# Patient Record
Sex: Female | Born: 1980 | Race: White | Hispanic: No | Marital: Married | Smoking: Never smoker
Health system: Southern US, Community
[De-identification: ages and names within clinical notes are randomized; demographics above are authoritative.]

## PROBLEM LIST (undated history)

## (undated) ENCOUNTER — Inpatient Hospital Stay: Payer: Self-pay

## (undated) DIAGNOSIS — Z8489 Family history of other specified conditions: Secondary | ICD-10-CM

## (undated) DIAGNOSIS — T8859XA Other complications of anesthesia, initial encounter: Secondary | ICD-10-CM

## (undated) DIAGNOSIS — J45909 Unspecified asthma, uncomplicated: Secondary | ICD-10-CM

## (undated) DIAGNOSIS — L723 Sebaceous cyst: Secondary | ICD-10-CM

## (undated) DIAGNOSIS — Z832 Family history of diseases of the blood and blood-forming organs and certain disorders involving the immune mechanism: Secondary | ICD-10-CM

## (undated) DIAGNOSIS — T4145XA Adverse effect of unspecified anesthetic, initial encounter: Secondary | ICD-10-CM

## (undated) DIAGNOSIS — G4730 Sleep apnea, unspecified: Secondary | ICD-10-CM

## (undated) DIAGNOSIS — D649 Anemia, unspecified: Secondary | ICD-10-CM

## (undated) DIAGNOSIS — E282 Polycystic ovarian syndrome: Secondary | ICD-10-CM

## (undated) DIAGNOSIS — E039 Hypothyroidism, unspecified: Secondary | ICD-10-CM

## (undated) DIAGNOSIS — R011 Cardiac murmur, unspecified: Secondary | ICD-10-CM

## (undated) DIAGNOSIS — K219 Gastro-esophageal reflux disease without esophagitis: Secondary | ICD-10-CM

## (undated) HISTORY — DX: Family history of diseases of the blood and blood-forming organs and certain disorders involving the immune mechanism: Z83.2

## (undated) HISTORY — DX: Sebaceous cyst: L72.3

## (undated) HISTORY — PX: WISDOM TOOTH EXTRACTION: SHX21

## (undated) HISTORY — DX: Morbid (severe) obesity due to excess calories: E66.01

## (undated) HISTORY — DX: Cardiac murmur, unspecified: R01.1

---

## 2008-12-25 HISTORY — PX: CYST EXCISION: SHX5701

## 2009-05-19 ENCOUNTER — Ambulatory Visit: Payer: Self-pay | Admitting: General Surgery

## 2009-05-27 ENCOUNTER — Ambulatory Visit: Payer: Self-pay | Admitting: General Surgery

## 2013-11-17 LAB — HM PAP SMEAR: HM Pap smear: NEGATIVE

## 2014-05-02 ENCOUNTER — Observation Stay: Payer: Self-pay | Admitting: Obstetrics and Gynecology

## 2014-05-04 ENCOUNTER — Encounter: Payer: Self-pay | Admitting: Obstetrics & Gynecology

## 2014-05-04 DIAGNOSIS — Z832 Family history of diseases of the blood and blood-forming organs and certain disorders involving the immune mechanism: Secondary | ICD-10-CM

## 2014-05-04 HISTORY — DX: Family history of diseases of the blood and blood-forming organs and certain disorders involving the immune mechanism: Z83.2

## 2014-06-26 ENCOUNTER — Observation Stay: Payer: Self-pay | Admitting: Emergency Medicine

## 2014-07-17 ENCOUNTER — Inpatient Hospital Stay: Payer: Self-pay

## 2014-07-17 LAB — PIH PROFILE
Anion Gap: 9 (ref 7–16)
BUN: 9 mg/dL (ref 7–18)
CALCIUM: 8.2 mg/dL — AB (ref 8.5–10.1)
CHLORIDE: 108 mmol/L — AB (ref 98–107)
CREATININE: 0.6 mg/dL (ref 0.60–1.30)
Co2: 22 mmol/L (ref 21–32)
EGFR (African American): >60
EGFR (Non-African Amer.): >60
GLUCOSE: 78 mg/dL (ref 65–99)
HCT: 32.7 % — ABNORMAL LOW (ref 35.0–47.0)
HGB: 10.8 g/dL — ABNORMAL LOW (ref 12.0–16.0)
MCH: 30.6 pg (ref 26.0–34.0)
MCHC: 33.1 g/dL (ref 32.0–36.0)
MCV: 93 fL (ref 80–100)
OSMOLALITY: 275 (ref 275–301)
Platelet: 225 10*3/uL (ref 150–440)
Potassium: 4 mmol/L (ref 3.5–5.1)
RBC: 3.53 10*6/uL — ABNORMAL LOW (ref 3.80–5.20)
RDW: 13.5 % (ref 11.5–14.5)
SGOT(AST): 15 U/L (ref 15–37)
SODIUM: 139 mmol/L (ref 136–145)
URIC ACID: 3.9 mg/dL (ref 2.6–6.0)
WBC: 12.6 10*3/uL — ABNORMAL HIGH (ref 3.6–11.0)

## 2014-07-17 LAB — PROTEIN / CREATININE RATIO, URINE
CREATININE, URINE: 129 mg/dL — AB (ref 30.0–125.0)
PROTEIN, RANDOM URINE: 42 mg/dL — AB (ref 0–12)
Protein/Creat. Ratio: 326 mg/gCREAT — ABNORMAL HIGH (ref 0–200)

## 2014-07-19 LAB — HEMATOCRIT: HCT: 30.2 % — AB (ref 35.0–47.0)

## 2015-04-06 ENCOUNTER — Encounter: Payer: Self-pay | Admitting: *Deleted

## 2015-05-04 NOTE — H&P (Signed)
L&D Evaluation:  History:  HPI [age] year old G1 at 2442w1d by D=7wk US derived EDC of 07/17/2014 presenting with postcoital spotting at 16:00 today, no further bleeding since.  Patient is Rh positive, posterior placenta, no pain with bleeding, positive fetal movement.  Denies contraction but some occasional cramping in the past few weeks.  No LOF.   Presents with vaginal bleeding   Patient's Medical History History of hypothyroidism   Patient's Surgical History cyst removal from head, wisdom tooth extraction   Medications Pre Natal Vitamins   Allergies PCN, Sulfa, levaquin   Social History none   Family History Non-Contributory   ROS:  ROS All systems were reviewed.  HEENT, CNS, GI, GU, Respiratory, CV, Renal and Musculoskeletal systems were found to be normal.   Exam:  Vital Signs stable   Urine Protein not completed   General no apparent distress   Mental Status clear   Chest no increased work of breathing   Abdomen gravid, non-tender   Estimated Fetal Weight Average for gestational age   Edema no edema   Pelvic no external lesions, cervix closed and thick, no blood on examining glove   Mebranes Intact   FHT 130, moderate, +accels, no decels   Ucx absent   Impression:  Impression 34 year old G1 at 6742w1d with ostcoital spotting   Plan:  Plan EFM/NST   Comments - reassuring fetal monitoring/reactive NST by <32 week criteria/category I tracing - no fundal tenderness - Rh positive rhogam not indicated - Follow up in place 05/04/14   Follow Up Appointment already scheduled   Electronic Signatures: Lorrene ReidStaebler, Lace Chenevert M (MD)  (Signed 09-May-15 19:17)  Authored: L&D Evaluation   Last Updated: 09-May-15 19:17 by Lorrene ReidStaebler, Adriaan Maltese M (MD)

## 2015-05-04 NOTE — H&P (Signed)
L&D Evaluation:  History:  HPI 34 year old G1 at 4357w0d by D=7wk US derived EDC of 07/17/2014 presenting with pwith contractions.  No LOF, no VB, positive fetal movement.   Presents with contractions   Patient's Medical History History of hypothyroidism   Patient's Surgical History cyst removal from head, wisdom tooth extraction   Medications Pre Natal Vitamins   Allergies PCN, Sulfa, levaquin   Social History none   Family History Non-Contributory   ROS:  ROS All systems were reviewed.  HEENT, CNS, GI, GU, Respiratory, CV, Renal and Musculoskeletal systems were found to be normal.   Exam:  Vital Signs stable   Urine Protein not completed   General no apparent distress   Mental Status clear   Chest no increased work of breathing   Abdomen gravid, non-tender   Estimated Fetal Weight Average for gestational age   Edema no edema   Pelvic no external lesions, 1/90/-3   Mebranes Intact   Ucx irregular, 5-437min   Impression:  Impression 34 year old G1 at 5357w0d with braxton hick contractions   Plan:  Plan EFM/NST   Comments - reassuring fetal monitoring/reactive NST  - No cervical change over prolonged rule out - follow up in place next week   Follow Up Appointment already scheduled   Electronic Signatures: Lorrene ReidStaebler, Chanika Byland M (MD)  (Signed 03-Jul-15 14:25)  Authored: L&D Evaluation   Last Updated: 03-Jul-15 14:25 by Lorrene ReidStaebler, Verenice Westrich M (MD)

## 2015-05-04 NOTE — H&P (Signed)
L&D Evaluation:  History Expanded:  HPI 34 yo G1 at 57w0dgestational age.  Her preggnancy has been complicated by obesity with BMI >40, she has a family history of hemophilia in her maternal grandfather and her sister's two sons have Christmas disease and hemophilia A.  She presents after being seen in the office today with elevated blood pressures in the mild range.  She has a questionable history of chronic hypertension (she had a BP of 142/80 at 15 weeks).  She has a mild headache, denies visual disturbances, and RUQ pain.  She notes positive fetal movement, no leakage of fluid, mild occasional contractions, and vaginal spotting after having her membranes swept in the office today.  [facility]   Blood Type (Maternal) A positive   Maternal HIV Negative   Maternal Syphilis Ab Nonreactive   Maternal Varicella Non-Immune   Rubella Results ([facility]aternal) immune   [facility]   Patient's Medical History Asthma   Patient's Surgical History Multiple cysts removed from head, wisdom teeth extraction    Medications Pre Natal Vitamins    Allergies PCN, Sulfa, levofloxacin   Social History none    Family History See HPI    ROS:  ROS All systems were reviewed.  HEENT, CNS, GI, GU, Respiratory, CV, Renal and Musculoskeletal systems were found to be normal., unless noted in HPI   Exam:  Vital Signs T98.4, BP 128-168 (single value >160)/ 78-97, P80, RR20    General no apparent distress   Mental Status clear    Chest clear    Heart normal sinus rhythm   Abdomen gravid, non-tender   Estimated Fetal Weight 7.5 pounds   Fetal Position cephalic   Edema no edema    Reflexes 2+    Pelvic 3/80/-2   Mebranes Ruptured   Description green/meconium   FHT normal rate with no decels   FHT Description 125/mod var/+accels/no decels   Ucx irregular   Impression:  Impression 1) Intrauterine pregnancy at 89w0destational age, 2)  preeclampsia without severe features   Plan:  Comments 1) Labor: active management with Induction of labor.  Patient had SROM with moderate meconium.  Will augment her labor with pitocin.   2) Fetus - category I tracing  3) PNL A negative / ABSC negative / RI / VZNI - vaccinate postpartum / HIV neg / RPR NR / HBsAg neg /declined genetic testing / early 1-hr OGTT 162 - 3 hour all values wnl, 3h gtt at 28 wks with all 3 values normal / GBS negative on 06/22/14.  She does have one nephew and one neice through her sister who have hemophilia B (factor IX deficiency, aka Christmas disease).  Patient seen by [facility] fetus has 1 in 8 chance of hemophilia B.   4) TDAP given 05/04/14  5) Disposition - home postpartum   Labs:  Lab Results:  Hepatic:  24-Jul-15 18:12   SGOT (AST) 15 (Result(s) reported on 17 Jul 2014 at [facility]  Routine Chem:  24-Jul-15 18:12   Uric Acid, Serum 3.9 (Result(s) reported on 17 Jul 2014 at [facility]  Glucose, Serum 78  BUN 9  Creatinine (comp) 0.60  Sodium, Serum 139  Potassium, Serum 4.0  Chloride, Serum  108  CO2, Serum 22  Osmolality (calc) 275  Anion Gap 9  Calcium (Total), Serum  8.2  eGFR (Non-African American) >60 (eGFR values <6051min/1.73 m2 may be an indication of chronic kidney disease (CKD). Calculated eGFR is useful in  patients with stable renal function. The eGFR calculation will not be reliable in acutely ill patients when serum creatinine is changing rapidly. It is not useful in  patients on dialysis. The eGFR calculation may not be applicable to patients at the low and high extremes of body sizes, pregnant women, and vegetarians.)  eGFR (African American) >60  Misc Urine Chem:  24-Jul-15 17:38   Creatinine, Urine  129.0  Protein, Random Urine  42  Protein/Creat Ratio (comp)  326 (Result(s) reported on 17 Jul 2014 at 07:36PM.)  Routine Hem:  24-Jul-15 18:12   WBC (CBC)  12.6  RBC (CBC)  3.53  Hemoglobin (CBC)  10.8  Hematocrit  (CBC)  32.7  Platelet Count (CBC) 225 (Result(s) reported on 17 Jul 2014 at [facility].)  MCV 93  MCH 30.6  MCHC 33.1  RDW 13.5   Electronic Signatures: Will Bonnet (MD)  (Signed 25-Jul-15 00:00)  Authored: L&D Evaluation, Labs   Last Updated: 25-Jul-15 00:00 by Will Bonnet (MD)

## 2015-12-20 ENCOUNTER — Emergency Department
Admission: EM | Admit: 2015-12-20 | Discharge: 2015-12-20 | Disposition: A | Discharge status: Home / Self Care | Payer: 59 | Attending: Emergency Medicine | Admitting: Emergency Medicine

## 2015-12-20 ENCOUNTER — Encounter: Payer: Self-pay | Admitting: Emergency Medicine

## 2015-12-20 DIAGNOSIS — L723 Sebaceous cyst: Secondary | ICD-10-CM | POA: Insufficient documentation

## 2015-12-20 DIAGNOSIS — Z79899 Other long term (current) drug therapy: Secondary | ICD-10-CM | POA: Diagnosis not present

## 2015-12-20 DIAGNOSIS — Z880 Allergy status to penicillin: Secondary | ICD-10-CM | POA: Insufficient documentation

## 2015-12-20 DIAGNOSIS — L089 Local infection of the skin and subcutaneous tissue, unspecified: Secondary | ICD-10-CM

## 2015-12-20 DIAGNOSIS — E039 Hypothyroidism, unspecified: Secondary | ICD-10-CM | POA: Diagnosis not present

## 2015-12-20 DIAGNOSIS — E282 Polycystic ovarian syndrome: Secondary | ICD-10-CM | POA: Insufficient documentation

## 2015-12-20 HISTORY — DX: Hypothyroidism, unspecified: E03.9

## 2015-12-20 HISTORY — DX: Unspecified asthma, uncomplicated: J45.909

## 2015-12-20 HISTORY — DX: Gastro-esophageal reflux disease without esophagitis: K21.9

## 2015-12-20 HISTORY — DX: Polycystic ovarian syndrome: E28.2

## 2015-12-20 MED ORDER — CLINDAMYCIN HCL 150 MG PO CAPS: 150.0000 mg | ORAL_CAPSULE | Freq: Four times a day (QID) | ORAL | Status: DC

## 2015-12-20 MED ORDER — OXYCODONE-ACETAMINOPHEN 5-325 MG PO TABS
2.0000 | ORAL_TABLET | Freq: Once | ORAL | Status: AC
  Administered 2015-12-20: 2 via ORAL
  Filled 2015-12-20: qty 2

## 2015-12-20 MED ORDER — OXYCODONE-ACETAMINOPHEN 7.5-325 MG PO TABS: 1.0000 | ORAL_TABLET | ORAL | Status: DC | PRN

## 2015-12-20 NOTE — Discharge Instructions (Signed)
Epidermal Cyst  An epidermal cyst is usually a small, painless lump under the skin. Cysts often occur on the face, neck, stomach, chest, or genitals. The cyst may be filled with a bad smelling paste. Do not pop your cyst. Popping the cyst can cause pain and puffiness (swelling).  HOME CARE   · Only take medicines as told by your doctor.  · Take your medicine (antibiotics) as told. Finish it even if you start to feel better.  GET HELP RIGHT AWAY IF:  · Your cyst is tender, red, or puffy.  · You are not getting better, or you are getting worse.  · You have any questions or concerns.  MAKE SURE YOU:  · Understand these instructions.  · Will watch your condition.  · Will get help right away if you are not doing well or get worse.     This information is not intended to replace advice given to you by your health care provider. Make sure you discuss any questions you have with your health care provider.     Document Released: 01/18/2005 Document Revised: 06/11/2012 Document Reviewed: 06/19/2011  Elsevier Interactive Patient Education ©2016 Elsevier Inc.

## 2015-12-20 NOTE — ED Notes (Signed)
Pt presents this am with 2 large cysts on the back of her head; the smaller one on top has been there for 6 years and "got infected a few months ago"; the larger one on the bottom has been there for 17 years and has been infected for a year; denies fever; pt says here this am due to pain

## 2015-12-20 NOTE — ED Provider Notes (Signed)
[facility] Emergency Department Provider Note  ____________________________________________  Time seen: Approximately 7:40 AM  I have reviewed the triage vital signs and the nursing notes.   HISTORY  Chief Complaint Cyst    HPI Kimberly Mcfarland is a 34 y.o. female patient with 2 large sebaceous cyst cervical lobe of his scalp. Patient state one lesion has been there for 6 years and the other lesion has been there for 17 years. Patient states she's been placed on antibiotics never to coming infected. Patient has a history of numerous sebaceous cyst removals. Patient has not contacted the treating surgeon for this complaint. Patient denies any discharge from these lesions. Patient rating her pain discomfort as a 8/10. Patient described the pain as pressure and sharp. No palliative measures taken for this complaint.  Past Medical History  Diagnosis Date  . Asthma   . PCOS (polycystic ovarian syndrome)   . Hypothyroid   . GERD (gastroesophageal reflux disease)     There are no active problems to display for this patient.   Past Surgical History  Procedure Laterality Date  . Cyst excision      Current Outpatient Rx  Name  Route  Sig  Dispense  Refill  . albuterol (PROVENTIL HFA;VENTOLIN HFA) 108 (90 BASE) MCG/ACT inhaler   Inhalation   Inhale 2 puffs into the lungs every 4 (four) hours as needed for wheezing or shortness of breath.         . ranitidine (ZANTAC) 150 MG tablet   Oral   Take 150 mg by mouth 2 (two) times daily.           Allergies Sulfa antibiotics; Levaquin; and Penicillins  History reviewed. No pertinent family history.  Social History Social History  Substance Use Topics  . Smoking status: Never Smoker   . Smokeless tobacco: None  . Alcohol Use: Yes    Review of Systems Constitutional: No fever/chills Eyes: No visual changes. ENT: No sore throat. Cardiovascular: Denies chest pain. Respiratory: Denies shortness of  breath. Gastrointestinal: No abdominal pain.  No nausea, no vomiting.  No diarrhea.  No constipation. Genitourinary: Negative for dysuria. Musculoskeletal: Negative for back pain. Skin: Negative for rash. Neurological: Negative for headaches, focal weakness or numbness. Endocrine: Hypothyroidism and polycystic ovarian syndrome. Allergic/Immunilogical: See medication list  10-point ROS otherwise negative.  ____________________________________________   PHYSICAL EXAM:  VITAL SIGNS: ED Triage Vitals  Enc Vitals Group     BP 12/20/15 0639 134/78 mmHg     Pulse Rate 12/20/15 0639 86     Resp 12/20/15 0639 18     Temp 12/20/15 0639 97.8 F (36.6 C)     Temp Source 12/20/15 0639 Oral     SpO2 12/20/15 0639 100 %     Weight 12/20/15 0639 246 lb (111.585 kg)     Height 12/20/15 0639 5\' 3"  (1.6 m)     Head Cir --      Peak Flow --      Pain Score 12/20/15 0641 8     Pain Loc --      Pain Edu? --      Excl. in GC? --     Constitutional: Alert and oriented. Well appearing and in no acute distress. Eyes: Conjunctivae are normal. PERRL. EOMI. Head: Atraumatic. Nose: No congestion/rhinnorhea. Mouth/Throat: Mucous membranes are moist.  Oropharynx non-erythematous. Neck: No stridor.  No cervical spine tenderness to palpation. Hematological/Lymphatic/Immunilogical: No cervical lymphadenopathy. Cardiovascular: Normal rate, regular rhythm. Grossly normal heart sounds.  Good peripheral circulation. Respiratory: Normal respiratory effort.  No retractions. Lungs CTAB. Gastrointestinal: Soft and nontender. No distention. No abdominal bruits. No CVA tenderness. Musculoskeletal: No lower extremity tenderness nor edema.  No joint effusions. Neurologic:  Normal speech and language. No gross focal neurologic deficits are appreciated. No gait instability. Skin:  Skin is warm, dry and intact. No rash noted. 2 nodular lesion occipital area of scalp Psychiatric: Mood and affect are normal. Speech and  behavior are normal.  ____________________________________________   LABS (all labs ordered are listed, but only abnormal results are displayed)  Labs Reviewed - No data to display ____________________________________________  EKG   ____________________________________________  RADIOLOGY   ____________________________________________   PROCEDURES  Procedure(s) performed: None  Critical Care performed: No  ____________________________________________   INITIAL IMPRESSION / ASSESSMENT AND PLAN / ED COURSE  Pertinent labs & imaging results that were available during my care of the patient were reviewed by me and considered in my medical decision making (see chart for details).  Sebaceous cyst of scalp.f the scalp. Patient given a prescription for clindamycin and Percocets. Patient advised to call surgical clinic tomorrow morning for appointment. ____________________________________________   FINAL CLINICAL IMPRESSION(S) / ED DIAGNOSES  Final diagnoses:  Infected sebaceous cyst of skin      Joni Reining, PA-C 12/20/15 0745  Jene Every, MD 12/20/15 [phone]

## 2015-12-23 ENCOUNTER — Other Ambulatory Visit: Payer: Self-pay | Admitting: Obstetrics and Gynecology

## 2015-12-23 DIAGNOSIS — N63 Unspecified lump in unspecified breast: Secondary | ICD-10-CM

## 2016-01-05 ENCOUNTER — Ambulatory Visit
Admission: RE | Admit: 2016-01-05 | Discharge: 2016-01-05 | Disposition: A | Discharge status: Home / Self Care | Payer: 59 | Source: Ambulatory Visit | Attending: Obstetrics and Gynecology | Admitting: Obstetrics and Gynecology

## 2016-01-05 DIAGNOSIS — N63 Unspecified lump in unspecified breast: Secondary | ICD-10-CM

## 2016-01-06 ENCOUNTER — Ambulatory Visit (INDEPENDENT_AMBULATORY_CARE_PROVIDER_SITE_OTHER): Payer: 59 | Admitting: General Surgery

## 2016-01-06 ENCOUNTER — Encounter: Payer: Self-pay | Admitting: General Surgery

## 2016-01-06 VITALS — BP 132/82 | HR 80 | Resp 14 | Ht 63.0 in | Wt 247.0 lb

## 2016-01-06 DIAGNOSIS — L729 Follicular cyst of the skin and subcutaneous tissue, unspecified: Secondary | ICD-10-CM | POA: Diagnosis not present

## 2016-01-06 NOTE — Patient Instructions (Addendum)
The patient is aware to call back for any questions or concerns.  Patient[facility].

## 2016-01-06 NOTE — Progress Notes (Signed)
Patient ID: Kimberly Mcfarland, female   DOB: 20-May-1981, 35 y.o.   MRN: [id number]  Chief Complaint  Patient presents with  . Other    cyst    HPI Kimberly Mcfarland is a 35 y.o. female.  Here today for evaluation of large cyst on her scalp. She noticed this about 17 years ago. She has had cyst removed about 10 years ago. She states it has gotten larger over the past year. She states there is 2 in her scalp area. No drainage. She states the ED gave her some antibiotics 12-20-15 but she did not take them. I have reviewed the history of present illness with the patient. HPI  Past Medical History  Diagnosis Date  . Asthma   . PCOS (polycystic ovarian syndrome)   . Hypothyroid   . GERD (gastroesophageal reflux disease)   . Breast mass x 1  month    about a 1 cm mass Left at 2 o'clock per pt    Past Surgical History  Procedure Laterality Date  . Cyst excision  2010    History reviewed. No pertinent family history.  Social History Social History  Substance Use Topics  . Smoking status: Never Smoker   . Smokeless tobacco: Never Used  . Alcohol Use: 0.0 oz/week    0 Standard drinks or equivalent per week     Comment: wine occasionally    Allergies  Allergen Reactions  . Sulfa Antibiotics Other (See Comments)    seizures  . Levaquin [Levofloxacin In D5w] Hives  . Penicillins Hives    Current Outpatient Prescriptions  Medication Sig Dispense Refill  . albuterol (PROVENTIL HFA;VENTOLIN HFA) 108 (90 BASE) MCG/ACT inhaler Inhale 2 puffs into the lungs every 4 (four) hours as needed for wheezing or shortness of breath.    . ranitidine (ZANTAC) 150 MG tablet Take 150 mg by mouth 2 (two) times daily.     No current facility-administered medications for this visit.    Review of Systems Review of Systems  Constitutional: Negative.   Respiratory: Negative.   Cardiovascular: Negative.     Blood pressure 132/82, pulse 80, resp. rate 14, height 5\' 3"  (1.6 m), weight 247 lb (112.038  kg), last menstrual period 12/12/2015.  Physical Exam Physical Exam  Constitutional: She is oriented to person, place, and time. She appears well-developed and well-nourished.  HENT:  Mouth/Throat: Oropharynx is clear and moist.  Under occipital area 6-7 cm cyst and also over the right posterior parietal area a 5 cm cyst  Eyes: Conjunctivae are normal. No scleral icterus.  Neck: Neck supple.  Cardiovascular: Normal rate, regular rhythm and normal heart sounds.   Pulmonary/Chest: Effort normal and breath sounds normal.  Lymphadenopathy:    She has no cervical adenopathy.  Neurological: She is alert and oriented to person, place, and time.  Skin: Skin is warm and dry.  Psychiatric: Her behavior is normal.    Data Reviewed Progress notes.  Assessment    Scalp cyst-multiple. 2 very large cysts. Best dealt with by excision. This is also best done under anesthesia     Plan    Discussed risk and benefits of excision of the scalp cyst in detail, patient agrees.  Patient's [facility]16[facility].     PCP:  No Pcp  This information has been scribed by Dorathy DaftMarsha Hatch RNBC.   Keneisha Heckart G 01/06/2016, 11:37 AM

## 2016-01-11 ENCOUNTER — Other Ambulatory Visit: Payer: Self-pay | Admitting: Obstetrics and Gynecology

## 2016-01-11 DIAGNOSIS — N63 Unspecified lump in unspecified breast: Secondary | ICD-10-CM

## 2016-01-29 ENCOUNTER — Emergency Department
Admission: EM | Admit: 2016-01-29 | Discharge: 2016-01-29 | Disposition: A | Discharge status: Home / Self Care | Payer: 59 | Attending: Emergency Medicine | Admitting: Emergency Medicine

## 2016-01-29 ENCOUNTER — Encounter: Payer: Self-pay | Admitting: Emergency Medicine

## 2016-01-29 ENCOUNTER — Emergency Department: Payer: 59

## 2016-01-29 DIAGNOSIS — Z79899 Other long term (current) drug therapy: Secondary | ICD-10-CM | POA: Diagnosis not present

## 2016-01-29 DIAGNOSIS — R079 Chest pain, unspecified: Secondary | ICD-10-CM | POA: Diagnosis not present

## 2016-01-29 DIAGNOSIS — Z880 Allergy status to penicillin: Secondary | ICD-10-CM | POA: Insufficient documentation

## 2016-01-29 DIAGNOSIS — R002 Palpitations: Secondary | ICD-10-CM | POA: Insufficient documentation

## 2016-01-29 DIAGNOSIS — R0789 Other chest pain: Secondary | ICD-10-CM | POA: Diagnosis not present

## 2016-01-29 LAB — COMPREHENSIVE METABOLIC PANEL
ALT: 17 U/L (ref 14–54)
AST: 29 U/L (ref 15–41)
Albumin: 3.8 g/dL (ref 3.5–5.0)
Alkaline Phosphatase: 72 U/L (ref 38–126)
Anion gap: 6 (ref 5–15)
BUN: 13 mg/dL (ref 6–20)
CHLORIDE: 105 mmol/L (ref 101–111)
CO2: 29 mmol/L (ref 22–32)
CREATININE: 0.77 mg/dL (ref 0.44–1.00)
Calcium: 8.7 mg/dL — ABNORMAL LOW (ref 8.9–10.3)
GFR calc Af Amer: >60 mL/min (ref >60)
GLUCOSE: 141 mg/dL — AB (ref 65–99)
Potassium: 4 mmol/L (ref 3.5–5.1)
SODIUM: 140 mmol/L (ref 135–145)
Total Bilirubin: 0.6 mg/dL (ref 0.3–1.2)
Total Protein: 7.4 g/dL (ref 6.5–8.1)

## 2016-01-29 LAB — TROPONIN I: Troponin I: <0.03 ng/mL (ref <0.031)

## 2016-01-29 LAB — LIPASE, BLOOD: Lipase: 18 U/L (ref 11–51)

## 2016-01-29 LAB — CBC WITH DIFFERENTIAL/PLATELET
Basophils Absolute: 0.1 10*3/uL (ref 0–0.1)
Basophils Relative: 1 %
EOS PCT: 1 %
Eosinophils Absolute: 0.2 10*3/uL (ref 0–0.7)
HCT: 38.4 % (ref 35.0–47.0)
Hemoglobin: 12.8 g/dL (ref 12.0–16.0)
LYMPHS ABS: 2.3 10*3/uL (ref 1.0–3.6)
LYMPHS PCT: 15 %
MCH: 27.7 pg (ref 26.0–34.0)
MCHC: 33.3 g/dL (ref 32.0–36.0)
MCV: 83.1 fL (ref 80.0–100.0)
MONO ABS: 0.7 10*3/uL (ref 0.2–0.9)
Monocytes Relative: 5 %
Neutro Abs: 12.2 10*3/uL — ABNORMAL HIGH (ref 1.4–6.5)
Neutrophils Relative %: 78 %
PLATELETS: 335 10*3/uL (ref 150–440)
RBC: 4.62 MIL/uL (ref 3.80–5.20)
RDW: 14 % (ref 11.5–14.5)
WBC: 15.5 10*3/uL — ABNORMAL HIGH (ref 3.6–11.0)

## 2016-01-29 NOTE — ED Notes (Signed)
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    Pt appears anxious but cooperative.  Denies SOB, N/V, HA, diaphoresis.

## 2016-01-29 NOTE — Discharge Instructions (Signed)
Return to the ER for recurrent or worsening symptoms, persistent vomiting, difficulty breathing or other concerns.  Nonspecific Chest Pain  Chest pain can be caused by many different conditions. There is always a chance that your pain could be related to something serious, such as a heart attack or a blood clot in your lungs. Chest pain can also be caused by conditions that are not life-threatening. If you have chest pain, it is very important to follow up with your health care provider. CAUSES  Chest pain can be caused by:  Heartburn.  Pneumonia or bronchitis.  Anxiety or stress.  Inflammation around your heart (pericarditis) or lung (pleuritis or pleurisy).  A blood clot in your lung.  A collapsed lung (pneumothorax). It can develop suddenly on its own (spontaneous pneumothorax) or from trauma to the chest.  Shingles infection (varicella-zoster virus).  Heart attack.  Damage to the bones, muscles, and cartilage that make up your chest wall. This can include:  Bruised bones due to injury.  Strained muscles or cartilage due to frequent or repeated coughing or overwork.  Fracture to one or more ribs.  Sore cartilage due to inflammation (costochondritis). RISK FACTORS  Risk factors for chest pain may include:  Activities that increase your risk for trauma or injury to your chest.  Respiratory infections or conditions that cause frequent coughing.  Medical conditions or overeating that can cause heartburn.  Heart disease or family history of heart disease.  Conditions or health behaviors that increase your risk of developing a blood clot.  Having had chicken pox (varicella zoster). SIGNS AND SYMPTOMS Chest pain can feel like:  Burning or tingling on the surface of your chest or deep in your chest.  Crushing, pressure, aching, or squeezing pain.  Dull or sharp pain that is worse when you move, cough, or take a deep breath.  Pain that is also felt in your back, neck,  shoulder, or arm, or pain that spreads to any of these areas. Your chest pain may come and go, or it may stay constant. DIAGNOSIS Lab tests or other studies may be needed to find the cause of your pain. Your health care provider may have you take a test called an ambulatory ECG (electrocardiogram). An ECG records your heartbeat patterns at the time the test is performed. You may also have other tests, such as:  Transthoracic echocardiogram (TTE). During echocardiography, sound waves are used to create a picture of all of the heart structures and to look at how blood flows through your heart.  Transesophageal echocardiogram (TEE).This is a more advanced imaging test that obtains images from inside your body. It allows your health care provider to see your heart in finer detail.  Cardiac monitoring. This allows your health care provider to monitor your heart rate and rhythm in real time.  Holter monitor. This is a portable device that records your heartbeat and can help to diagnose abnormal heartbeats. It allows your health care provider to track your heart activity for several days, if needed.  Stress tests. These can be done through exercise or by taking medicine that makes your heart beat more quickly.  Blood tests.  Imaging tests. TREATMENT  Your treatment depends on what is causing your chest pain. Treatment may include:  Medicines. These may include:  Acid blockers for heartburn.  Anti-inflammatory medicine.  Pain medicine for inflammatory conditions.  Antibiotic medicine, if an infection is present.  Medicines to dissolve blood clots.  Medicines to treat coronary artery disease.  Supportive care for conditions that do not require medicines. This may include:  Resting.  Applying heat or cold packs to injured areas.  Limiting activities until pain decreases. HOME CARE INSTRUCTIONS  If you were prescribed an antibiotic medicine, finish it all even if you start to feel  better.  Avoid any activities that bring on chest pain.  Do not use any tobacco products, including cigarettes, chewing tobacco, or electronic cigarettes. If you need help quitting, ask your health care provider.  Do not drink alcohol.  Take medicines only as directed by your health care provider.  Keep all follow-up visits as directed by your health care provider. This is important. This includes any further testing if your chest pain does not go away.  If heartburn is the cause for your chest pain, you may be told to keep your head raised (elevated) while sleeping. This reduces the chance that acid will go from your stomach into your esophagus.  Make lifestyle changes as directed by your health care provider. These may include:  Getting regular exercise. Ask your health care provider to suggest some activities that are safe for you.  Eating a heart-healthy diet. A registered dietitian can help you to learn healthy eating options.  Maintaining a healthy weight.  Managing diabetes, if necessary.  Reducing stress. SEEK MEDICAL CARE IF:  Your chest pain does not go away after treatment.  You have a rash with blisters on your chest.  You have a fever. SEEK IMMEDIATE MEDICAL CARE IF:   Your chest pain is worse.  You have an increasing cough, or you cough up blood.  You have severe abdominal pain.  You have severe weakness.  You faint.  You have chills.  You have sudden, unexplained chest discomfort.  You have sudden, unexplained discomfort in your arms, back, neck, or jaw.  You have shortness of breath at any time.  You suddenly start to sweat, or your skin gets clammy.  You feel nauseous or you vomit.  You suddenly feel light-headed or dizzy.  Your heart begins to beat quickly, or it feels like it is skipping beats. These symptoms may represent a serious problem that is an emergency. Do not wait to see if the symptoms will go away. Get medical help right away.  Call your local emergency services (911 in the [place] Do not drive yourself to the hospital.   This information is not intended to replace advice given to you by your health care provider. Make sure you discuss any questions you have with your health care provider.   Document Released: 09/20/2005 Document Revised: 01/01/2015 Document Reviewed: 07/17/2014 Elsevier Interactive Patient Education [employer].

## 2016-01-29 NOTE — ED Provider Notes (Signed)
[facility] Emergency Department Provider Note  ____________________________________________  Time seen: Approximately 4:15 AM  I have reviewed the triage vital signs and the nursing notes.   HISTORY  Chief Complaint Chest Pain    HPI Kimberly Mcfarland is a 35 y.o. 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 Patient reports chest pressure onset after eating a cheeseburger; lasting off/on x one hour prior to arrival. Symptomsnot associated with diaphoresis, shortness of breath, nausea or vomiting. Patient did feel some palpitations with onset of chest discomfort. Denies recent fever, chills, abdominal pain, dysuria, diarrhea. Denies recent travel or trauma. Denies use of hormones, including OCPs. Nothing made her pain worse. Burping and ginger ale resolved her pain.   Past Medical History  Diagnosis Date  . Asthma   . PCOS (polycystic ovarian syndrome)   . Hypothyroid   . GERD (gastroesophageal reflux disease)   . Breast mass x 1  month    about a 1 cm mass Left at 2 o'clock per pt    There are no active problems to display for this patient.   Past Surgical History  Procedure Laterality Date  . Cyst excision  2010  . Wisdom tooth extraction      Current Outpatient Rx  Name  Route  Sig  Dispense  Refill  . albuterol (PROVENTIL HFA;VENTOLIN HFA) 108 (90 BASE) MCG/ACT inhaler   Inhalation   Inhale 2 puffs into the lungs every 4 (four) hours as needed for wheezing or shortness of breath.         . ranitidine (ZANTAC) 150 MG tablet   Oral   Take 150 mg by mouth 2 (two) times daily.           Allergies Sulfa antibiotics; Levaquin; and Penicillins  History reviewed. No pertinent family history.  Social History Social History  Substance Use Topics  . Smoking status: Never Smoker   . Smokeless tobacco: Never Used  . Alcohol Use: 0.0 oz/week    0 Standard  drinks or equivalent per week     Comment: wine occasionally    Review of Systems Constitutional: No fever/chills Eyes: No visual changes. ENT: No sore throat. Cardiovascular: Positive for chest pain. Respiratory: Denies shortness of breath. Gastrointestinal: No abdominal pain.  No nausea, no vomiting.  No diarrhea.  No constipation. Genitourinary: Negative for dysuria. Musculoskeletal: Negative for back pain. Skin: Negative for rash. Neurological: Negative for headaches, focal weakness or numbness.  10-point ROS otherwise negative.  ____________________________________________   PHYSICAL EXAM:  VITAL SIGNS: ED Triage Vitals  Enc Vitals Group     BP 01/29/16 0324 130/86 mmHg     Pulse Rate 01/29/16 0324 108     Resp 01/29/16 0324 19     Temp 01/29/16 0324 98.2 F (36.8 C)     Temp Source 01/29/16 0324 Oral     SpO2 01/29/16 0324 100 %     Weight 01/29/16 0324 247 lb (112.038 kg)     Height 01/29/16 0324  (1.6 m)     Head Cir --      Peak Flow --      Pain Score --      Pain Loc --      Pain Edu? --      Excl. in GC? --     Constitutional: Alert and oriented. Well appearing and in no acute distress. Eyes: Conjunctivae are normal.  PERRL. EOMI. Head: Atraumatic. Nose: No congestion/rhinnorhea. Mouth/Throat: Mucous membranes are moist.  Oropharynx non-erythematous. Neck: No stridor.   Cardiovascular: Normal rate, regular rhythm. Grossly normal heart sounds.  Good peripheral circulation. Respiratory: Normal respiratory effort.  No retractions. Lungs CTAB. Gastrointestinal: Soft and nontender to light or deep palpation. No distention. No abdominal bruits. No CVA tenderness. Musculoskeletal: No lower extremity tenderness nor edema.  No joint effusions. Neurologic:  Normal speech and language. No gross focal neurologic deficits are appreciated. No gait instability. Skin:  Skin is warm, dry and intact. No rash noted. Psychiatric: Mood and affect are normal. Speech  and behavior are normal.  ____________________________________________   LABS (all labs ordered are listed, but only abnormal results are displayed)  Labs Reviewed  CBC WITH DIFFERENTIAL/PLATELET  COMPREHENSIVE METABOLIC PANEL  LIPASE, BLOOD  TROPONIN I   ____________________________________________  EKG  ED ECG REPORT I, SUNG,JADE J, the attending physician, personally viewed and interpreted this ECG.   Date: 01/29/2016  EKG Time: 0323  Rate: 106  Rhythm: sinus tachycardia  Axis: Normal  Intervals:none  ST&T Change: Nonspecific  ____________________________________________  RADIOLOGY  Chest 2 view (viewed by me, interpreted per Dr. Grace Isaac): Normal chest. ____________________________________________   PROCEDURES  Procedure(s) performed: None  Critical Care performed: No  ____________________________________________   INITIAL IMPRESSION / ASSESSMENT AND PLAN / ED COURSE  Pertinent labs & imaging results that were available during my care of the patient were reviewed by me and considered in my medical decision making (see chart for details).  35 year old female who presents for chest pain after eating cheeseburger. Pain completely resolved after burping and drinking ginger ale. Patient is currently resting comfortably without pain. Will obtain screening lab work including LFTs, lipase and troponin. Will reassess.  ----------------------------------------- 5:11 AM on 01/29/2016 -----------------------------------------  Updated patient and spouse of laboratory and imaging results. She is low risk for ACS as well as for PE. Advised patient to continue Zantac as needed for GERD and to follow-up with her PCP early next week. Strict return precautions given. Both verbalize understanding and agree with plan of care. ____________________________________________   FINAL CLINICAL IMPRESSION(S) / ED DIAGNOSES  Final diagnoses:  Chest pain, unspecified chest pain  type      Irean Hong, MD 01/29/16 [phone]

## 2016-01-31 ENCOUNTER — Other Ambulatory Visit: Payer: Self-pay | Admitting: General Surgery

## 2016-01-31 DIAGNOSIS — L728 Other follicular cysts of the skin and subcutaneous tissue: Secondary | ICD-10-CM

## 2016-02-03 DIAGNOSIS — Z01810 Encounter for preprocedural cardiovascular examination: Secondary | ICD-10-CM | POA: Diagnosis not present

## 2016-02-03 DIAGNOSIS — R079 Chest pain, unspecified: Secondary | ICD-10-CM | POA: Diagnosis not present

## 2016-02-08 ENCOUNTER — Encounter
Admission: RE | Admit: 2016-02-08 | Discharge: 2016-02-08 | Disposition: A | Discharge status: Home / Self Care | Payer: 59 | Source: Ambulatory Visit | Attending: General Surgery | Admitting: General Surgery

## 2016-02-08 DIAGNOSIS — Z01818 Encounter for other preprocedural examination: Secondary | ICD-10-CM | POA: Insufficient documentation

## 2016-02-08 HISTORY — DX: Other complications of anesthesia, initial encounter: T88.59XA

## 2016-02-08 HISTORY — DX: Adverse effect of unspecified anesthetic, initial encounter: T41.45XA

## 2016-02-08 NOTE — Patient Instructions (Signed)
  Your procedure is scheduled on: 02/16/16 Report to Day Surgery. [facility]LO[facility] To find out your arrival time please call [phone] between 1PM - 3PM on        02/15/16  Remember: Instructions that are not followed completely may result in serious medical risk, up to and including death, or upon the discretion of your surgeon and anesthesiologist your surgery may need to be rescheduled.    __X__ 1. Do not eat food or drink liquids after midnight. No gum chewing or hard candies.     __X__ 2. No Alcohol for 24 hours before or after surgery.   ____ 3. Bring all medications with you on the day of surgery if instructed.    _X___ 4. Notify your doctor if there is any change in your medical condition     (cold, fever, infections).     Do not wear jewelry, make-up, hairpins, clips or nail polish.  Do not wear lotions, powders, or perfumes. You may wear deodorant.  Do not shave 48 hours prior to surgery. Men may shave face and neck.  Do not bring valuables to the hospital.    [facility] is not responsible for any belongings or valuables.               Contacts, dentures or bridgework may not be worn into surgery.  Leave your suitcase in the car. After surgery it may be brought to your room.  For patients admitted to the hospital, discharge time is determined by your                treatment team.   Patients discharged the day of surgery will not be allowed to drive home.   Please read over the following fact sheets that you were given:   Surgical Site Infection Prevention   ____ Take these medicines the morning of surgery with A SIP OF WATER:    1. ZANTAC  2.   3.   4.  5.  6.  ____ Fleet Enema (as directed)   ____ Use CHG Soap as directed  _X___ Use inhalers on the day of surgery  ____ Stop metformin 2 days prior to surgery    ____ Take 1/2 of usual insulin dose the night before surgery and none on the morning of surgery.   ____ Stop Coumadin/Plavix/aspirin on   __X_ Stop Anti-inflammatories on       NOW UNTIL AFTER SURGERY   ____ Stop supplements until after surgery.    ____ Bring C-Pap to the hospital.

## 2016-02-16 ENCOUNTER — Ambulatory Visit: Payer: 59 | Admitting: *Deleted

## 2016-02-16 ENCOUNTER — Encounter
Admission: RE | Disposition: A | Discharge status: Home / Self Care | Payer: Self-pay | Source: Ambulatory Visit | Attending: General Surgery

## 2016-02-16 ENCOUNTER — Encounter: Payer: Self-pay | Admitting: *Deleted

## 2016-02-16 ENCOUNTER — Ambulatory Visit
Admission: RE | Admit: 2016-02-16 | Discharge: 2016-02-16 | Disposition: A | Discharge status: Home / Self Care | Payer: 59 | Source: Ambulatory Visit | Attending: General Surgery | Admitting: General Surgery

## 2016-02-16 DIAGNOSIS — J45909 Unspecified asthma, uncomplicated: Secondary | ICD-10-CM | POA: Diagnosis not present

## 2016-02-16 DIAGNOSIS — L7211 Pilar cyst: Secondary | ICD-10-CM | POA: Insufficient documentation

## 2016-02-16 DIAGNOSIS — Z880 Allergy status to penicillin: Secondary | ICD-10-CM | POA: Diagnosis not present

## 2016-02-16 DIAGNOSIS — L728 Other follicular cysts of the skin and subcutaneous tissue: Secondary | ICD-10-CM

## 2016-02-16 DIAGNOSIS — Z882 Allergy status to sulfonamides status: Secondary | ICD-10-CM | POA: Insufficient documentation

## 2016-02-16 DIAGNOSIS — K219 Gastro-esophageal reflux disease without esophagitis: Secondary | ICD-10-CM | POA: Diagnosis not present

## 2016-02-16 DIAGNOSIS — Z7951 Long term (current) use of inhaled steroids: Secondary | ICD-10-CM | POA: Insufficient documentation

## 2016-02-16 DIAGNOSIS — Z79899 Other long term (current) drug therapy: Secondary | ICD-10-CM | POA: Diagnosis not present

## 2016-02-16 DIAGNOSIS — E039 Hypothyroidism, unspecified: Secondary | ICD-10-CM | POA: Diagnosis not present

## 2016-02-16 DIAGNOSIS — L729 Follicular cyst of the skin and subcutaneous tissue, unspecified: Secondary | ICD-10-CM | POA: Diagnosis not present

## 2016-02-16 DIAGNOSIS — L7212 Trichodermal cyst: Secondary | ICD-10-CM | POA: Diagnosis not present

## 2016-02-16 DIAGNOSIS — L723 Sebaceous cyst: Secondary | ICD-10-CM | POA: Diagnosis not present

## 2016-02-16 DIAGNOSIS — Z888 Allergy status to other drugs, medicaments and biological substances status: Secondary | ICD-10-CM | POA: Insufficient documentation

## 2016-02-16 HISTORY — PX: LESION EXCISION: SHX5167

## 2016-02-16 LAB — POCT PREGNANCY, URINE: Preg Test, Ur: NEGATIVE

## 2016-02-16 SURGERY — EXCISION, LESION, SCALP
Anesthesia: General | Site: Head | Wound class: Clean Contaminated

## 2016-02-16 MED ORDER — FENTANYL CITRATE (PF) 100 MCG/2ML IJ SOLN
INTRAMUSCULAR | Status: DC | PRN
  Administered 2016-02-16 (×2): 25 ug via INTRAVENOUS
  Administered 2016-02-16: 100 ug via INTRAVENOUS
  Administered 2016-02-16: 25 ug via INTRAVENOUS

## 2016-02-16 MED ORDER — SUCCINYLCHOLINE CHLORIDE 20 MG/ML IJ SOLN
INTRAMUSCULAR | Status: DC | PRN
  Administered 2016-02-16: 120 mg via INTRAVENOUS

## 2016-02-16 MED ORDER — BUPIVACAINE-EPINEPHRINE (PF) 0.5% -1:200000 IJ SOLN
INTRAMUSCULAR | Status: AC
  Filled 2016-02-16: qty 30

## 2016-02-16 MED ORDER — MIDAZOLAM HCL 2 MG/2ML IJ SOLN
INTRAMUSCULAR | Status: DC | PRN
Start: 2016-02-16 — End: 2016-02-16
  Administered 2016-02-16: 2 mg via INTRAVENOUS

## 2016-02-16 MED ORDER — NEOSTIGMINE METHYLSULFATE 10 MG/10ML IV SOLN
INTRAVENOUS | Status: DC | PRN
  Administered 2016-02-16: 3 mg via INTRAVENOUS

## 2016-02-16 MED ORDER — FAMOTIDINE 20 MG PO TABS
ORAL_TABLET | ORAL | Status: AC
  Administered 2016-02-16: 20 mg
  Filled 2016-02-16: qty 1

## 2016-02-16 MED ORDER — PROPOFOL 10 MG/ML IV BOLUS
INTRAVENOUS | Status: DC | PRN
  Administered 2016-02-16: 200 mg via INTRAVENOUS

## 2016-02-16 MED ORDER — FENTANYL CITRATE (PF) 100 MCG/2ML IJ SOLN
INTRAMUSCULAR | Status: AC
  Administered 2016-02-16: 25 ug via INTRAVENOUS
  Filled 2016-02-16: qty 2

## 2016-02-16 MED ORDER — BACITRACIN ZINC 500 UNIT/GM EX OINT
TOPICAL_OINTMENT | CUTANEOUS | Status: DC | PRN
  Administered 2016-02-16: 1 via TOPICAL

## 2016-02-16 MED ORDER — CHLORHEXIDINE GLUCONATE 4 % EX LIQD: 1.0000 "application " | Freq: Once | CUTANEOUS | Status: DC

## 2016-02-16 MED ORDER — HYDROCODONE-ACETAMINOPHEN 5-325 MG PO TABS
1.0000 | ORAL_TABLET | Freq: Four times a day (QID) | ORAL | Status: DC | PRN
  Administered 2016-02-16: 1 via ORAL

## 2016-02-16 MED ORDER — LACTATED RINGERS IV SOLN
INTRAVENOUS | Status: DC
  Administered 2016-02-16: 75 mL/h via INTRAVENOUS

## 2016-02-16 MED ORDER — PROMETHAZINE HCL 25 MG/ML IJ SOLN: 6.2500 mg | INTRAMUSCULAR | Status: DC | PRN

## 2016-02-16 MED ORDER — GLYCOPYRROLATE 0.2 MG/ML IJ SOLN
INTRAMUSCULAR | Status: DC | PRN
  Administered 2016-02-16: 0.6 mg via INTRAVENOUS

## 2016-02-16 MED ORDER — ONDANSETRON HCL 4 MG/2ML IJ SOLN
INTRAMUSCULAR | Status: DC | PRN
  Administered 2016-02-16: 4 mg via INTRAVENOUS

## 2016-02-16 MED ORDER — BACITRACIN ZINC 500 UNIT/GM EX OINT
TOPICAL_OINTMENT | CUTANEOUS | Status: AC
  Filled 2016-02-16: qty 28.35

## 2016-02-16 MED ORDER — FENTANYL CITRATE (PF) 100 MCG/2ML IJ SOLN
25.0000 ug | INTRAMUSCULAR | Status: AC | PRN
  Administered 2016-02-16 (×6): 25 ug via INTRAVENOUS

## 2016-02-16 MED ORDER — LACTATED RINGERS IV SOLN
INTRAVENOUS | Status: DC | PRN
  Administered 2016-02-16: 10:00:00 via INTRAVENOUS

## 2016-02-16 MED ORDER — HYDROCODONE-ACETAMINOPHEN 5-325 MG PO TABS: 1.0000 | ORAL_TABLET | Freq: Four times a day (QID) | ORAL | Status: DC | PRN

## 2016-02-16 MED ORDER — HYDROCODONE-ACETAMINOPHEN 5-325 MG PO TABS
ORAL_TABLET | ORAL | Status: AC
  Filled 2016-02-16: qty 1

## 2016-02-16 MED ORDER — LIDOCAINE HCL (CARDIAC) 20 MG/ML IV SOLN
INTRAVENOUS | Status: DC | PRN
  Administered 2016-02-16: 20 mg via INTRAVENOUS

## 2016-02-16 SURGICAL SUPPLY — 25 items
BLADE SURG 15 STRL SS SAFETY (BLADE) ×4 IMPLANT
CANISTER SUCT 1200ML W/VALVE (MISCELLANEOUS) ×2 IMPLANT
CHLORAPREP W/TINT 26ML (MISCELLANEOUS) ×2 IMPLANT
CNTNR SPEC 2.5X3XGRAD LEK (MISCELLANEOUS) ×1
CONT SPEC 4OZ STER OR WHT (MISCELLANEOUS) ×1
CONTAINER SPEC 2.5X3XGRAD LEK (MISCELLANEOUS) ×1 IMPLANT
DRAPE LAPAROTOMY 77X122 PED (DRAPES) ×2 IMPLANT
ELECT REM PT RETURN 9FT ADLT (ELECTROSURGICAL) ×2
ELECTRODE REM PT RTRN 9FT ADLT (ELECTROSURGICAL) ×1 IMPLANT
GLOVE BIO SURGEON STRL SZ7 (GLOVE) ×2 IMPLANT
GLOVE BIOGEL M 7.0 STRL (GLOVE) ×4 IMPLANT
GOWN STRL REUS W/ TWL LRG LVL3 (GOWN DISPOSABLE) ×2 IMPLANT
GOWN STRL REUS W/TWL LRG LVL3 (GOWN DISPOSABLE) ×2
KIT RM TURNOVER STRD PROC AR (KITS) ×2 IMPLANT
LABEL OR SOLS (LABEL) ×2 IMPLANT
NEEDLE HYPO 25X1 1.5 SAFETY (NEEDLE) ×2 IMPLANT
NS IRRIG 500ML POUR BTL (IV SOLUTION) ×2 IMPLANT
PACK BASIN MINOR ARMC (MISCELLANEOUS) ×2 IMPLANT
SPONGE XRAY 4X4 16PLY STRL (MISCELLANEOUS) ×2 IMPLANT
SUT PROLENE 3 0 FS 2 (SUTURE) ×12 IMPLANT
SUT VIC AB 3-0 SH 27 (SUTURE) ×1
SUT VIC AB 3-0 SH 27X BRD (SUTURE) ×1 IMPLANT
SUT VIC AB 4-0 FS2 27 (SUTURE) ×2 IMPLANT
SYR BULB IRRIG 60ML STRL (SYRINGE) ×2 IMPLANT
SYRINGE 10CC LL (SYRINGE) ×2 IMPLANT

## 2016-02-16 NOTE — Anesthesia Preprocedure Evaluation (Signed)
Anesthesia Evaluation  Patient identified by MRN, date of birth, ID band Patient awake    Reviewed: Allergy & Precautions, H&P , NPO status , Patient's Chart, lab work & pertinent test results, reviewed documented beta blocker date and time   History of Anesthesia Complications (+) history of anesthetic complications (Difficulty voiding)  Airway Mallampati: III  TM Distance: >3 FB Neck ROM: full    Dental no notable dental hx. (+) Teeth Intact   Pulmonary neg shortness of breath, asthma , neg sleep apnea, neg COPD, neg recent URI,    Pulmonary exam normal breath sounds clear to auscultation       Cardiovascular Exercise Tolerance: Good negative cardio ROS Normal cardiovascular exam Rhythm:regular Rate:Normal     Neuro/Psych Seizures - (as a child),  negative psych ROS   GI/Hepatic Neg liver ROS, GERD  ,  Endo/Other  neg diabetesHypothyroidism (no longer has this, but h/o) Morbid obesity  Renal/GU negative Renal ROS  negative genitourinary   Musculoskeletal   Abdominal   Peds  Hematology negative hematology ROS (+)   Anesthesia Other Findings Past Medical History:   PCOS (polycystic ovarian syndrome)                           GERD (gastroesophageal reflux disease)                       Breast mass                                     x 1  month     Comment:about a 1 cm mass Left at 2 o'clock per pt   Hypothyroid                                                    Comment:h/o outgrew   Seizures (HCC)                                                 Comment:as a child due to sulfa   Complication of anesthesia                                     Comment:difficulty voiding postop   Asthma                                                         Comment:has not used inhaler in years   Reproductive/Obstetrics negative OB ROS                             Anesthesia Physical Anesthesia  Plan  ASA: III  Anesthesia Plan: General   Post-op Pain Management:    Induction:   Airway Management Planned:   Additional Equipment:   Intra-op Plan:   Post-operative Plan:  Informed Consent: I have reviewed the patients History and Physical, chart, labs and discussed the procedure including the risks, benefits and alternatives for the proposed anesthesia with the patient or authorized representative who has indicated his/her understanding and acceptance.   Dental Advisory Given  Plan Discussed with: Anesthesiologist, CRNA and Surgeon  Anesthesia Plan Comments:         Anesthesia Quick Evaluation

## 2016-02-16 NOTE — Anesthesia Procedure Notes (Signed)
Procedure Name: Intubation Performed by: Edyth Gunnels Pre-anesthesia Checklist: Patient identified, Patient being monitored, Timeout performed, Emergency Drugs available and Suction available Patient Re-evaluated:Patient Re-evaluated prior to inductionOxygen Delivery Method: Circle system utilized Preoxygenation: Pre-oxygenation with 100% oxygen Intubation Type: IV induction Ventilation: Mask ventilation without difficulty Laryngoscope Size: Mac and 3 Grade View: Grade III Tube type: Oral Tube size: 7.0 mm Number of attempts: 1 Airway Equipment and Method: Stylet Placement Confirmation: ETT inserted through vocal cords under direct vision,  positive ETCO2 and breath sounds checked- equal and bilateral Secured at: 21 cm Tube secured with: Tape Dental Injury: Teeth and Oropharynx as per pre-operative assessment

## 2016-02-16 NOTE — Transfer of Care (Signed)
Immediate Anesthesia Transfer of Care Note  Patient: Kimberly Mcfarland  Procedure(s) Performed: Procedure(s): EXCISION SCALP LESION,excision 2 large scalp cysrts with intermediate closure (N/A)  Patient Location: PACU  Anesthesia Type:General  Level of Consciousness: awake, alert  and oriented  Airway & Oxygen Therapy: Patient Spontanous Breathing  Post-op Assessment: Report given to RN and Post -op Vital signs reviewed and stable  Post vital signs: Reviewed and stable  Last Vitals:  Filed Vitals:   02/16/16 1254 02/16/16 1305  BP: 112/72 108/75  Pulse: 59 56  Temp: 36.7 C   Resp: 20 15    Complications: No apparent anesthesia complications

## 2016-02-16 NOTE — Discharge Instructions (Signed)

## 2016-02-16 NOTE — Op Note (Signed)
Preop diagnosis: Large scalp cysts  Post op diagnosis: Same  Operation: Excisiontwo large scalp cysts in the occipital region with the intermediate closure  Surgeon: S.G.Yunique Dearcos  Assistant:     Anesthesia: Gen.  Complications: None  EBL: 50-75  Drains: None  Description: This patient had 2 very large's epidermal cysts of the scalp located on the in the occipital region one more centrally which was the larger and one a little bit above and to the right side. Accordingly the patient was put to sleep with the endotracheal tube and then placed in the left lateral position held in place with a beanbag. Timeout was performed. The 2 large cystic area was then adequately exposed and some of the hair was trimmed overlying the cyst to allow for excision of some of the overlying skin. Area was prepped with Betadine and draped out and timeout performed. The larger cystic lesion mass which was about 8 cm in length was excised first. An elliptical skin incision was made and with the use of blunt and sharp dissection a large epidermal bilobed cyst was excised out completely. Leaving a fairly sizable defect at this site. Bleeding was controlled with cautery and after ensuring hemostasis somewhat the redundant skin was excised out. The deeper tissue were then approximated with interrupted 3-0 Vicryl. And the skin closed with the interrupted stitches of 3-0 Prolene. The slightly smaller cyst located little bit more superior end of the right was then excised out in a similar fashion. This cyst measured approximately 5-6 cm and the was circumferentially excised out along with some of the overlying skin. The bleeding was controlled with cautery. Deeper tissue closed with 3-0 Vicryl and the skin approximated with interrupted stitches of 3-0 Prolene. Both areas were covered with bacitracin ointment after cleansing this region. Patient subsequently was extubated and returned recovery room stable condition

## 2016-02-16 NOTE — H&P (Signed)
   Expand All Collapse All   Patient ID: Kimberly Mcfarland, female DOB: 03-02-1981, 35 y.o. MRN: [id number]  Chief Complaint  Patient presents with  . Other    cyst    HPI Kimberly Mcfarland is a 35 y.o. female. Here today for evaluation of large cyst on her scalp. She noticed this about 17 years ago. She has had cyst removed about 10 years ago. She states it has gotten larger over the past year. She states there is 2 in her scalp area. No drainage. She states the ED gave her some antibiotics 12-20-15 but she did not take them. Since last seen on 01/06/16, no interval changes in her history. I have reviewed the history of present illness with the patient. HPI  Past Medical History  Diagnosis Date  . Asthma   . PCOS (polycystic ovarian syndrome)   . Hypothyroid   . GERD (gastroesophageal reflux disease)   . Breast mass x 1 month    about a 1 cm mass Left at 2 o'clock per pt    Past Surgical History  Procedure Laterality Date  . Cyst excision  2010    History reviewed. No pertinent family history.  Social History Social History  Substance Use Topics  . Smoking status: Never Smoker   . Smokeless tobacco: Never Used  . Alcohol Use: 0.0 oz/week    0 Standard drinks or equivalent per week     Comment: wine occasionally    Allergies  Allergen Reactions  . Sulfa Antibiotics Other (See Comments)    seizures  . Levaquin [Levofloxacin In D5w] Hives  . Penicillins Hives    Current Outpatient Prescriptions  Medication Sig Dispense Refill  . albuterol (PROVENTIL HFA;VENTOLIN HFA) 108 (90 BASE) MCG/ACT inhaler Inhale 2 puffs into the lungs every 4 (four) hours as needed for wheezing or shortness of breath.    . ranitidine (ZANTAC) 150 MG tablet Take 150 mg by mouth 2 (two) times daily.     No current facility-administered medications for this visit.    Review of Systems Review of  Systems  Constitutional: Negative.  Respiratory: Negative.  Cardiovascular: Negative.    Blood pressure 132/82, pulse 80, resp. rate 14, height  (1.6 m), weight 247 lb (112.038 kg), last menstrual period 12/12/2015.  Physical Exam Physical Exam  Constitutional: She is oriented to person, place, and time. She appears well-developed and well-nourished.  HENT:  Mouth/Throat: Oropharynx is clear and moist.  Under occipital area 6-7 cm cyst and also over the right posterior parietal area a 5 cm cyst  Eyes: Conjunctivae are normal. No scleral icterus.  Neck: Neck supple.  Cardiovascular: Normal rate, regular rhythm and normal heart sounds.  Pulmonary/Chest: Effort normal and breath sounds normal.  Lymphadenopathy:   She has no cervical adenopathy.  Neurological: She is alert and oriented to person, place, and time.  Skin: Skin is warm and dry.  Psychiatric: Her behavior is normal.    Data Reviewed Progress notes.  Assessment    Scalp cyst-multiple. 2 very large cysts. Best dealt with by excision. This is also best done under anesthesia     Plan    Discussed risk and benefits of excision of the scalp cyst in detail, patient agrees.  Patient's surgery has been scheduled for 02-16-16 at [facility]     PCP: No Pcp  This information has been scribed by Dorathy Daft RNBC.   SANKAR,SEEPLAPUTHUR G 02/16/16,9.42AM

## 2016-02-16 NOTE — Interval H&P Note (Signed)
History and Physical Interval Note:  02/16/2016 9:43 AM  Kimberly Mcfarland  has presented today for surgery, with the diagnosis of SCALP CYST  The various methods of treatment have been discussed with the patient and family. After consideration of risks, benefits and other options for treatment, the patient has consented to  Procedure(s): EXCISION SCALP LESION (N/A) as a surgical intervention .  The patient's history has been reviewed, patient examined, no change in status, stable for surgery.  I have reviewed the patient's chart and labs.  Questions were answered to the patient's satisfaction.     SANKAR,SEEPLAPUTHUR G

## 2016-02-16 NOTE — Anesthesia Postprocedure Evaluation (Signed)
Anesthesia Post Note  Patient: Kimberly Mcfarland  Procedure(s) Performed: Procedure(s) (LRB): EXCISION SCALP LESION,excision 2 large scalp cysrts with intermediate closure (N/A)  Patient location during evaluation: PACU Anesthesia Type: General Level of consciousness: awake and alert Pain management: pain level controlled Vital Signs Assessment: post-procedure vital signs reviewed and stable Respiratory status: spontaneous breathing, nonlabored ventilation, respiratory function stable and patient connected to nasal cannula oxygen Cardiovascular status: blood pressure returned to baseline and stable Postop Assessment: no signs of nausea or vomiting Anesthetic complications: no    Last Vitals:  Filed Vitals:   02/16/16 1320 02/16/16 1325  BP: 106/68 104/67  Pulse:    Temp:    Resp: 16 16    Last Pain:  Filed Vitals:   02/16/16 1338  PainSc: 6                  Lenard Simmer

## 2016-02-17 LAB — SURGICAL PATHOLOGY

## 2016-02-23 ENCOUNTER — Encounter: Payer: Self-pay | Admitting: General Surgery

## 2016-02-23 ENCOUNTER — Ambulatory Visit (INDEPENDENT_AMBULATORY_CARE_PROVIDER_SITE_OTHER): Payer: 59 | Admitting: General Surgery

## 2016-02-23 VITALS — BP 132/86 | HR 70 | Resp 12 | Ht 63.0 in | Wt 243.0 lb

## 2016-02-23 DIAGNOSIS — L729 Follicular cyst of the skin and subcutaneous tissue, unspecified: Secondary | ICD-10-CM

## 2016-02-23 NOTE — Patient Instructions (Addendum)
The patient is aware to call back for any questions or concer

## 2016-02-23 NOTE — Progress Notes (Signed)
This is a 35 year old female here today for her post op cyst removal of the scalp. Patient states she is doing well.  I have reviewed the history of present illness with the patient.  Both incisions in occipital area are healing well. No signs of infection The sutures were removed. Pathology revealed pilar cysts. Patient to return as needed.  PCP:  No Pcp Per Patient This information has been scribed by Ples Specter CMA.

## 2016-03-06 IMAGING — US US BREAST LTD UNI LEFT INC AXILLA
1 series · 6 of 6 positions shown · non-contrast
Comparison: None

ACR Breast Density Category a: The breast tissue is almost entirely
fatty.

CLINICAL DATA: The patient notes possible palpable soft nodule
within the upper-outer quadrant of the left breast. There is no
family history of breast cancer.

EXAM:
DIGITAL DIAGNOSTIC BILATERAL MAMMOGRAM WITH 3D TOMOSYNTHESIS WITH
CAD
ULTRASOUND BILATERAL BREAST

[Series 1: us breast ltd uni left inc axilla · 0.06mm/px · 6 of 6 slices shown]
[im 1/6]
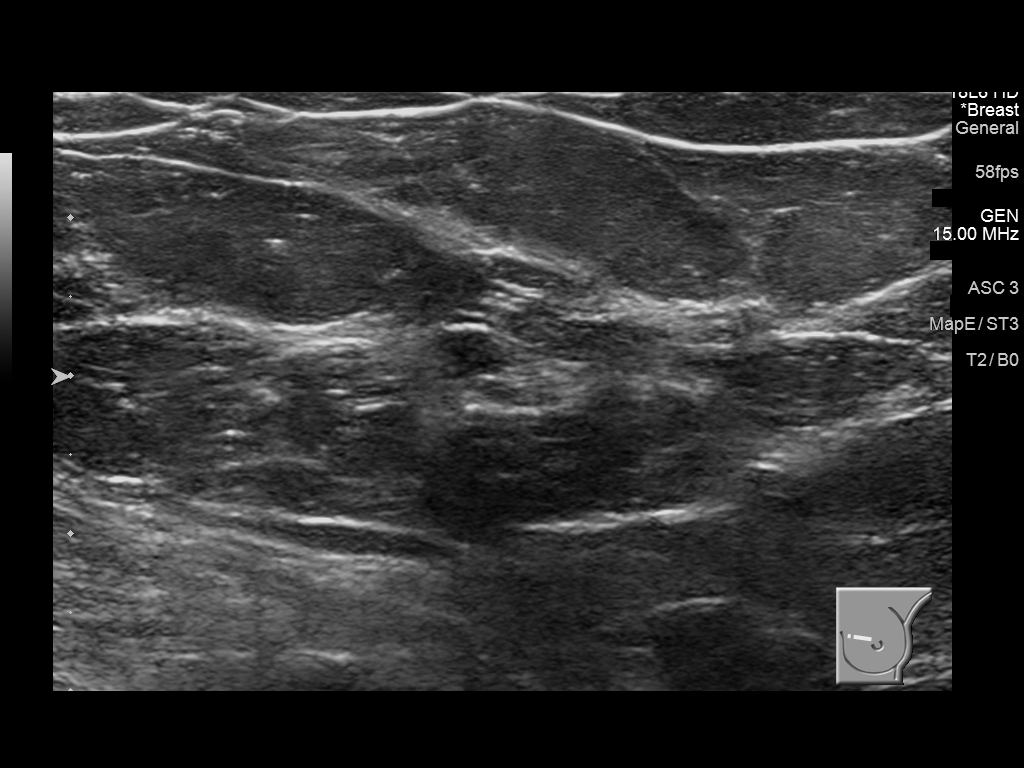
[im 2/6]
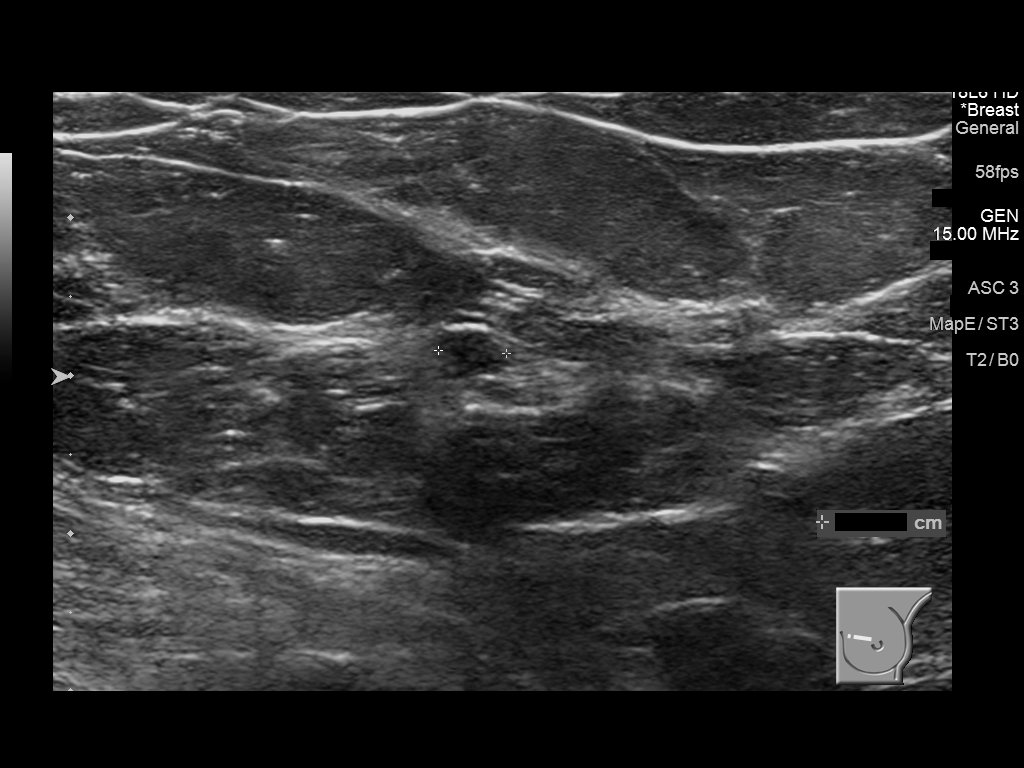
[im 3/6]
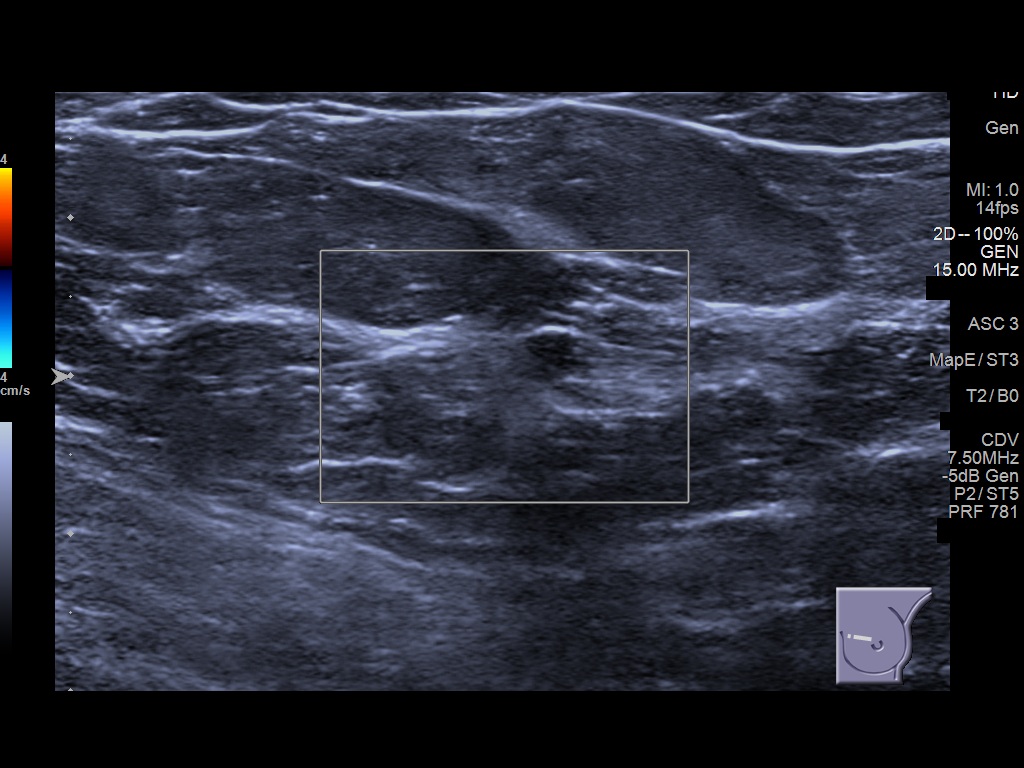
[im 4/6]
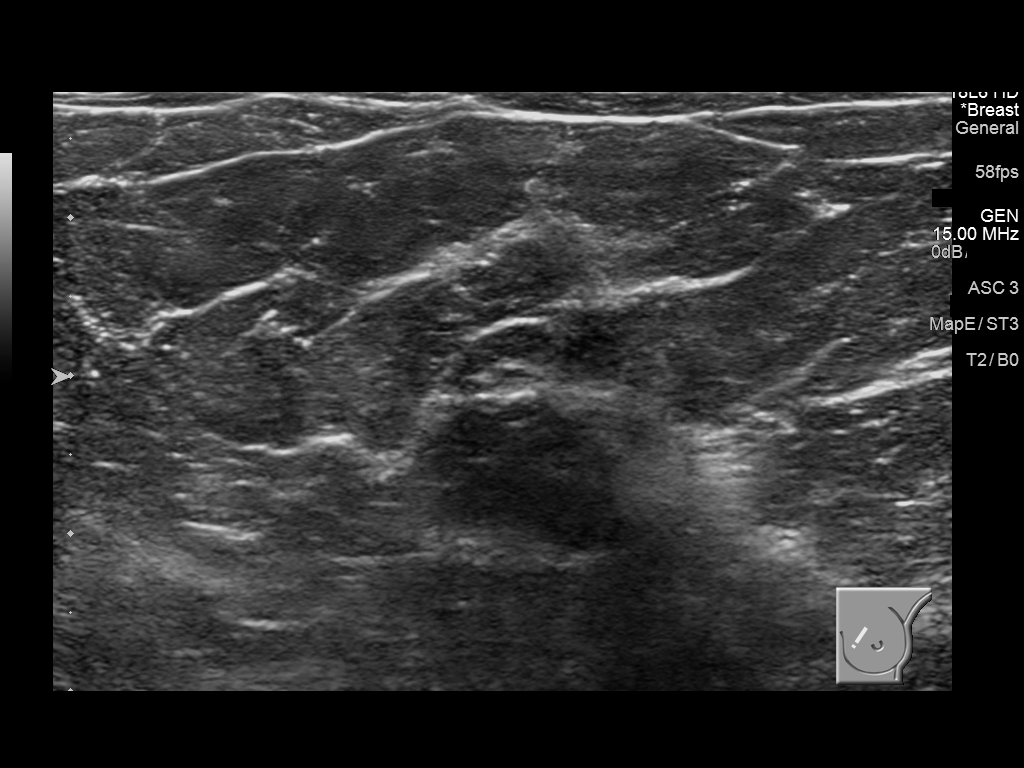
[im 5/6]
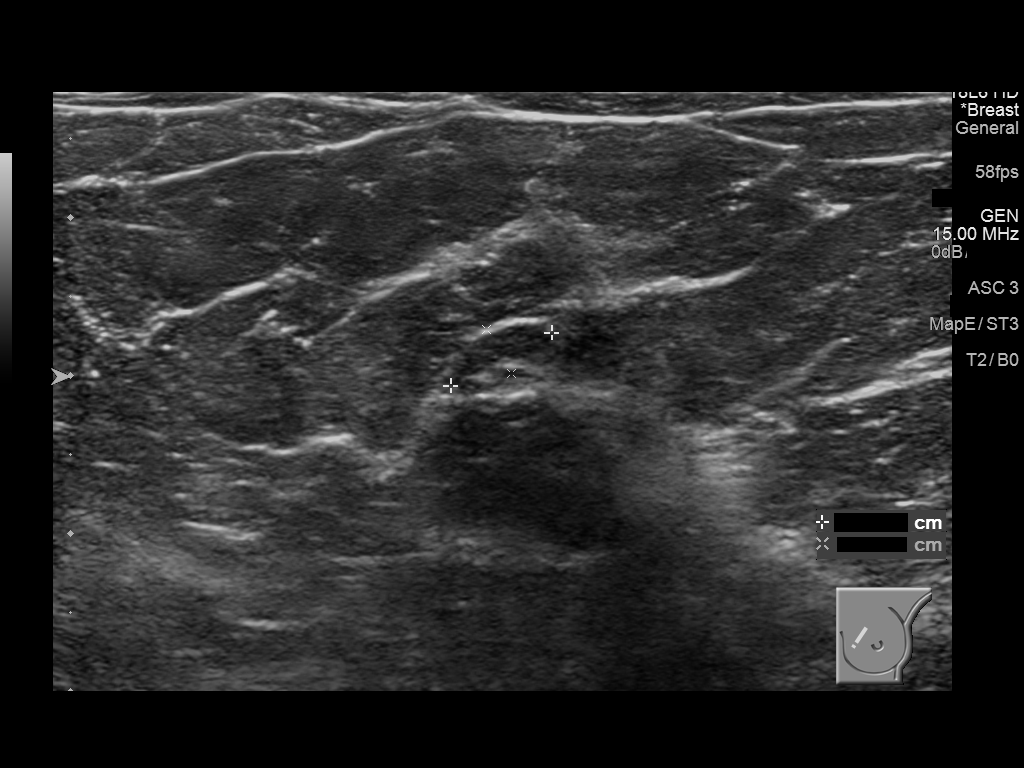
[im 6/6]
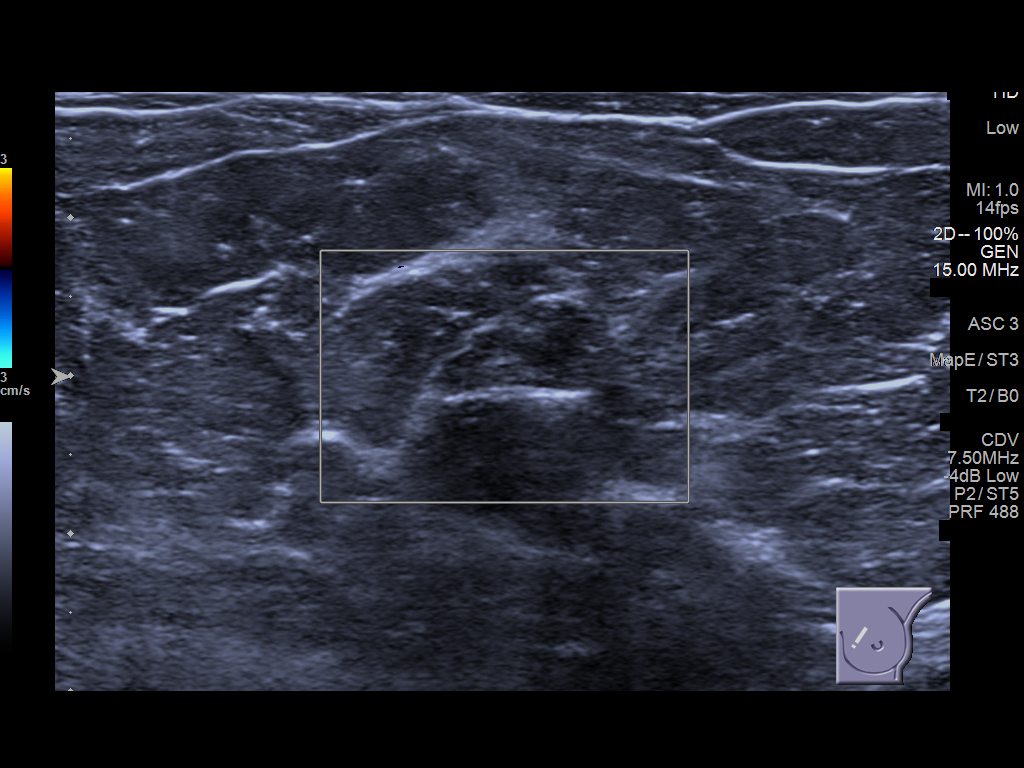

[6 of 6 positions shown; findings below may reference images not displayed]

FINDINGS: In the area of questionable palpable nodularity located within the
upper outer quadrant of the left breast there is predominately fatty
tissue. There is no mass, distortion, or worrisome shadowing.
Located within the upper inner quadrant of the left breast
approximately 12 cm from the nipple is an oval, circumscribed nodule
with a central cleft. This is most consistent with an intramammary
lymph node.

Also, within the central right breast slightly medially located and
within the posterior [DATE] of the breast is a tiny, oval,
circumscribed nodule. There are no additional findings.

Mammographic images were processed with CAD.

On physical exam, in the area of questioned palpable nodularity
noted by the patient within the upper-outer quadrant of the left
breast there is soft probable fatty lobule present. There are no
additional palpable findings within either breast.

Targeted ultrasound is performed, showing fibro fatty tissue within
the upper-outer quadrant of the left breast in the area of
questionable palpable nodularity.

Within the medial left breast at 10 o'clock position 12 cm from the
nipple is an oval, circumscribed hypoechoic nodule with central
hilar fat measuring 7 x 3 x 4 mm in size most consistent with a
small benign intramammary lymph node. There is no ultrasound
correlate for the small, oval circumscribed nodule located within
the right breast.
IMPRESSION: 1. Palpable area of soft nodularity noted by the patient corresponds
to a fatty lobule.
2. 7 mm probably benign intramammary lymph node located within the
left breast at 10 o'clock position 12 cm from nipple.
3. No ultrasound correlate for the small, oval circumscribed
probably benign nodule located centrally within the right breast.
Recommend bilateral diagnostic mammography and possibly ultrasound
as needed in 6 months.

RECOMMENDATION:
Bilateral diagnostic mammography and ultrasound at that time if felt
needed in 6 months.

I have discussed the findings and recommendations with the patient.
Results were also provided in writing at the conclusion of the
visit. If applicable, a reminder letter will be sent to the patient
regarding the next appointment.

BI-RADS CATEGORY  3: Probably benign.

## 2016-07-11 ENCOUNTER — Other Ambulatory Visit: Payer: Self-pay | Admitting: Obstetrics and Gynecology

## 2016-07-11 ENCOUNTER — Ambulatory Visit
Admission: RE | Admit: 2016-07-11 | Discharge: 2016-07-11 | Disposition: A | Discharge status: Home / Self Care | Payer: 59 | Source: Ambulatory Visit | Attending: Obstetrics and Gynecology | Admitting: Obstetrics and Gynecology

## 2016-07-11 DIAGNOSIS — N63 Unspecified lump in unspecified breast: Secondary | ICD-10-CM

## 2016-07-18 ENCOUNTER — Other Ambulatory Visit: Payer: Self-pay | Admitting: Obstetrics and Gynecology

## 2016-07-18 DIAGNOSIS — N63 Unspecified lump in unspecified breast: Secondary | ICD-10-CM

## 2016-10-25 DIAGNOSIS — R300 Dysuria: Secondary | ICD-10-CM | POA: Diagnosis not present

## 2017-01-08 DIAGNOSIS — J069 Acute upper respiratory infection, unspecified: Secondary | ICD-10-CM | POA: Diagnosis not present

## 2017-01-08 DIAGNOSIS — R197 Diarrhea, unspecified: Secondary | ICD-10-CM | POA: Diagnosis not present

## 2017-01-08 DIAGNOSIS — J4520 Mild intermittent asthma, uncomplicated: Secondary | ICD-10-CM | POA: Diagnosis not present

## 2017-01-12 ENCOUNTER — Ambulatory Visit: Payer: 59

## 2017-01-12 ENCOUNTER — Other Ambulatory Visit: Payer: 59

## 2017-01-26 ENCOUNTER — Ambulatory Visit
Admission: RE | Admit: 2017-01-26 | Discharge: 2017-01-26 | Disposition: A | Discharge status: Home / Self Care | Payer: 59 | Source: Ambulatory Visit | Attending: Obstetrics and Gynecology | Admitting: Obstetrics and Gynecology

## 2017-01-26 DIAGNOSIS — N63 Unspecified lump in unspecified breast: Secondary | ICD-10-CM

## 2017-01-26 DIAGNOSIS — N6322 Unspecified lump in the left breast, upper inner quadrant: Secondary | ICD-10-CM | POA: Diagnosis not present

## 2017-01-26 DIAGNOSIS — N6320 Unspecified lump in the left breast, unspecified quadrant: Secondary | ICD-10-CM | POA: Diagnosis present

## 2017-01-26 DIAGNOSIS — R928 Other abnormal and inconclusive findings on diagnostic imaging of breast: Secondary | ICD-10-CM | POA: Diagnosis not present

## 2017-02-02 ENCOUNTER — Other Ambulatory Visit: Payer: Self-pay | Admitting: Obstetrics and Gynecology

## 2017-02-07 ENCOUNTER — Other Ambulatory Visit: Payer: Self-pay | Admitting: Obstetrics and Gynecology

## 2017-02-07 DIAGNOSIS — N6320 Unspecified lump in the left breast, unspecified quadrant: Principal | ICD-10-CM

## 2017-02-07 DIAGNOSIS — N6310 Unspecified lump in the right breast, unspecified quadrant: Secondary | ICD-10-CM

## 2017-02-07 DIAGNOSIS — N6324 Unspecified lump in the left breast, lower inner quadrant: Secondary | ICD-10-CM

## 2017-05-14 DIAGNOSIS — J019 Acute sinusitis, unspecified: Secondary | ICD-10-CM | POA: Diagnosis not present

## 2017-05-14 DIAGNOSIS — B9689 Other specified bacterial agents as the cause of diseases classified elsewhere: Secondary | ICD-10-CM | POA: Diagnosis not present

## 2017-08-09 ENCOUNTER — Other Ambulatory Visit: Payer: 59

## 2017-08-10 ENCOUNTER — Encounter: Payer: 59 | Admitting: Obstetrics and Gynecology

## 2017-08-14 ENCOUNTER — Encounter: Payer: Self-pay | Admitting: Advanced Practice Midwife

## 2017-08-15 ENCOUNTER — Other Ambulatory Visit (INDEPENDENT_AMBULATORY_CARE_PROVIDER_SITE_OTHER): Payer: 59

## 2017-08-15 ENCOUNTER — Ambulatory Visit (INDEPENDENT_AMBULATORY_CARE_PROVIDER_SITE_OTHER): Payer: 59 | Admitting: Advanced Practice Midwife

## 2017-08-15 ENCOUNTER — Encounter: Payer: Self-pay | Admitting: Advanced Practice Midwife

## 2017-08-15 VITALS — BP 114/70 | Wt 245.0 lb

## 2017-08-15 DIAGNOSIS — Z131 Encounter for screening for diabetes mellitus: Secondary | ICD-10-CM

## 2017-08-15 DIAGNOSIS — O99210 Obesity complicating pregnancy, unspecified trimester: Secondary | ICD-10-CM

## 2017-08-15 DIAGNOSIS — Z3490 Encounter for supervision of normal pregnancy, unspecified, unspecified trimester: Secondary | ICD-10-CM

## 2017-08-15 DIAGNOSIS — Z113 Encounter for screening for infections with a predominantly sexual mode of transmission: Secondary | ICD-10-CM | POA: Diagnosis not present

## 2017-08-15 DIAGNOSIS — Z362 Encounter for other antenatal screening follow-up: Secondary | ICD-10-CM | POA: Diagnosis not present

## 2017-08-15 DIAGNOSIS — R87610 Atypical squamous cells of undetermined significance on cytologic smear of cervix (ASC-US): Secondary | ICD-10-CM | POA: Diagnosis not present

## 2017-08-15 DIAGNOSIS — Z8759 Personal history of other complications of pregnancy, childbirth and the puerperium: Secondary | ICD-10-CM

## 2017-08-15 DIAGNOSIS — Z124 Encounter for screening for malignant neoplasm of cervix: Secondary | ICD-10-CM | POA: Diagnosis not present

## 2017-08-15 DIAGNOSIS — O0990 Supervision of high risk pregnancy, unspecified, unspecified trimester: Secondary | ICD-10-CM

## 2017-08-15 NOTE — Progress Notes (Signed)
NOB/Hx of pre-eclampsia

## 2017-08-15 NOTE — Patient Instructions (Signed)

## 2017-08-15 NOTE — Progress Notes (Signed)
New Obstetric Patient H&P    Chief Complaint: "Desires prenatal care"   History of Present Illness: Patient is a 36 y.o. G2P1000 Not Hispanic or Latino female, LMP 05/09/2017 presents with amenorrhea and positive home pregnancy test. Based on u/s today her EDD is Estimated Date of Delivery: 03/09/18 and her EGA is [redacted]w[redacted]d. Cycles are 5. days, irregular, and occur approximately every : 1-2 months. Her last pap smear was 3 years ago and was no abnormalities.    She had a urine pregnancy test which was positive 2 week(s)  ago. Her last menstrual period was normal and lasted for  5 day(s). Since her LMP she claims she has experienced breast tenderness, fatigue, nausea. She denies vaginal bleeding. Her past medical history is contibutory. She is currently having every 6 month mammograms for enlarged lymph nodes in both breasts. Her last 6 month visit is due next month. We discussed the possibility of waiting until following the pregnancy for next mammogram. We will discuss with [facility] for POC. Her prior pregnancies are notable for pre-eclampsia. She rarely uses her inhaler for asthma.  Since her LMP, she admits to the use of tobacco products  no She claims she has gained   2 pounds since the start of her pregnancy.  There are cats in the home in the home  yes If yes Indoor She admits close contact with children on a regular basis  yes  She has had chicken pox in the past: She has received vaccine and non-immune follow up She has had Tuberculosis exposures, symptoms, or previously tested positive for TB   no Current or past history of domestic violence. no  Genetic Screening/Teratology Counseling: (Includes patient, baby's father, or anyone in either family with:)   1. Patient's age >/= 43 at [facility]  yes 2. Thalassemia ([place], [place], Mediterranean, or Asian background): MCV<80  no 3. Neural tube defect (meningomyelocele, spina bifida, anencephaly)  no 4. Congenital heart defect  no  5. Down  syndrome  no 6. Tay-Sachs (Jewish, [place]  no 7. Canavan's Disease  no 8. Sickle cell disease or trait (African)  no  9. Hemophilia or other blood disorders: grandfather has hemophilia, sister is a carrier 23. Muscular dystrophy  no  11. Cystic fibrosis  no  12. Huntington's Chorea  no  13. Mental retardation/autism  no 14. Other inherited genetic or chromosomal disorder  no 15. Maternal metabolic disorder (DM, PKU, etc)  no 16. Patient or FOB with a child with a birth defect not listed above no  16a. Patient or FOB with a birth defect themselves no 17. Recurrent pregnancy loss, or stillbirth  no  18. Any medications since LMP other than prenatal vitamins (include vitamins, supplements, OTC meds, drugs, alcohol)  Zantac 19. Any other genetic/environmental exposure to discuss  no  Infection History:   1. Lives with someone with TB or TB exposed  no  2. Patient or partner has history of genital herpes  no 3. Rash or viral illness since LMP  no 4. History of STI (GC, CT, HPV, syphilis, HIV)  no 5. History of recent travel :  no  Other pertinent information:  no     Review of Systems:10 point review of systems negative unless otherwise noted in HPI  Past Medical History:  Past Medical History:  Diagnosis Date  . Asthma    has not used inhaler in years  . Breast mass x 1  month   about a 1 cm mass Left  at 2 o'clock per pt  . Complication of anesthesia    difficulty voiding postop  . GERD (gastroesophageal reflux disease)   . 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 Hypothyroid    h/o outgrew  . PCOS (polycystic ovarian syndrome)   . Seizures (HCC)    as a child due to sulfa    Past Surgical History:  Past Surgical History:  Procedure Laterality Date  . CYST EXCISION  2010   head cysts x 14.  high levels of keratin  . LESION EXCISION N/A 02/16/2016   Procedure: EXCISION SCALP LESION,excision 2 large scalp cysrts with intermediate closure;  Surgeon: Kieth Brightly, MD;  Location: [facility] ORS;  Service: General;  Laterality: N/A;  . WISDOM TOOTH EXTRACTION      Gynecologic History: Patient's last menstrual period was 05/09/2017.  Obstetric History: G2P1000  Family History:  Family History  Problem Relation Age of Onset  . Breast cancer Other   . Diabetes Maternal Grandmother        type 2  . Factor VIII deficiency Other        Also has favtor 9 deficiency  . Factor VIII deficiency Other        also has factor 9 deficiency    Social History:  Social History   Social History  . Marital status: Married    Spouse name: N/A  . Number of children: N/A  . Years of education: N/A   Occupational History  . Not on file.   Social History Main Topics  . Smoking status: Never Smoker  . Smokeless tobacco: Never Used  . Alcohol use 0.0 oz/week     Comment: wine occasionally  . Drug use: No  . Sexual activity: Yes    Birth control/ protection: Condom   Other Topics Concern  . Not on file   Social History Narrative  . No narrative on file    Allergies:  Allergies  Allergen Reactions  . Sulfa Antibiotics Other (See Comments)    seizures  . Levaquin [Levofloxacin In D5w] Hives  . Penicillins Hives    Medications: Prior to Admission medications   Medication Sig Start Date End Date Taking? Authorizing Provider  albuterol (PROVENTIL HFA;VENTOLIN HFA) 108 (90 BASE) MCG/ACT inhaler Inhale 2 puffs into the lungs every 4 (four) hours as needed for wheezing or shortness of breath.   Yes [provider]  ranitidine (ZANTAC) 150 MG tablet Take 150 mg by mouth 2 (two) times daily.   Yes [provider]    Physical Exam Vitals: Blood pressure 114/70, weight 245 lb (111.1 kg), last menstrual period 05/09/2017.  General: NAD HEENT: normocephalic, anicteric Thyroid: no enlargement, no palpable nodules Pulmonary: No increased work of breathing, CTAB Cardiovascular: RRR, distal pulses 2+ Abdomen: NABS, soft, non-tender,  non-distended.  Umbilicus without lesions.  No hepatomegaly, splenomegaly or masses palpable. No evidence of hernia  Genitourinary:  External: Normal external female genitalia.  Normal urethral meatus, normal  Bartholin's and Skene's glands.    Vagina: Normal vaginal mucosa, no evidence of prolapse.    Cervix: Grossly normal in appearance, no bleeding, no CMT  Uterus: Enlarged, mobile, normal contour.    Adnexa: ovaries non-enlarged, no adnexal masses  Rectal: deferred Extremities: no edema, erythema, or tenderness Neurologic: Grossly intact Psychiatric: mood appropriate, affect full   Assessment: 36 y.o. G2P1000 at 109w4d presenting to initiate prenatal care  Plan: 1) Avoid alcoholic beverages. 2) Patient encouraged not to smoke.  3) Discontinue the use of all non-medicinal  drugs and chemicals.  4) Take prenatal vitamins daily.  5) Nutrition, food safety (fish, cheese advisories, and high nitrite foods) and exercise discussed. 6) Hospital and practice style discussed with cross coverage system.  7) Genetic Screening, such as with 1st Trimester Screening, cell free fetal DNA, AFP testing, and Ultrasound, as well as with amniocentesis and CVS as appropriate, is discussed with patient. At the conclusion of today's visit patient declined genetic testing 8) Patient is asked about travel to areas at risk for the [place] virus, and counseled to avoid travel and exposure to mosquitoes or sexual partners who may have themselves been exposed to the virus. Testing is discussed, and will be ordered as appropriate.   Tresea Mall, CNM

## 2017-08-17 LAB — URINE CULTURE

## 2017-08-17 NOTE — Progress Notes (Signed)
Follow up note regarding mammogram:  I spoke with Melissa at [facility] today and she reviewed treatment plan with the U/S tech.  The plan will be for the patient to continue with her scheduled appointment but to only have bilateral u/s- no mammogram.   The patient is aware of the plan for ultrasound only on 9/27  Tresea Mall, CNM

## 2017-08-18 LAB — IGP,CTNGTV,APT HPV,RFX16/18,45
Chlamydia, Nuc. Acid Amp: NEGATIVE
GONOCOCCUS, NUC. ACID AMP: NEGATIVE
HPV APTIMA: NEGATIVE
PAP SMEAR COMMENT: 0
Trich vag by NAA: NEGATIVE

## 2017-09-12 ENCOUNTER — Other Ambulatory Visit: Payer: 59

## 2017-09-12 ENCOUNTER — Ambulatory Visit (INDEPENDENT_AMBULATORY_CARE_PROVIDER_SITE_OTHER): Payer: 59 | Admitting: Obstetrics and Gynecology

## 2017-09-12 VITALS — BP 128/82 | Wt 248.0 lb

## 2017-09-12 DIAGNOSIS — Z131 Encounter for screening for diabetes mellitus: Secondary | ICD-10-CM | POA: Diagnosis not present

## 2017-09-12 DIAGNOSIS — Z113 Encounter for screening for infections with a predominantly sexual mode of transmission: Secondary | ICD-10-CM

## 2017-09-12 DIAGNOSIS — O0990 Supervision of high risk pregnancy, unspecified, unspecified trimester: Secondary | ICD-10-CM | POA: Diagnosis not present

## 2017-09-12 DIAGNOSIS — O09299 Supervision of pregnancy with other poor reproductive or obstetric history, unspecified trimester: Secondary | ICD-10-CM | POA: Insufficient documentation

## 2017-09-12 DIAGNOSIS — O99210 Obesity complicating pregnancy, unspecified trimester: Secondary | ICD-10-CM | POA: Diagnosis not present

## 2017-09-12 DIAGNOSIS — Z8759 Personal history of other complications of pregnancy, childbirth and the puerperium: Secondary | ICD-10-CM | POA: Diagnosis not present

## 2017-09-12 DIAGNOSIS — Z1379 Encounter for other screening for genetic and chromosomal anomalies: Secondary | ICD-10-CM

## 2017-09-12 DIAGNOSIS — Z3A14 14 weeks gestation of pregnancy: Secondary | ICD-10-CM

## 2017-09-12 DIAGNOSIS — O09529 Supervision of elderly multigravida, unspecified trimester: Secondary | ICD-10-CM | POA: Insufficient documentation

## 2017-09-12 LAB — OB RESULTS CONSOLE VARICELLA ZOSTER ANTIBODY, IGG: VARICELLA IGG: IMMUNE

## 2017-09-12 NOTE — Progress Notes (Signed)
ROB 3 hour GTT 

## 2017-09-12 NOTE — Progress Notes (Signed)
Informaseq, PNL and 3-hr today

## 2017-09-13 LAB — COMPREHENSIVE METABOLIC PANEL
ALBUMIN: 3.7 g/dL (ref 3.5–5.5)
ALK PHOS: 56 IU/L (ref 39–117)
ALT: 8 IU/L (ref 0–32)
AST: 11 IU/L (ref 0–40)
Albumin/Globulin Ratio: 1.5 (ref 1.2–2.2)
BUN/Creatinine Ratio: 14 (ref 9–23)
BUN: 7 mg/dL (ref 6–20)
CHLORIDE: 99 mmol/L (ref 96–106)
CO2: 23 mmol/L (ref 20–29)
CREATININE: 0.5 mg/dL — AB (ref 0.57–1.00)
Calcium: 8.6 mg/dL — ABNORMAL LOW (ref 8.7–10.2)
GFR calc non Af Amer: 126 mL/min/{1.73_m2} (ref >59)
GFR, EST AFRICAN AMERICAN: 145 mL/min/{1.73_m2} (ref >59)
GLOBULIN, TOTAL: 2.5 g/dL (ref 1.5–4.5)
Glucose: 176 mg/dL — ABNORMAL HIGH (ref 65–99)
Potassium: 3.6 mmol/L (ref 3.5–5.2)
SODIUM: 135 mmol/L (ref 134–144)
TOTAL PROTEIN: 6.2 g/dL (ref 6.0–8.5)

## 2017-09-13 LAB — RPR+RH+ABO+RUB AB+AB SCR+CB...
Antibody Screen: NEGATIVE
HEMOGLOBIN: 12.4 g/dL (ref 11.1–15.9)
HIV Screen 4th Generation wRfx: NONREACTIVE
Hematocrit: 37.1 % (ref 34.0–46.6)
Hepatitis B Surface Ag: NEGATIVE
MCH: 29 pg (ref 26.6–33.0)
MCHC: 33.4 g/dL (ref 31.5–35.7)
MCV: 87 fL (ref 79–97)
PLATELETS: 269 10*3/uL (ref 150–379)
RBC: 4.27 x10E6/uL (ref 3.77–5.28)
RDW: 14.7 % (ref 12.3–15.4)
RPR Ser Ql: NONREACTIVE
RUBELLA: 3.85 {index} (ref >0.99)
Rh Factor: POSITIVE
WBC: 10.9 10*3/uL — AB (ref 3.4–10.8)

## 2017-09-13 LAB — GESTATIONAL GLUCOSE TOLERANCE
GLUCOSE 2 HOUR GTT: 153 mg/dL (ref 65–154)
GLUCOSE 3 HOUR GTT: 118 mg/dL (ref 65–139)
GLUCOSE FASTING: 90 mg/dL (ref 65–94)
Glucose, GTT - 1 Hour: 161 mg/dL (ref 65–179)

## 2017-09-19 LAB — INFORMASEQ(SM) WITH XY ANALYSIS
FETAL FRACTION (%): 4.8
Fetal Number: 1
GESTATIONAL AGE AT COLLECTION: 14.6 wk
Weight: 248 [lb_av]

## 2017-09-20 ENCOUNTER — Ambulatory Visit
Admission: RE | Admit: 2017-09-20 | Discharge: 2017-09-20 | Disposition: A | Discharge status: Home / Self Care | Payer: 59 | Source: Ambulatory Visit | Attending: Obstetrics and Gynecology | Admitting: Obstetrics and Gynecology

## 2017-09-20 DIAGNOSIS — N6310 Unspecified lump in the right breast, unspecified quadrant: Secondary | ICD-10-CM

## 2017-09-20 DIAGNOSIS — N6320 Unspecified lump in the left breast, unspecified quadrant: Secondary | ICD-10-CM | POA: Insufficient documentation

## 2017-09-20 DIAGNOSIS — N6324 Unspecified lump in the left breast, lower inner quadrant: Secondary | ICD-10-CM

## 2017-09-20 DIAGNOSIS — N6489 Other specified disorders of breast: Secondary | ICD-10-CM | POA: Diagnosis not present

## 2017-09-25 ENCOUNTER — Telehealth: Payer: Self-pay | Admitting: Obstetrics and Gynecology

## 2017-09-25 NOTE — Telephone Encounter (Signed)
LM with results. Due for bilat dx mammo and u/s 6 months after delivery. Will put in orders after delivery. F/u sooner prn.

## 2017-10-10 ENCOUNTER — Encounter: Payer: Self-pay | Admitting: Obstetrics and Gynecology

## 2017-10-10 ENCOUNTER — Ambulatory Visit (INDEPENDENT_AMBULATORY_CARE_PROVIDER_SITE_OTHER): Payer: 59 | Admitting: Obstetrics and Gynecology

## 2017-10-10 VITALS — BP 126/84 | Wt 248.0 lb

## 2017-10-10 DIAGNOSIS — Z3A18 18 weeks gestation of pregnancy: Secondary | ICD-10-CM

## 2017-10-10 DIAGNOSIS — O09299 Supervision of pregnancy with other poor reproductive or obstetric history, unspecified trimester: Secondary | ICD-10-CM

## 2017-10-10 DIAGNOSIS — O09529 Supervision of elderly multigravida, unspecified trimester: Secondary | ICD-10-CM

## 2017-10-10 DIAGNOSIS — O09522 Supervision of elderly multigravida, second trimester: Secondary | ICD-10-CM

## 2017-10-10 NOTE — Patient Instructions (Signed)

## 2017-10-10 NOTE — Progress Notes (Signed)
Routine Prenatal Care Visit  Subjective  Kimberly Mcfarland is a 36 y.o. G2P1000 at 4380w4d being seen today for ongoing prenatal care.  She is currently monitored for the following issues for this high-risk pregnancy and has Advanced maternal age in multigravida; Supervision of high-risk pregnancy of elderly multigravida (>= 669 years old at time of delivery); and Hx of preeclampsia, prior pregnancy, currently pregnant on her problem list.  ----------------------------------------------------------------------------------- Patient reports no complaints.    . Vag. Bleeding: None.  Movement: Present. Denies leaking of fluid.  ----------------------------------------------------------------------------------- The following portions of the patient's history were reviewed and updated as appropriate: allergies, current medications, past family history, past medical history, past social history, past surgical history and problem list. Problem list updated.   Objective  Blood pressure 126/84, weight 248 lb (112.5 kg), last menstrual period 05/09/2017. Pregravid weight 245 lb (111.1 kg) Total Weight Gain 3 lb (1.361 kg) Urinalysis: Urine Protein: Negative Urine Glucose: Negative  Fetal Status: Fetal Heart Rate (bpm): 140   Movement: Present     General:  Alert, oriented and cooperative. Patient is in no acute distress.  Skin: Skin is warm and dry. No rash noted.   Cardiovascular: Normal heart rate noted  Respiratory: Normal respiratory effort, no problems with respiration noted  Abdomen: Soft, gravid, appropriate for gestational age. Pain/Pressure: Absent     Pelvic:  Cervical exam deferred        Extremities: Normal range of motion.  Edema: None  Mental Status: Normal mood and affect. Normal behavior. Normal judgment and thought content.   Assessment   36 y.o. G2P1000 at 1580w4d by  03/09/2018, by Ultrasound presenting for routine prenatal visit  Plan   Pregnancy #2 Problems (from 05/09/17 to  present)    Problem Noted Resolved   Advanced maternal age in multigravida 09/12/2017 by Vena AustriaStaebler, Andreas, MD No   Overview Addendum 09/25/2017  1:04 PM by Vena AustriaStaebler, Andreas, MD    Accepts InformaSeq drawn 09/12/17 normal XY      Supervision of high-risk pregnancy of elderly multigravida (>= 819 years old at time of delivery) 09/12/2017 by Vena AustriaStaebler, Andreas, MD No   Overview Addendum 09/25/2017  1:06 PM by Vena AustriaStaebler, Andreas, MD    [facility]inic [facility] Prenatal Labs  Dating 10 week US Blood type: A pos  Genetic Screen Informaseq normal XY Antibody:negative  Anatomic US  Rubella: Immune   Varicella: Immune  GTT Early:  Opted for 3-hr with 4/4 values normal             Third trimester:  RPR: NR  Rhogam NA HBsAg: negative  TDaP vaccine                       Flu Shot: HIV: negative  Baby Food                                GBS:  Contraception  Pap:  CBB     CS/VBAC    Support Person             Hx of preeclampsia, prior pregnancy, currently pregnant 09/12/2017 by Vena AustriaStaebler, Andreas, MD No   Overview Signed 09/12/2017  8:36 AM by Vena AustriaStaebler, Andreas, MD    Start low dose ASA at >[redacted] weeks gestation as per USPTF recommendation "Low-Dose Aspirin Use for the Prevention of Morbidity and Mortality From Preeclampsia: Preventive Medicine"  furthermore endorsed by ACOG, WHO, and NIH based on evidence level  B for the prevention of preeclampsia  In women deemed high risk  (diabetes, renal disease, chronic hypertension, history of preeclampsia in prior gestation, autoimmune diseases, or multifetal gestations).  ACOG Committee Opinion 743 "Low-Dose Asprin Use During Pregnancy" June 25th 2018  High Risk (Start if 1 or more present) History of preeclampsia Multifetal Gestation Chronic HTN Type I or II DM Renal Disease Autoimmune Disease (SLE, Antiphospholipid antibody syndrome)  Moderate Risk (consider starting if more than one present) Nulliparity Obesity (BMI >30) Family history of Preeclampsia (Mother or  sister) Socioeconomic characteristics (African American, low socieeconomic status) Age 78 years or older Personal history factors (low birthweight of SGA, previous adverse pregnancy outcome, more than 10 year pregnancy interval)        Please refer to After Visit Summary for other counseling recommendations.   Return in about 2 weeks (around 10/24/2017) for schedule anatomy u/s and routine prenatal.  Thomasene Mohair, MD  10/10/2017 8:36 AM

## 2017-10-24 ENCOUNTER — Ambulatory Visit (INDEPENDENT_AMBULATORY_CARE_PROVIDER_SITE_OTHER): Payer: 59

## 2017-10-24 ENCOUNTER — Ambulatory Visit (INDEPENDENT_AMBULATORY_CARE_PROVIDER_SITE_OTHER): Payer: 59 | Admitting: Advanced Practice Midwife

## 2017-10-24 VITALS — BP 128/84 | Wt 252.0 lb

## 2017-10-24 DIAGNOSIS — O09529 Supervision of elderly multigravida, unspecified trimester: Secondary | ICD-10-CM | POA: Diagnosis not present

## 2017-10-24 DIAGNOSIS — Z3A20 20 weeks gestation of pregnancy: Secondary | ICD-10-CM

## 2017-10-24 DIAGNOSIS — Z0489 Encounter for examination and observation for other specified reasons: Secondary | ICD-10-CM

## 2017-10-24 DIAGNOSIS — IMO0002 Reserved for concepts with insufficient information to code with codable children: Secondary | ICD-10-CM

## 2017-10-24 NOTE — Progress Notes (Signed)
Routine Prenatal Care Visit  Subjective  Kimberly MazeMary C Mcfarland is a 36 y.o. G2P1000 at 6465w4d being seen today for ongoing prenatal care.  She is currently monitored for the following issues for this high-risk pregnancy and has Advanced maternal age in multigravida; Supervision of high-risk pregnancy of elderly multigravida (>= 36 years old at time of delivery); and Hx of preeclampsia, prior pregnancy, currently pregnant on her problem list.  ----------------------------------------------------------------------------------- Patient reports no complaints.    . Vag. Bleeding: None.  Movement: Present. Denies leaking of fluid.  ----------------------------------------------------------------------------------- The following portions of the patient's history were reviewed and updated as appropriate: allergies, current medications, past family history, past medical history, past social history, past surgical history and problem list. Problem list updated.   Objective  Blood pressure 128/84, weight 252 lb (114.3 kg), last menstrual period 05/09/2017. Pregravid weight 245 lb (111.1 kg) Total Weight Gain 7 lb (3.175 kg) Urinalysis: Urine Protein: Negative Urine Glucose: Negative  Fetal Status:     Movement: Present     Anatomy scan today incomplete for cardiac and face views. AC measures at 88%. f/u scan nv  General:  Alert, oriented and cooperative. Patient is in no acute distress.  Skin: Skin is warm and dry. No rash noted.   Cardiovascular: Normal heart rate noted  Respiratory: Normal respiratory effort, no problems with respiration noted  Abdomen: Soft, gravid, appropriate for gestational age. Pain/Pressure: Absent     Pelvic:  Cervical exam deferred        Extremities: Normal range of motion.     Mental Status: Normal mood and affect. Normal behavior. Normal judgment and thought content.   Assessment   36 y.o. G2P1000 at 6865w4d by  03/09/2018, by Ultrasound presenting for routine prenatal  visit  Plan   Pregnancy #2 Problems (from 05/09/17 to present)    Problem Noted Resolved   Advanced maternal age in multigravida 09/12/2017 by Vena AustriaStaebler, Andreas, MD No   Overview Addendum 09/25/2017  1:04 PM by Vena AustriaStaebler, Andreas, MD    Accepts InformaSeq drawn 09/12/17 normal XY      Supervision of high-risk pregnancy of elderly multigravida (>= 36 years old at time of delivery) 09/12/2017 by Vena AustriaStaebler, Andreas, MD No   Overview Addendum 09/25/2017  1:06 PM by Vena AustriaStaebler, Andreas, MD    Clinic [facility] Prenatal Labs  Dating 10 week US Blood type: A pos  Genetic Screen Informaseq normal XY Antibody:negative  Anatomic US  Rubella: Immune   Varicella: Immune  GTT Early:  Opted for 3-hr with 4/4 values normal             Third trimester:  RPR: NR  Rhogam NA HBsAg: negative  TDaP vaccine                       Flu Shot: HIV: negative  Baby Food                                GBS:  Contraception  Pap:  CBB     CS/VBAC    Support Person               Hx of preeclampsia, prior pregnancy, currently pregnant 09/12/2017 by Vena AustriaStaebler, Andreas, MD No   Overview Signed 09/12/2017  8:36 AM by Vena AustriaStaebler, Andreas, MD    Start low dose ASA at >[redacted] weeks gestation as per USPTF recommendation "Low-Dose Aspirin Use for the Prevention of Morbidity and Mortality  From Preeclampsia: Preventive Medicine"  furthermore endorsed by ACOG, WHO, and NIH based on evidence level B for the prevention of preeclampsia  In women deemed high risk  (diabetes, renal disease, chronic hypertension, history of preeclampsia in prior gestation, autoimmune diseases, or multifetal gestations).  ACOG Committee Opinion 743 "Low-Dose Asprin Use During Pregnancy" June 25th 2018  High Risk (Start if 1 or more present) History of preeclampsia Multifetal Gestation Chronic HTN Type I or II DM Renal Disease Autoimmune Disease (SLE, Antiphospholipid antibody syndrome)  Moderate Risk (consider starting if more than one  present) Nulliparity Obesity (BMI >30) Family history of Preeclampsia (Mother or sister) Socioeconomic characteristics (African American, low socieeconomic status) Age 74 years or older Personal history factors (low birthweight of SGA, previous adverse pregnancy outcome, more than 10 year pregnancy interval)           Preterm labor symptoms and general obstetric precautions including but not limited to vaginal bleeding, contractions, leaking of fluid and fetal movement were reviewed in detail with the patient. Please refer to After Visit Summary for other counseling recommendations.  Reminded patient to eat healthy foods and decrease sugar/simple carbohydrates  Return in about 4 weeks (around 11/21/2017) for f/u anatomy and rob.  Tresea Mall, CNM  10/24/2017 9:27 AM

## 2017-10-24 NOTE — Patient Instructions (Signed)

## 2017-10-24 NOTE — Progress Notes (Signed)
Anatomy scan today

## 2017-10-31 ENCOUNTER — Encounter: Payer: Self-pay | Admitting: General Surgery

## 2017-10-31 ENCOUNTER — Ambulatory Visit (INDEPENDENT_AMBULATORY_CARE_PROVIDER_SITE_OTHER): Payer: 59 | Admitting: General Surgery

## 2017-10-31 VITALS — BP 132/78 | HR 80 | Resp 12 | Ht 66.0 in | Wt 247.0 lb

## 2017-10-31 DIAGNOSIS — L729 Follicular cyst of the skin and subcutaneous tissue, unspecified: Secondary | ICD-10-CM | POA: Diagnosis not present

## 2017-10-31 NOTE — Progress Notes (Signed)
Patient ID: Kimberly Mcfarland, female   DOB: March 09, 1981, 36 y.o.   MRN: [id number]  Chief Complaint  Patient presents with  . Other    HPI Kimberly Mcfarland is a 36 y.o. female here today for a evaluation of a cyst on her head. Patient states she noticed this area about 6 years ago and it has been gradually increasing in size. On Sunday the patient noticed mild drainage and became tender. She thinks it is infected. She has had multiple cysts, and infected cysts before. She is [redacted] weeks pregnant.                                                                Marland Kitchen.HPI  Past Medical History:  Diagnosis Date  . Asthma    has not used inhaler in years  . Breast mass x 1  month   about a 1 cm mass Left at 2 o'clock per pt  . Complication of anesthesia    difficulty voiding postop  . GERD (gastroesophageal reflux disease)   . 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 Hypothyroid    h/o outgrew  . PCOS (polycystic ovarian syndrome)   . Seizures (HCC)    as a child due to sulfa    Past Surgical History:  Procedure Laterality Date  . CYST EXCISION  2010   head cysts x 14.  high levels of keratin  . WISDOM TOOTH EXTRACTION      Family History  Problem Relation Age of Onset  . Breast cancer Other   . Diabetes Maternal Grandmother        type 2  . Factor VIII deficiency Other        Also has favtor 9 deficiency  . Factor VIII deficiency Other        also has factor 9 deficiency    Social History Social History   Tobacco Use  . Smoking status: Never Smoker  . Smokeless tobacco: Never Used  Substance Use Topics  . Alcohol use: Yes    Alcohol/week: 0.0 oz    Comment: wine occasionally  . Drug use: No    Allergies  Allergen Reactions  . Sulfa Antibiotics Other (See Comments)    seizures  . Levaquin [Levofloxacin In D5w] Hives  . Penicillins Hives    Current Outpatient Medications  Medication Sig Dispense Refill  . albuterol (PROVENTIL HFA;VENTOLIN HFA) 108 (90 BASE) MCG/ACT inhaler  Inhale 2 puffs into the lungs every 4 (four) hours as needed for wheezing or shortness of breath.    . ranitidine (ZANTAC) 150 MG tablet Take 150 mg by mouth 2 (two) times daily.     No current facility-administered medications for this visit.     Review of Systems Review of Systems  Constitutional: Negative.   Respiratory: Negative.   Cardiovascular: Negative.     Blood pressure 132/78, pulse 80, resp. rate 12, height 5\' 6"  (1.676 m), weight 247 lb (112 kg), last menstrual period 05/09/2017.  Physical Exam Physical Exam  Constitutional: She is oriented to person, place, and time. She appears well-developed and well-nourished.  HENT:  Head:    Multiple epidermal cysts noted on patients scalp.   Neurological: She is alert and oriented to person, place, and time.  Skin: Skin is warm and  dry.  Psychiatric: She has a normal mood and affect. Her behavior is normal.    Data Reviewed Prior notes reviewed   Assessment    Infected 3cm epidermal cyst on right anterior scalp. Pt has history of scalp cysts with infection. Currently pregnant so in office I&D was recommended. Pt consented to in office I&D today.   Procedure: I&D infected epidermal cyst of scalp The area was cleaned and prepped in sterile fashion. 1cc of 1% xylocaine was injected into the epidermal cyst. A small incision was made in the center of the cyst and purulent material was removed. The area was dressed with 4x4 gauze. Pt was given instructions on wound care. Pt tolerated procedure well.     Plan     Patient to return as needed. The patient is aware to call back for any questions or concerns. Can call to schedule excision with Dr. Lemar LivingsByrnett after delivery of her baby.    HPI, Physical Exam, Assessment and Plan have been scribed under the direction and in the presence of Kathreen CosierS. G. Sankar, MD  Ples SpecterJessica Qualls, CMA   I have completed the exam and reviewed the above documentation for accuracy and completeness.  I agree  with the above.  [employer] has been used and any errors in dictation or transcription are unintentional.  Seeplaputhur G. Evette CristalSankar, M.D., F.A.C.S.  Gerlene BurdockSANKAR,SEEPLAPUTHUR G 10/31/2017, 1:18 PM

## 2017-10-31 NOTE — Patient Instructions (Signed)
Return as needed

## 2017-11-19 ENCOUNTER — Emergency Department
Admission: EM | Admit: 2017-11-19 | Discharge: 2017-11-20 | Disposition: A | Discharge status: Home / Self Care | Payer: 59 | Attending: Emergency Medicine | Admitting: Emergency Medicine

## 2017-11-19 ENCOUNTER — Other Ambulatory Visit: Payer: Self-pay

## 2017-11-19 DIAGNOSIS — R002 Palpitations: Secondary | ICD-10-CM | POA: Diagnosis not present

## 2017-11-19 DIAGNOSIS — E039 Hypothyroidism, unspecified: Secondary | ICD-10-CM | POA: Insufficient documentation

## 2017-11-19 DIAGNOSIS — J45909 Unspecified asthma, uncomplicated: Secondary | ICD-10-CM | POA: Diagnosis not present

## 2017-11-19 DIAGNOSIS — Z79899 Other long term (current) drug therapy: Secondary | ICD-10-CM | POA: Insufficient documentation

## 2017-11-19 LAB — COMPREHENSIVE METABOLIC PANEL
ALBUMIN: 2.9 g/dL — AB (ref 3.5–5.0)
ALT: 16 U/L (ref 14–54)
AST: 18 U/L (ref 15–41)
Alkaline Phosphatase: 76 U/L (ref 38–126)
Anion gap: 7 (ref 5–15)
BUN: 6 mg/dL (ref 6–20)
CO2: 25 mmol/L (ref 22–32)
CREATININE: 0.44 mg/dL (ref 0.44–1.00)
Calcium: 8.9 mg/dL (ref 8.9–10.3)
Chloride: 105 mmol/L (ref 101–111)
GFR calc Af Amer: >60 mL/min (ref >60)
GFR calc non Af Amer: >60 mL/min (ref >60)
GLUCOSE: 125 mg/dL — AB (ref 65–99)
Potassium: 3.9 mmol/L (ref 3.5–5.1)
SODIUM: 137 mmol/L (ref 135–145)
Total Bilirubin: 0.5 mg/dL (ref 0.3–1.2)
Total Protein: 6.8 g/dL (ref 6.5–8.1)

## 2017-11-19 LAB — CBC
HEMATOCRIT: 34.5 % — AB (ref 35.0–47.0)
Hemoglobin: 11.5 g/dL — ABNORMAL LOW (ref 12.0–16.0)
MCH: 29.4 pg (ref 26.0–34.0)
MCHC: 33.3 g/dL (ref 32.0–36.0)
MCV: 88.3 fL (ref 80.0–100.0)
Platelets: 274 10*3/uL (ref 150–440)
RBC: 3.91 MIL/uL (ref 3.80–5.20)
RDW: 13.8 % (ref 11.5–14.5)
WBC: 13.7 10*3/uL — ABNORMAL HIGH (ref 3.6–11.0)

## 2017-11-19 LAB — TROPONIN I: Troponin I: <0.03 ng/mL (ref <0.03)

## 2017-11-19 NOTE — ED Triage Notes (Signed)
Pt in with co "irregular heartbeat" since last night with some light headedness. Pt does not have hx or the same, pt is [redacted] weeks pregnant. Denies any pregnancy complaints at this time.

## 2017-11-19 NOTE — ED Notes (Signed)
Fetal heart tones 148bpm heard 1" below umbilicus.

## 2017-11-20 DIAGNOSIS — Z79899 Other long term (current) drug therapy: Secondary | ICD-10-CM | POA: Diagnosis not present

## 2017-11-20 DIAGNOSIS — E039 Hypothyroidism, unspecified: Secondary | ICD-10-CM | POA: Diagnosis not present

## 2017-11-20 DIAGNOSIS — J45909 Unspecified asthma, uncomplicated: Secondary | ICD-10-CM | POA: Diagnosis not present

## 2017-11-20 DIAGNOSIS — R002 Palpitations: Secondary | ICD-10-CM | POA: Diagnosis not present

## 2017-11-20 LAB — TSH: TSH: 2.196 u[IU]/mL (ref 0.350–4.500)

## 2017-11-20 LAB — TROPONIN I

## 2017-11-20 LAB — MAGNESIUM: Magnesium: 1.8 mg/dL (ref 1.7–2.4)

## 2017-11-20 NOTE — Discharge Instructions (Signed)
Please follow up with your OB/GYN for further evaluation of your symptoms.

## 2017-11-20 NOTE — ED Provider Notes (Signed)
[facility] Emergency Department Provider Note   ____________________________________________   First MD Initiated Contact with Patient 11/19/17 2355     (approximate)  I have reviewed the triage vital signs and the nursing notes.   HISTORY  Chief Complaint Palpitations    HPI Kimberly Mcfarland is a 36 y.o. female who comes into the hospital today with some palpitations.  The patient states that she felt like her heart was beating irregularly and skipping beats.  She notes that at work last night and then noticed it again tonight.  She states that it lasted about 20 minutes.  During that episode she got faint.  She thought maybe her blood sugar was low so she ate a peanut butter and jelly sandwich.  The patient reports that she was concerned so she decided to come in to get checked out.  She has never had this happen before.  She denies any chest pain, nausea, vomiting, abdominal pain.  The patient had a little bit of abdominal tightening and she is [redacted] weeks pregnant.  She said it felt like a Braxton Hicks contraction.  The patient is a G2 P1.  She has had no other episodes since this occurred around 8 PM.  The patient states that she had some diet Pepsi earlier today.  She also reports that she has been working and thinks she may be dehydrated.  She is here today for evaluation.  Past Medical History:  Diagnosis Date  . Asthma    has not used inhaler in years  . Breast mass x 1  month   about a 1 cm mass Left at 2 o'clock per pt  . Complication of anesthesia    difficulty voiding postop  . GERD (gastroesophageal reflux disease)   . 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 Hypothyroid    h/o outgrew  . PCOS (polycystic ovarian syndrome)   . 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 Advanced maternal age in multigravida 09/12/2017  . Supervision of high-risk pregnancy of elderly multigravida (>= 36 years  old at time of delivery) 09/12/2017  . Hx of preeclampsia, prior pregnancy, currently pregnant 09/12/2017    Past Surgical History:  Procedure Laterality Date  . CYST EXCISION  2010   head cysts x 14.  high levels of keratin  . LESION EXCISION N/A 02/16/2016   Procedure: EXCISION SCALP LESION,excision 2 large scalp cysrts with intermediate closure;  Surgeon: Kieth BrightlySeeplaputhur G Sankar, MD;  Location: [facility] ORS;  Service: General;  Laterality: N/A;  . WISDOM TOOTH EXTRACTION      Prior to Admission medications   Medication Sig Start Date End Date Taking? Authorizing Provider  albuterol (PROVENTIL HFA;VENTOLIN HFA) 108 (90 BASE) MCG/ACT inhaler Inhale 2 puffs into the lungs every 4 (four) hours as needed for wheezing or shortness of breath.    [provider]  ranitidine (ZANTAC) 150 MG tablet Take 150 mg by mouth 2 (two) times daily.    [provider]    Allergies Sulfa antibiotics; Levaquin [levofloxacin in d5w]; and Penicillins  Family History  Problem Relation Age of Onset  . Breast cancer Other   . Diabetes Maternal Grandmother        type 2  . Factor VIII deficiency Other        Also has favtor 9 deficiency  . Factor VIII deficiency Other  also has factor 9 deficiency    Social History Social History   Tobacco Use  . Smoking status: Never Smoker  . Smokeless tobacco: Never Used  Substance Use Topics  . Alcohol use: Yes    Alcohol/week: 0.0 oz    Comment: wine occasionally  . Drug use: No    Review of Systems  Constitutional: No fever/chills Eyes: No visual changes. ENT: No sore throat. Cardiovascular: Palpitations Respiratory: Denies shortness of breath. Gastrointestinal: No abdominal pain.  No nausea, no vomiting.  No diarrhea.  No constipation. Genitourinary: Negative for dysuria. Musculoskeletal: Negative for back pain. Skin: Negative for rash. Neurological: Lightheadedness and  dizziness   ____________________________________________   PHYSICAL EXAM:  VITAL SIGNS: ED Triage Vitals  Enc Vitals Group     BP 11/19/17 2119 133/77     Pulse Rate 11/19/17 2119 74     Resp 11/19/17 2119 15     Temp 11/19/17 2119 98.9 F (37.2 C)     Temp Source 11/19/17 2119 Oral     SpO2 11/19/17 2119 100 %     Weight 11/19/17 2121 247 lb (112 kg)     Height 11/19/17 2121 5\' 3"  (1.6 m)     Head Circumference --      Peak Flow --      Pain Score 11/19/17 2127 0     Pain Loc --      Pain Edu? --      Excl. in GC? --     Constitutional: Alert and oriented. Well appearing and in no acute distress. Eyes: Conjunctivae are normal. PERRL. EOMI. Head: Atraumatic. Nose: No congestion/rhinnorhea. Mouth/Throat: Mucous membranes are moist.  Oropharynx non-erythematous. Cardiovascular: Normal rate, regular rhythm. Grossly normal heart sounds.  Good peripheral circulation. Respiratory: Normal respiratory effort.  No retractions. Lungs CTAB. Gastrointestinal: Soft and nontender. No distention.  Positive bowel sounds Musculoskeletal: No lower extremity tenderness nor edema.  Neurologic:  Normal speech and language.  Skin:  Skin is warm, dry and intact.  Psychiatric: Mood and affect are normal.   ____________________________________________   LABS (all labs ordered are listed, but only abnormal results are displayed)  Labs Reviewed  CBC - Abnormal; Notable for the following components:      Result Value   WBC 13.7 (*)    Hemoglobin 11.5 (*)    HCT 34.5 (*)    All other components within normal limits  COMPREHENSIVE METABOLIC PANEL - Abnormal; Notable for the following components:   Glucose, Bld 125 (*)    Albumin 2.9 (*)    All other components within normal limits  TROPONIN I  MAGNESIUM  TSH  TROPONIN I   ____________________________________________  EKG  ED ECG REPORT I, Rebecka ApleyWebster,  Ashleah Valtierra P, the attending physician, personally viewed and interpreted this  ECG.   Date: 11/19/2017  EKG Time: 2119  Rate: 75  Rhythm: normal sinus rhythm  Axis: normal  Intervals:none  ST&T Change: none  ____________________________________________  RADIOLOGY  No results found.  ____________________________________________   PROCEDURES  Procedure(s) performed: None  Procedures  Critical Care performed: No  ____________________________________________   INITIAL IMPRESSION / ASSESSMENT AND PLAN / ED COURSE  As part of my medical decision making, I reviewed the following data within the electronic MEDICAL RECORD NUMBER Notes from prior ED visits and [place] Controlled Substance Database   This is a 36 year old female who is [redacted] weeks pregnant and comes in today with some palpitations.  She had some lightheadedness and dizziness along with the symptoms so  she decided to come in and get checked out.  The patient does have a history of hypothyroid although she does not take any medications for it.  I initially checked a CBC CMP and troponin which were negative.  I will add on a magnesium and a TSH to her blood work and repeat the troponin.  The patient's not having any symptoms at this time but she may have had some PVCs brought on by caffeine intake, pregnancy or thyroid levels.  The patient will be reassessed once I receive her results.     The patient's TSH troponin and mag returned.  The patient's magnesium is 1.8 but otherwise the remaining blood work is unremarkable.  The patient is to follow-up with her OB/GYN for further evaluation of these palpitations.  She needs to ensure that she is maintaining adequate hydration status and she needs to cut back on her caffeine intake.  The patient will be discharged home to follow-up with her OB/GYN. ____________________________________________   FINAL CLINICAL IMPRESSION(S) / ED DIAGNOSES  Final diagnoses:  Palpitations     ED Discharge Orders    None       Note:  This document was prepared using  Dragon voice recognition software and may include unintentional dictation errors.    Rebecka Apley, MD 11/20/17 0157

## 2017-11-20 NOTE — ED Notes (Signed)

## 2017-11-21 ENCOUNTER — Ambulatory Visit (INDEPENDENT_AMBULATORY_CARE_PROVIDER_SITE_OTHER): Payer: 59 | Admitting: Obstetrics and Gynecology

## 2017-11-21 ENCOUNTER — Ambulatory Visit (INDEPENDENT_AMBULATORY_CARE_PROVIDER_SITE_OTHER): Payer: 59

## 2017-11-21 VITALS — BP 124/80 | Wt 250.0 lb

## 2017-11-21 DIAGNOSIS — Z0489 Encounter for examination and observation for other specified reasons: Secondary | ICD-10-CM

## 2017-11-21 DIAGNOSIS — O09299 Supervision of pregnancy with other poor reproductive or obstetric history, unspecified trimester: Secondary | ICD-10-CM

## 2017-11-21 DIAGNOSIS — Z113 Encounter for screening for infections with a predominantly sexual mode of transmission: Secondary | ICD-10-CM

## 2017-11-21 DIAGNOSIS — Z3A24 24 weeks gestation of pregnancy: Secondary | ICD-10-CM

## 2017-11-21 DIAGNOSIS — R002 Palpitations: Secondary | ICD-10-CM

## 2017-11-21 DIAGNOSIS — IMO0002 Reserved for concepts with insufficient information to code with codable children: Secondary | ICD-10-CM

## 2017-11-21 DIAGNOSIS — O09522 Supervision of elderly multigravida, second trimester: Secondary | ICD-10-CM

## 2017-11-21 DIAGNOSIS — O09529 Supervision of elderly multigravida, unspecified trimester: Secondary | ICD-10-CM

## 2017-11-21 NOTE — Progress Notes (Signed)
Routine Prenatal Care Visit  Subjective  Kimberly Mcfarland is a 36 y.o. G2P1000 at 6852w4d being seen today for ongoing prenatal care.  She is currently monitored for the following issues for this high-risk pregnancy and has Advanced maternal age in multigravida; Supervision of high-risk pregnancy of elderly multigravida (>= 36 years old at time of delivery); and Hx of preeclampsia, prior pregnancy, currently pregnant on their problem list.  ----------------------------------------------------------------------------------- Patient reports no complaints.  Was seen in ER this week for palpitations with normal work up no arrhythmia noted, normal enzymes.  No chest pain  Contractions: Not present. Vag. Bleeding: None.  Movement: Present. Denies leaking of fluid.  ----------------------------------------------------------------------------------- The following portions of the patient's history were reviewed and updated as appropriate: allergies, current medications, past family history, past medical history, past social history, past surgical history and problem list. Problem list updated.   Objective  Blood pressure 124/80, weight 250 lb (113.4 kg), last menstrual period 05/09/2017. Pregravid weight 245 lb (111.1 kg) Total Weight Gain 5 lb (2.268 kg) Urinalysis: Urine Protein: Negative Urine Glucose: Negative  Fetal Status: Fetal Heart Rate (bpm): 145 Fundal Height: 25 cm Movement: Present     General:  Alert, oriented and cooperative. Patient is in no acute distress.  Skin: Skin is warm and dry. No rash noted.   Cardiovascular: Normal heart rate noted  Respiratory: Normal respiratory effort, no problems with respiration noted  Abdomen: Soft, gravid, appropriate for gestational age. Pain/Pressure: Absent     Pelvic:  Cervical exam deferred        Extremities: Normal range of motion.     ental Status: Normal mood and affect. Normal behavior. Normal judgment and thought content.    There is  no immunization history on file for this patient.   Assessment   36 y.o. G2P1000 at 6552w4d by  03/09/2018, by Ultrasound presenting for routine prenatal visit  Plan   Pregnancy #2 Problems (from 05/09/17 to present)    Problem Noted Resolved   Advanced maternal age in multigravida 09/12/2017 by Vena AustriaStaebler, Antavion Bartoszek, MD No   Overview Addendum 09/25/2017  1:04 PM by Vena AustriaStaebler, Loui Massenburg, MD    Accepts InformaSeq drawn 09/12/17 normal XY      Supervision of high-risk pregnancy of elderly multigravida (>= 36 years old at time of delivery) 09/12/2017 by Vena AustriaStaebler, Cherica Heiden, MD No   Overview Addendum 11/21/2017  9:16 AM by Vena AustriaStaebler, Sya Nestler, MD    Clinic [facility] Prenatal Labs  Dating 10 week US Blood type: A pos  Genetic Screen Informaseq normal XY Antibody:negative  Anatomic US Remains incomplete for face and RVOT at 24 week follow up [X]  DP referral Rubella: Immune   Varicella: Immune  GTT Early:  Opted for 3-hr with 4/4 values normal             Third trimester:  RPR: NR  Rhogam NA HBsAg: negative  TDaP vaccine                       Flu Shot: HIV: negative  Baby Food                                GBS:  Contraception  Pap: 08/18/17 ASCUS HPV negative  CBB     CS/VBAC    Support Person               Hx of preeclampsia, prior pregnancy, currently pregnant 09/12/2017  by Vena AustriaStaebler, Audie Wieser, MD No   Overview Signed 09/12/2017  8:36 AM by Vena AustriaStaebler, Josha Weekley, MD    Start low dose ASA at >[redacted] weeks gestation as per USPTF recommendation "Low-Dose Aspirin Use for the Prevention of Morbidity and Mortality From Preeclampsia: Preventive Medicine"  furthermore endorsed by ACOG, WHO, and NIH based on evidence level B for the prevention of preeclampsia  In women deemed high risk  (diabetes, renal disease, chronic hypertension, history of preeclampsia in prior gestation, autoimmune diseases, or multifetal gestations).  ACOG Committee Opinion 743 "Low-Dose Asprin Use During Pregnancy" June 25th 2018  High Risk  (Start if 1 or more present) History of preeclampsia Multifetal Gestation Chronic HTN Type I or II DM Renal Disease Autoimmune Disease (SLE, Antiphospholipid antibody syndrome)  Moderate Risk (consider starting if more than one present) Nulliparity Obesity (BMI >30) Family history of Preeclampsia (Mother or sister) Socioeconomic characteristics (African American, low socieeconomic status) Age 44 years or older Personal history factors (low birthweight of SGA, previous adverse pregnancy outcome, more than 10 year pregnancy interval)           Preterm labor symptoms and general obstetric precautions including but not limited to vaginal bleeding, contractions, leaking of fluid and fetal movement were reviewed in detail with the patient. Please refer to After Visit Summary for other counseling recommendations.  - anatomy scan remains incomplete for face and RVOT DP referral - cardiology referral for arhythmia, discussed most likely etiology being PVCs, no family or personal history of cardiac issues  Return in about 4 weeks (around 12/19/2017) for ROB and 28 week labs.

## 2017-11-21 NOTE — Progress Notes (Signed)
Repeat anatomy scan today. No vb. No lof. Pt went to ER Monday for what she felt like was irregular heart beats.

## 2017-11-23 ENCOUNTER — Other Ambulatory Visit: Payer: Self-pay | Admitting: Obstetrics and Gynecology

## 2017-11-23 DIAGNOSIS — Z3689 Encounter for other specified antenatal screening: Secondary | ICD-10-CM

## 2017-12-06 ENCOUNTER — Ambulatory Visit
Admission: RE | Admit: 2017-12-06 | Discharge: 2017-12-06 | Disposition: A | Discharge status: Home / Self Care | Payer: 59 | Source: Ambulatory Visit | Attending: Obstetrics and Gynecology | Admitting: Obstetrics and Gynecology

## 2017-12-06 ENCOUNTER — Encounter: Payer: Self-pay | Admitting: Obstetrics and Gynecology

## 2017-12-06 DIAGNOSIS — O09299 Supervision of pregnancy with other poor reproductive or obstetric history, unspecified trimester: Secondary | ICD-10-CM

## 2017-12-06 DIAGNOSIS — Z3A26 26 weeks gestation of pregnancy: Secondary | ICD-10-CM | POA: Diagnosis not present

## 2017-12-06 DIAGNOSIS — Z3689 Encounter for other specified antenatal screening: Secondary | ICD-10-CM | POA: Diagnosis not present

## 2017-12-06 DIAGNOSIS — O09529 Supervision of elderly multigravida, unspecified trimester: Secondary | ICD-10-CM

## 2017-12-06 DIAGNOSIS — O09522 Supervision of elderly multigravida, second trimester: Secondary | ICD-10-CM

## 2017-12-19 ENCOUNTER — Other Ambulatory Visit: Payer: 59

## 2017-12-19 ENCOUNTER — Ambulatory Visit (INDEPENDENT_AMBULATORY_CARE_PROVIDER_SITE_OTHER): Payer: 59 | Admitting: Obstetrics & Gynecology

## 2017-12-19 ENCOUNTER — Telehealth: Payer: Self-pay | Admitting: Obstetrics and Gynecology

## 2017-12-19 VITALS — BP 120/70 | Wt 249.0 lb

## 2017-12-19 DIAGNOSIS — O09522 Supervision of elderly multigravida, second trimester: Secondary | ICD-10-CM

## 2017-12-19 DIAGNOSIS — O09529 Supervision of elderly multigravida, unspecified trimester: Secondary | ICD-10-CM

## 2017-12-19 DIAGNOSIS — I493 Ventricular premature depolarization: Secondary | ICD-10-CM

## 2017-12-19 DIAGNOSIS — Z113 Encounter for screening for infections with a predominantly sexual mode of transmission: Secondary | ICD-10-CM

## 2017-12-19 DIAGNOSIS — O09523 Supervision of elderly multigravida, third trimester: Secondary | ICD-10-CM

## 2017-12-19 DIAGNOSIS — O09299 Supervision of pregnancy with other poor reproductive or obstetric history, unspecified trimester: Secondary | ICD-10-CM

## 2017-12-19 DIAGNOSIS — Z3A28 28 weeks gestation of pregnancy: Secondary | ICD-10-CM

## 2017-12-19 NOTE — Telephone Encounter (Signed)
Pt aware.

## 2017-12-19 NOTE — Patient Instructions (Signed)
What You Need to Know About Female Sterilization Female sterilization is surgery to prevent pregnancy. In this surgery, the fallopian tubes are either blocked or closed off. This prevents eggs from reaching the uterus so that the eggs cannot be fertilized by sperm and you cannot get pregnant. Sterilization is permanent. It should only be done if you are sure that you do not want to be able to have children. What are the sterilization surgery options? There are several kinds of female sterilization surgeries. They include:  Laparoscopic tubal ligation. In this surgery, the fallopian tubes are tied off, sealed with heat, or blocked with a clip, ring, or clamp. A small portion of each fallopian tube may also be removed. This surgery is done through several small cuts (incisions).  Postpartum tubal ligation. This is also called a mini-laparotomy. This surgery is done right after childbirth or 1 or 2 days after childbirth. In this surgery, the fallopian tubes are tied off, sealed with heat, or blocked with a clip, ring, or clamp. A small portion of each fallopian tube may also be removed. The surgery is done through a single incision.  Hysteroscopic sterilization. In this surgery, a tiny, spring-like coil is inserted through the cervix and uterus into the fallopian tubes. The coil causes scarring, which blocks the tubes. After the surgery, contraception should be used for 3 months to allow the scar tissue to form completely.  Is sterilization safe? Generally, sterilization is safe. Complications are rare. However, there are risks. They include:  Bleeding.  Infection.  Reaction to medicine used during the procedure.  Injury to surrounding organs.  Failure of the procedure.  How effective is sterilization? Sterilization is nearly 100% effective, but it can fail. Also, the fallopian tubes can grow back together over time. If this happens, you will be able to get pregnant again. Women who have had  this procedure have a higher chance of having an ectopic pregnancy. An ectopic pregnancy is a pregnancy that happens outside of the uterus. This kind of pregnancy is unsuccessful and can lead to serious bleeding if it is not treated. What are the benefits?  It is usually effective for a lifetime.  It is usually safe.  It does not have the drawbacks of other types of birth control: That means: ? Your hormones are not affected. Because of this, your menstrual periods, sexual desire, and sexual performance will not be affected. ? There are no side effects. What are the drawbacks?  If you change your mind and decide that you want to have children, you may not be able to. Sterilization may be reversed, but a reversal is not always successful.  It does not provide protection against STDs (sexually transmitted diseases).  It increases the chance of having an ectopic pregnancy. This information is not intended to replace advice given to you by your health care provider. Make sure you discuss any questions you have with your health care provider. Document Released: 05/29/2008 Document Revised: 08/03/2016 Document Reviewed: 09/07/2015 Elsevier Interactive Patient Education  2018 Elsevier Inc.  

## 2017-12-19 NOTE — Telephone Encounter (Signed)
lmtrc

## 2017-12-19 NOTE — Progress Notes (Signed)
  Subjective  Fetal Movement? yes Contractions? no Leaking Fluid? no Vaginal Bleeding? No Expressed desire for PP BTL.  Wants to breast feed.  Objective  BP 120/70   Wt 249 lb (112.9 kg)   LMP 05/09/2017   BMI 44.11 kg/m  General: NAD Pumonary: no increased work of breathing Abdomen: gravid, non-tender Extremities: no edema Psychiatric: mood appropriate, affect full  Assessment  36 y.o. G2P1001 at 3630w4d by  03/09/2018, by Ultrasound presenting for routine prenatal visit  Plan   Problem List Items Addressed This Visit      Other   Advanced maternal age in multigravida   Supervision of high-risk pregnancy of elderly multigravida (>= 36 years old at time of delivery)   Hx of preeclampsia, prior pregnancy, currently pregnant    Other Visit Diagnoses    [redacted] weeks gestation of pregnancy    -  Primary    Desires BTL, counseled The patient has been fully informed about all methods of contraception, both temporary and permanent. She understands that tubal ligation is meant to be permanent, absolute and irreversible. She was told that there is an approximately 1 in 400 chance of a pregnancy in the future after tubal ligation. She was told the short and long term complications of tubal ligation. She understands the risks from this surgery include, but are not limited to, the risks of anesthesia, hemorrhage, infection, perforation, and injury to adjacent structures, bowel, bladder and blood vessels.  Breast feeding plans Glucola today (prior abn glucola first trimester, normal 3 hr GTT then) Flu shot UTD  Annamarie MajorPaul Harris, MD, Merlinda FrederickFACOG [facility], [facility] 12/19/2017  8:24 AM

## 2017-12-19 NOTE — Telephone Encounter (Signed)
Patient states when she saw AMS in November he was putting in for cardiology referral but she hasn't heard from anyone regarding that?

## 2017-12-19 NOTE — Telephone Encounter (Signed)
Referral update sent to Harriett Sineancy Let pt know she will address it on Monday

## 2017-12-19 NOTE — Telephone Encounter (Signed)
Dr Bonney AidStaebler put in for this? --- cardiology referral for arhythmia, discussed most likely etiology being PVCs, no family or personal history of cardiac issues --- pregnant [redacted] weeks now  Please arrange in network referral soon please and let her know  PH

## 2017-12-20 ENCOUNTER — Telehealth: Payer: Self-pay | Admitting: Obstetrics and Gynecology

## 2017-12-20 ENCOUNTER — Other Ambulatory Visit: Payer: Self-pay | Admitting: Obstetrics and Gynecology

## 2017-12-20 DIAGNOSIS — O99810 Abnormal glucose complicating pregnancy: Secondary | ICD-10-CM

## 2017-12-20 LAB — 28 WEEK RH+PANEL
BASOS: 0 %
Basophils Absolute: 0 10*3/uL (ref 0.0–0.2)
EOS (ABSOLUTE): 0.2 10*3/uL (ref 0.0–0.4)
EOS: 1 %
GESTATIONAL DIABETES SCREEN: 174 mg/dL — AB (ref 65–139)
HIV SCREEN 4TH GENERATION: NONREACTIVE
Hematocrit: 32.4 % — ABNORMAL LOW (ref 34.0–46.6)
Hemoglobin: 10.8 g/dL — ABNORMAL LOW (ref 11.1–15.9)
IMMATURE GRANS (ABS): 0 10*3/uL (ref 0.0–0.1)
IMMATURE GRANULOCYTES: 0 %
Lymphocytes Absolute: 1.7 10*3/uL (ref 0.7–3.1)
Lymphs: 14 %
MCH: 29.4 pg (ref 26.6–33.0)
MCHC: 33.3 g/dL (ref 31.5–35.7)
MCV: 88 fL (ref 79–97)
MONOCYTES: 4 %
Monocytes Absolute: 0.5 10*3/uL (ref 0.1–0.9)
NEUTROS PCT: 81 %
Neutrophils Absolute: 9.9 10*3/uL — ABNORMAL HIGH (ref 1.4–7.0)
Platelets: 280 10*3/uL (ref 150–379)
RBC: 3.67 x10E6/uL — AB (ref 3.77–5.28)
RDW: 14.3 % (ref 12.3–15.4)
RPR: NONREACTIVE
WBC: 12.4 10*3/uL — AB (ref 3.4–10.8)

## 2017-12-20 NOTE — Telephone Encounter (Signed)
Pt is schedule 01/01/17

## 2017-12-20 NOTE — Telephone Encounter (Signed)
-----   Message from Vena AustriaAndreas Staebler, MD sent at 12/20/2017  1:37 PM EST ----- Regarding: 3-hr glucose Needs 3-hr glucose tolerance test scheduled in the next week

## 2017-12-25 NOTE — L&D Delivery Note (Signed)
Delivery Note At 8:40 AM a viable female infant was delivered via Vaginal, Spontaneous (Presentation: ROA).  APGAR: 8, 9; weight 3,250 grams (7 lb 3 oz).   Placenta status: delivered spontaneously, intact.  Cord: 3VC with the following complications: true knot.  Cord pH: not sent  Anesthesia:  epidural Episiotomy: None Lacerations: None Suture Repair: n/a Est. Blood Loss (mL): 499  Mom to postpartum.  Baby to Couplet care / Skin to Skin.  Called to see patient.  Mom pushed to deliver a viable female infant.  The head followed by shoulders, which delivered without difficulty, and the rest of the body.  No nuchal cord noted.  However, a true knot was noted.  Baby to mom's chest.  Cord clamped and cut after > 1 min delay.  No cord blood obtained.  Placenta delivered spontaneously, intact, with a 3-vessel cord.  No vaginal, cervical, or perineal lacerations. All counts correct.  Hemostasis obtained with IV pitocin and fundal massage. EBL 499 mL.     Thomasene MohairStephen Geena Weinhold, MD 02/18/2018, 8:56 AM

## 2018-01-01 ENCOUNTER — Other Ambulatory Visit: Payer: 59

## 2018-01-01 DIAGNOSIS — O99810 Abnormal glucose complicating pregnancy: Secondary | ICD-10-CM

## 2018-01-02 ENCOUNTER — Ambulatory Visit (INDEPENDENT_AMBULATORY_CARE_PROVIDER_SITE_OTHER): Payer: 59 | Admitting: Advanced Practice Midwife

## 2018-01-02 ENCOUNTER — Encounter: Payer: Self-pay | Admitting: Advanced Practice Midwife

## 2018-01-02 VITALS — BP 124/74 | Wt 250.0 lb

## 2018-01-02 DIAGNOSIS — Z23 Encounter for immunization: Secondary | ICD-10-CM

## 2018-01-02 DIAGNOSIS — Z3A30 30 weeks gestation of pregnancy: Secondary | ICD-10-CM

## 2018-01-02 LAB — GESTATIONAL GLUCOSE TOLERANCE
GLUCOSE 1 HOUR GTT: 158 mg/dL (ref 65–179)
GLUCOSE FASTING: 86 mg/dL (ref 65–94)
Glucose, GTT - 2 Hour: 158 mg/dL — ABNORMAL HIGH (ref 65–154)
Glucose, GTT - 3 Hour: 112 mg/dL (ref 65–139)

## 2018-01-02 NOTE — Progress Notes (Signed)
Routine Prenatal Care Visit  Subjective  Kimberly Mcfarland is a 37 y.o. G2P1001 at [redacted]w[redacted]d being seen today for ongoing prenatal care.  She is currently monitored for the following issues for this high-risk pregnancy and has Advanced maternal age in multigravida; Supervision of high-risk pregnancy of elderly multigravida (>= 34 years old at time of delivery); and Hx of preeclampsia, prior pregnancy, currently pregnant on their problem list.  ----------------------------------------------------------------------------------- Patient reports no complaints.   Contractions: Not present. Vag. Bleeding: None.  Movement: Present. Denies leaking of fluid.  ----------------------------------------------------------------------------------- The following portions of the patient's history were reviewed and updated as appropriate: allergies, current medications, past family history, past medical history, past social history, past surgical history and problem list. Problem list updated.   Objective  Blood pressure 124/74, weight 250 lb (113.4 kg), last menstrual period 05/09/2017. Pregravid weight 245 lb (111.1 kg) Total Weight Gain 5 lb (2.268 kg) Urinalysis: Urine Protein: Negative Urine Glucose: Negative  Fetal Status:     Movement: Present     General:  Alert, oriented and cooperative. Patient is in no acute distress.  Skin: Skin is warm and dry. No rash noted.   Cardiovascular: Normal heart rate noted  Respiratory: Normal respiratory effort, no problems with respiration noted  Abdomen: Soft, gravid, appropriate for gestational age. Pain/Pressure: Absent     Pelvic:  Cervical exam deferred        Extremities: Normal range of motion.     Mental Status: Normal mood and affect. Normal behavior. Normal judgment and thought content.   Assessment   37 y.o. G2P1001 at [redacted]w[redacted]d by  03/09/2018, by Ultrasound presenting for routine prenatal visit  Plan   Pregnancy #2 Problems (from 05/09/17 to present)    Problem Noted Resolved   Advanced maternal age in multigravida 09/12/2017 by [name], MD No   Overview Addendum 09/25/2017  1:04 PM by [name], MD    Accepts InformaSeq drawn 09/12/17 normal XY      Supervision of high-risk pregnancy of elderly multigravida (>= 75 years old at time of delivery) 09/12/2017 by [name], MD No   Overview Addendum 12/20/2017  1:36 PM by [name], MD    [facility]inic [facility] Prenatal Labs  Dating 10 week [place] Blood type: A pos  Genetic Screen Informaseq normal XY Antibody:negative  Anatomic [place] Remains incomplete for face and RVOT at 24 week follow up [X]  DP referral normal heart views Rubella: Immune   Varicella: Immune  GTT Early:  Opted for 3-hr with 4/4 values normal             Third trimester: 174 RPR: NR  Rhogam NA HBsAg: negative  TDaP vaccine 01/02/18 Flu Shot: UTD HIV: negative  Baby Food                                GBS:  Contraception BTL Pap: 08/18/17 ASCUS HPV negative  CBB     CS/VBAC NA   Support Person               Hx of preeclampsia, prior pregnancy, currently pregnant 09/12/2017 by [name], MD No   Overview Signed 09/12/2017  8:36 AM by [name], MD    Start low dose ASA at >[redacted] weeks gestation as per USPTF recommendation "Low-Dose Aspirin Use for the Prevention of Morbidity and Mortality From Preeclampsia: Preventive Medicine"  furthermore endorsed by ACOG, WHO, and NIH based on evidence level B  for the prevention of preeclampsia  In women deemed high risk  (diabetes, renal disease, chronic hypertension, history of preeclampsia in prior gestation, autoimmune diseases, or multifetal gestations).  ACOG Committee Opinion 743 "Low-Dose Asprin Use During Pregnancy" June 25th 2018  High Risk (Start if 1 or more present) History of preeclampsia Multifetal Gestation Chronic HTN Type I or II DM Renal Disease Autoimmune Disease (SLE, Antiphospholipid antibody syndrome)  Moderate Risk (consider  starting if more than one present) Nulliparity Obesity (BMI >30) Family history of Preeclampsia (Mother or sister) Socioeconomic characteristics (African American, low socieeconomic status) Age 39 years or older Personal history factors (low birthweight of SGA, previous adverse pregnancy outcome, more than 10 year pregnancy interval)           Preterm labor symptoms and general obstetric precautions including but not limited to vaginal bleeding, contractions, leaking of fluid and fetal movement were reviewed in detail with the patient. Please refer to After Visit Summary for other counseling recommendations.   Return in about 2 weeks (around 01/16/2018) for rob.  Kimberly Mcfarland, CNM  01/02/2018 9:05 AM

## 2018-01-02 NOTE — Patient Instructions (Signed)
Third Trimester of Pregnancy The third trimester is from week 28 through week 40 (months 7 through 9). The third trimester is a time when the unborn baby (fetus) is growing rapidly. At the end of the ninth month, the fetus is about 20 inches in length and weighs 6-10 pounds. Body changes during your third trimester Your body will continue to go through many changes during pregnancy. The changes vary from woman to woman. During the third trimester:  Your weight will continue to increase. You can expect to gain 25-35 pounds (11-16 kg) by the end of the pregnancy.  You may begin to get stretch marks on your hips, abdomen, and breasts.  You may urinate more often because the fetus is moving lower into your pelvis and pressing on your bladder.  You may develop or continue to have heartburn. This is caused by increased hormones that slow down muscles in the digestive tract.  You may develop or continue to have constipation because increased hormones slow digestion and cause the muscles that push waste through your intestines to relax.  You may develop hemorrhoids. These are swollen veins (varicose veins) in the rectum that can itch or be painful.  You may develop swollen, bulging veins (varicose veins) in your legs.  You may have increased body aches in the pelvis, back, or thighs. This is due to weight gain and increased hormones that are relaxing your joints.  You may have changes in your hair. These can include thickening of your hair, rapid growth, and changes in texture. Some women also have hair loss during or after pregnancy, or hair that feels dry or thin. Your hair will most likely return to normal after your baby is born.  Your breasts will continue to grow and they will continue to become tender. A yellow fluid (colostrum) may leak from your breasts. This is the first milk you are producing for your baby.  Your belly button may stick out.  You may notice more swelling in your hands,  face, or ankles.  You may have increased tingling or numbness in your hands, arms, and legs. The skin on your belly may also feel numb.  You may feel short of breath because of your expanding uterus.  You may have more problems sleeping. This can be caused by the size of your belly, increased need to urinate, and an increase in your body's metabolism.  You may notice the fetus "dropping," or moving lower in your abdomen (lightening).  You may have increased vaginal discharge.  You may notice your joints feel loose and you may have pain around your pelvic bone.  What to expect at prenatal visits You will have prenatal exams every 2 weeks until week 36. Then you will have weekly prenatal exams. During a routine prenatal visit:  You will be weighed to make sure you and the baby are growing normally.  Your blood pressure will be taken.  Your abdomen will be measured to track your baby's growth.  The fetal heartbeat will be listened to.  Any test results from the previous visit will be discussed.  You may have a cervical check near your due date to see if your cervix has softened or thinned (effaced).  You will be tested for Group B streptococcus. This happens between 35 and 37 weeks.  Your health care provider may ask you:  What your birth plan is.  How you are feeling.  If you are feeling the baby move.  If you have had   any abnormal symptoms, such as leaking fluid, bleeding, severe headaches, or abdominal cramping.  If you are using any tobacco products, including cigarettes, chewing tobacco, and electronic cigarettes.  If you have any questions.  Other tests or screenings that may be performed during your third trimester include:  Blood tests that check for low iron levels (anemia).  Fetal testing to check the health, activity level, and growth of the fetus. Testing is done if you have certain medical conditions or if there are problems during the  pregnancy.  Nonstress test (NST). This test checks the health of your baby to make sure there are no signs of problems, such as the baby not getting enough oxygen. During this test, a belt is placed around your belly. The baby is made to move, and its heart rate is monitored during movement.  What is false labor? False labor is a condition in which you feel small, irregular tightenings of the muscles in the womb (contractions) that usually go away with rest, changing position, or drinking water. These are called Braxton Hicks contractions. Contractions may last for hours, days, or even weeks before true labor sets in. If contractions come at regular intervals, become more frequent, increase in intensity, or become painful, you should see your health care provider. What are the signs of labor?  Abdominal cramps.  Regular contractions that start at 10 minutes apart and become stronger and more frequent with time.  Contractions that start on the top of the uterus and spread down to the lower abdomen and back.  Increased pelvic pressure and dull back pain.  A watery or bloody mucus discharge that comes from the vagina.  Leaking of amniotic fluid. This is also known as your "water breaking." It could be a slow trickle or a gush. Let your health care provider know if it has a color or strange odor. If you have any of these signs, call your health care provider right away, even if it is before your due date. Follow these instructions at home: Medicines  Follow your health care provider's instructions regarding medicine use. Specific medicines may be either safe or unsafe to take during pregnancy.  Take a prenatal vitamin that contains at least 600 micrograms (mcg) of folic acid.  If you develop constipation, try taking a stool softener if your health care provider approves. Eating and drinking  Eat a balanced diet that includes fresh fruits and vegetables, whole grains, good sources of protein  such as meat, eggs, or tofu, and low-fat dairy. Your health care provider will help you determine the amount of weight gain that is right for you.  Avoid raw meat and uncooked cheese. These carry germs that can cause birth defects in the baby.  If you have low calcium intake from food, talk to your health care provider about whether you should take a daily calcium supplement.  Eat four or five small meals rather than three large meals a day.  Limit foods that are high in fat and processed sugars, such as fried and sweet foods.  To prevent constipation: ? Drink enough fluid to keep your urine clear or pale yellow. ? Eat foods that are high in fiber, such as fresh fruits and vegetables, whole grains, and beans. Activity  Exercise only as directed by your health care provider. Most women can continue their usual exercise routine during pregnancy. Try to exercise for 30 minutes at least 5 days a week. Stop exercising if you experience uterine contractions.  Avoid heavy   lifting.  Do not exercise in extreme heat or humidity, or at high altitudes.  Wear low-heel, comfortable shoes.  Practice good posture.  You may continue to have sex unless your health care provider tells you otherwise. Relieving pain and discomfort  Take frequent breaks and rest with your legs elevated if you have leg cramps or low back pain.  Take warm sitz baths to soothe any pain or discomfort caused by hemorrhoids. Use hemorrhoid cream if your health care provider approves.  Wear a good support bra to prevent discomfort from breast tenderness.  If you develop varicose veins: ? Wear support pantyhose or compression stockings as told by your healthcare provider. ? Elevate your feet for 15 minutes, 3-4 times a day. Prenatal care  Write down your questions. Take them to your prenatal visits.  Keep all your prenatal visits as told by your health care provider. This is important. Safety  Wear your seat belt at  all times when driving.  Make a list of emergency phone numbers, including numbers for family, friends, the hospital, and police and fire departments. General instructions  Avoid cat litter boxes and soil used by cats. These carry germs that can cause birth defects in the baby. If you have a cat, ask someone to clean the litter box for you.  Do not travel far distances unless it is absolutely necessary and only with the approval of your health care provider.  Do not use hot tubs, steam rooms, or saunas.  Do not drink alcohol.  Do not use any products that contain nicotine or tobacco, such as cigarettes and e-cigarettes. If you need help quitting, ask your health care provider.  Do not use any medicinal herbs or unprescribed drugs. These chemicals affect the formation and growth of the baby.  Do not douche or use tampons or scented sanitary pads.  Do not cross your legs for long periods of time.  To prepare for the arrival of your baby: ? Take prenatal classes to understand, practice, and ask questions about labor and delivery. ? Make a trial run to the hospital. ? Visit the hospital and tour the maternity area. ? Arrange for maternity or paternity leave through employers. ? Arrange for family and friends to take care of pets while you are in the hospital. ? Purchase a rear-facing car seat and make sure you know how to install it in your car. ? Pack your hospital bag. ? Prepare the baby's nursery. Make sure to remove all pillows and stuffed animals from the baby's crib to prevent suffocation.  Visit your dentist if you have not gone during your pregnancy. Use a soft toothbrush to brush your teeth and be gentle when you floss. Contact a health care provider if:  You are unsure if you are in labor or if your water has broken.  You become dizzy.  You have mild pelvic cramps, pelvic pressure, or nagging pain in your abdominal area.  You have lower back pain.  You have persistent  nausea, vomiting, or diarrhea.  You have an unusual or bad smelling vaginal discharge.  You have pain when you urinate. Get help right away if:  Your water breaks before 37 weeks.  You have regular contractions less than 5 minutes apart before 37 weeks.  You have a fever.  You are leaking fluid from your vagina.  You have spotting or bleeding from your vagina.  You have severe abdominal pain or cramping.  You have rapid weight loss or weight gain.    You have shortness of breath with chest pain.  You notice sudden or extreme swelling of your face, hands, ankles, feet, or legs.  Your baby makes fewer than 10 movements in 2 hours.  You have severe headaches that do not go away when you take medicine.  You have vision changes. Summary  The third trimester is from week 28 through week 40, months 7 through 9. The third trimester is a time when the unborn baby (fetus) is growing rapidly.  During the third trimester, your discomfort may increase as you and your baby continue to gain weight. You may have abdominal, leg, and back pain, sleeping problems, and an increased need to urinate.  During the third trimester your breasts will keep growing and they will continue to become tender. A yellow fluid (colostrum) may leak from your breasts. This is the first milk you are producing for your baby.  False labor is a condition in which you feel small, irregular tightenings of the muscles in the womb (contractions) that eventually go away. These are called Braxton Hicks contractions. Contractions may last for hours, days, or even weeks before true labor sets in.  Signs of labor can include: abdominal cramps; regular contractions that start at 10 minutes apart and become stronger and more frequent with time; watery or bloody mucus discharge that comes from the vagina; increased pelvic pressure and dull back pain; and leaking of amniotic fluid. This information is not intended to replace advice  given to you by your health care provider. Make sure you discuss any questions you have with your health care provider. Document Released: 12/05/2001 Document Revised: 05/18/2016 Document Reviewed: 02/11/2013 Elsevier Interactive Patient Education  2017 [employer]  

## 2018-01-02 NOTE — Progress Notes (Signed)
TDAP today. No vb. No lof.  °

## 2018-01-07 ENCOUNTER — Encounter: Payer: Self-pay | Admitting: General Surgery

## 2018-01-16 ENCOUNTER — Encounter: Payer: Self-pay | Admitting: Maternal Newborn

## 2018-01-16 ENCOUNTER — Ambulatory Visit (INDEPENDENT_AMBULATORY_CARE_PROVIDER_SITE_OTHER): Payer: 59 | Admitting: Maternal Newborn

## 2018-01-16 VITALS — BP 130/80 | Wt 253.0 lb

## 2018-01-16 DIAGNOSIS — O09529 Supervision of elderly multigravida, unspecified trimester: Secondary | ICD-10-CM

## 2018-01-16 DIAGNOSIS — Z3A32 32 weeks gestation of pregnancy: Secondary | ICD-10-CM

## 2018-01-16 NOTE — Progress Notes (Signed)
Routine Prenatal Care Visit  Subjective  Kimberly Mcfarland is a 37 y.o. G2P1001 at 6662w4d being seen today for ongoing prenatal care.  She is currently monitored for the following issues for this high-risk pregnancy and has Advanced maternal age in multigravida; Supervision of high-risk pregnancy of elderly multigravida (>= 37 years old at time of delivery); and Hx of preeclampsia, prior pregnancy, currently pregnant on their problem list.  ----------------------------------------------------------------------------------- Patient reports no complaints.   Contractions: Not present. Vag. Bleeding: None.  Movement: Present. Denies leaking of fluid.  ----------------------------------------------------------------------------------- The following portions of the patient's history were reviewed and updated as appropriate: allergies, current medications, past family history, past medical history, past social history, past surgical history and problem list. Problem list updated.   Objective  Last menstrual period 05/09/2017. Pregravid weight 245 lb (111.1 kg) Total Weight Gain 8 lb (3.629 kg) Urinalysis: Urine Protein: Negative Urine Glucose: Negative  Fetal Status: Fetal Heart Rate (bpm): 141 Fundal Height: 33 cm Movement: Present     General:  Alert, oriented and cooperative. Patient is in no acute distress.  Skin: Skin is warm and dry. No rash noted.   Cardiovascular: Normal heart rate noted  Respiratory: Normal respiratory effort, no problems with respiration noted  Abdomen: Soft, gravid, appropriate for gestational age. Pain/Pressure: Absent     Pelvic:  Cervical exam deferred        Extremities: Normal range of motion.     Mental Status: Normal mood and affect. Normal behavior. Normal judgment and thought content.     Assessment   37 y.o. G2P1001 at 7362w4d, EDD 03/09/2018 by Ultrasound presenting for routine prenatal visit.  Plan   Pregnancy #2 Problems (from 05/09/17 to present)      Problem Noted Resolved   Advanced maternal age in multigravida 09/12/2017 by Vena AustriaStaebler, Andreas, MD No   Overview Addendum 09/25/2017  1:04 PM by Vena AustriaStaebler, Andreas, MD    Accepts InformaSeq drawn 09/12/17 normal XY      Supervision of high-risk pregnancy of elderly multigravida (>= 37 years old at time of delivery) 09/12/2017 by Vena AustriaStaebler, Andreas, MD No   Overview Addendum 01/03/2018  5:04 PM by Vena AustriaStaebler, Andreas, MD    [facility]inic [facility] Prenatal Labs  Dating 10 week [place] Blood type: A pos  Genetic Screen Informaseq normal XY Antibody:negative  Anatomic [place] Remains incomplete for face and RVOT at 24 week follow up [X]  DP referral normal heart views Rubella: Immune   Varicella: Immune  GTT Early:  Opted for 3-hr with 4/4 values normal             Third trimester: 174 3-hr 86 / 158 / 158 / 112 RPR: NR  Rhogam NA HBsAg: negative  TDaP vaccine 01/02/18 Flu Shot: UTD HIV: negative  Baby Food                                GBS:  Contraception  Pap: 08/18/17 ASCUS HPV negative  CBB     CS/VBAC    Support Person               Hx of preeclampsia, prior pregnancy, currently pregnant 09/12/2017 by Vena AustriaStaebler, Andreas, MD No   Overview Signed 09/12/2017  8:36 AM by Vena AustriaStaebler, Andreas, MD    Start low dose ASA at >[redacted] weeks gestation as per USPTF recommendation "Low-Dose Aspirin Use for the Prevention of Morbidity and Mortality From Preeclampsia: Preventive Medicine"  furthermore endorsed  by ACOG, WHO, and NIH based on evidence level B for the prevention of preeclampsia  In women deemed high risk  (diabetes, renal disease, chronic hypertension, history of preeclampsia in prior gestation, autoimmune diseases, or multifetal gestations).  ACOG Committee Opinion 743 "Low-Dose Asprin Use During Pregnancy" June 25th 2018  High Risk (Start if 1 or more present) History of preeclampsia Multifetal Gestation Chronic HTN Type I or II DM Renal Disease Autoimmune Disease (SLE, Antiphospholipid antibody  syndrome)  Moderate Risk (consider starting if more than one present) Nulliparity Obesity (BMI >30) Family history of Preeclampsia (Mother or sister) Socioeconomic characteristics (African American, low socieeconomic status) Age 49 years or older Personal history factors (low birthweight of SGA, previous adverse pregnancy outcome, more than 10 year pregnancy interval)        Fetal kick count instructions given. Endorses good fetal movement.  Preterm labor symptoms and general obstetric precautions including but not limited to vaginal bleeding, contractions, leaking of fluid and fetal movement were reviewed in detail with the patient.  Return in about 2 weeks (around 01/30/2018) for ROB.  [name], CNM 01/16/2018  8:27 AM

## 2018-01-16 NOTE — Progress Notes (Signed)
No concerns.rj 

## 2018-01-26 DIAGNOSIS — R002 Palpitations: Secondary | ICD-10-CM | POA: Insufficient documentation

## 2018-01-26 NOTE — Progress Notes (Signed)
Cardiology Office Note  Date:  01/28/2018   ID:  LAKYLA BISWAS, DOB 10-Jul-1981, MRN [id number]  PCP:  Patient, No Pcp Per   Chief Complaint  Patient presents with  . OTHER    PVC. Meds reviewed verbally with pt.    HPI:  37 year old woman with past medical history of [redacted] weeks gestation, due date 3/16 Referred by Dr. Tiburcio Pea for consultation of her palpitations, PVCs  Work stress, running aound Seen in the emergency room November 20, 2017 for palpitations Hospital records reviewed with the patient in detail  heart was beating irregularly and skipping beats.   lasted about 20 minutes.    got faint.    ate a peanut butter and jelly sandwich.     She denies any chest pain, nausea, vomiting, abdominal pain.     She said it felt like a Braxton Hicks contraction.    G2 P1.    no other episodes since this occurred The patient states that she had some diet Pepsi  Lots of stress at work, wonders if she could have been dehydrated  Since November 2018 she has had no further episodes of palpitations Feels well, still working nurse on orthopedics  EKG November 26 personally reviewed by myself showing normal sinus rhythm no arrhythmia  EKG February 2017 personally reviewed by myself showing normal sinus rhythm no arrhythmia  EKG personally reviewed by myself on todays visit Shows normal sinus rhythm with rate 73 bpm no significant ST or T wave changes   PMH:   has a past medical history of Asthma, Breast mass (x 1  month), Complication of anesthesia, GERD (gastroesophageal reflux disease), Heart murmur, 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  PSH:    Past Surgical History:  Procedure Laterality Date  . CYST EXCISION  2010   head cysts x 14.  high levels of keratin  . LESION EXCISION N/A 02/16/2016   Procedure: EXCISION SCALP LESION,excision 2 large scalp cysrts with intermediate closure;  Surgeon: Kieth Brightly, MD;  Location:  [facility] ORS;  Service: General;  Laterality: N/A;  . WISDOM TOOTH EXTRACTION      Current Outpatient Medications  Medication Sig Dispense Refill  . albuterol (PROVENTIL HFA;VENTOLIN HFA) 108 (90 BASE) MCG/ACT inhaler Inhale 2 puffs into the lungs every 4 (four) hours as needed for wheezing or shortness of breath.    Marland Kitchen aspirin 81 MG chewable tablet Chew by mouth daily.    . calcium carbonate (TUMS - DOSED IN MG ELEMENTAL CALCIUM) 500 MG chewable tablet Chew 1 tablet by mouth as needed for indigestion or heartburn.    . Prenatal Vit-Fe Fumarate-FA (PRENATAL MULTIVITAMIN) TABS tablet Take 1 tablet by mouth daily at 12 noon.    . ranitidine (ZANTAC) 150 MG tablet Take 150 mg by mouth 2 (two) times daily.     No current facility-administered medications for this visit.      Allergies:   Sulfa antibiotics; Levaquin [levofloxacin in d5w]; and Penicillins   Social History:  The patient  reports that  has never smoked. she has never used smokeless tobacco. She reports that she does not drink alcohol or use drugs.   Family History:   family history includes Asthma in her mother; Breast cancer in her other; Diabetes in her maternal grandmother and mother; Factor VIII deficiency in her other and other; Heart attack in her father; Heart disease in her father; Hypertension in her mother; Obesity in her mother.    Review of Systems:  Review of Systems  Constitutional: Negative.   Respiratory: Negative.   Cardiovascular: Positive for palpitations.  Gastrointestinal: Negative.   Musculoskeletal: Negative.   Neurological: Negative.   Psychiatric/Behavioral: Negative.   All other systems reviewed and are negative.    PHYSICAL EXAM: VS:  BP 130/90 (BP Location: Right Arm, Patient Position: Sitting, Cuff Size: Normal)   Pulse 73   Ht 5\' 3"  (1.6 m)   Wt 259 lb 4 oz (117.6 kg)   LMP 05/09/2017   BMI 45.92 kg/m  , BMI Body mass index is 45.92 kg/m. GEN: Well nourished, well developed, in no acute  distress  HEENT: normal  Neck: no JVD, carotid bruits, or masses Cardiac: RRR; no murmurs, rubs, or gallops,no edema  Respiratory:  clear to auscultation bilaterally, normal work of breathing GI: soft, nontender, distended, + BS MS: no deformity or atrophy  Skin: warm and dry, no rash Neuro:  Strength and sensation are intact Psych: euthymic mood, full affect    Recent Labs: 11/19/2017: ALT 16; BUN 6; Creatinine, Ser 0.44; Potassium 3.9; Sodium 137 11/20/2017: Magnesium 1.8; TSH 2.196 12/19/2017: Hemoglobin 10.8; Platelets 280    Lipid Panel No results found for: CHOL, HDL, LDLCALC, TRIG    Wt Readings from Last 3 Encounters:  01/28/18 259 lb 4 oz (117.6 kg)  01/16/18 253 lb (114.8 kg)  01/02/18 250 lb (113.4 kg)       ASSESSMENT AND PLAN:  Palpitations - Plan: EKG 12-Lead Likely having APCs or PVCs Last episode November 2018 No episode since that time Discussed options if she has recurrent symptoms that are significant We could order Holter monitor If she is able to tolerate the ectopy, would likely not treat with medications to avoid beta-blockers Certainly if she has severe symptoms these could be treated Abnormal EKG past several including today, normal exam No indication for echocardiogram  Elderly multigravida in third trimester Week 34, tolerating pregnancy well She is monitoring blood pressure at home Recommended she record numbers at home outside the doctor's office  Disposition:   F/U as needed   Total encounter time more than 45 minutes  Greater than 50% was spent in counseling and coordination of care with the patient  Patient was seen in consultation for Dr. Tiburcio PeaHarris will be referred back to his office for ongoing care of the issues detailed above   Orders Placed This Encounter  Procedures  . EKG 12-Lead     Signed, Dossie Arbourim Sabina Beavers, M.D., Ph.D. 01/28/2018  [facility] [place] [phone]

## 2018-01-28 ENCOUNTER — Encounter: Payer: Self-pay | Admitting: Cardiovascular Disease

## 2018-01-28 ENCOUNTER — Ambulatory Visit: Payer: 59 | Admitting: Cardiovascular Disease

## 2018-01-28 VITALS — BP 130/90 | HR 73 | Ht 63.0 in | Wt 259.2 lb

## 2018-01-28 DIAGNOSIS — R002 Palpitations: Secondary | ICD-10-CM | POA: Diagnosis not present

## 2018-01-28 DIAGNOSIS — Z331 Pregnant state, incidental: Secondary | ICD-10-CM

## 2018-01-28 DIAGNOSIS — O09523 Supervision of elderly multigravida, third trimester: Secondary | ICD-10-CM

## 2018-01-28 NOTE — Patient Instructions (Addendum)
Medication Instructions:   No medication changes made  Labwork:  No new labs needed  Testing/Procedures:  No further testing at this time   Follow-Up: It was a pleasure seeing you in the office today. Please call us if you have new issues that need to be addressed before your next appt.  [phone]  Your physician wants you to follow-up in:  As needed  If you need a refill on your cardiac medications before your next appointment, please call your pharmacy.     

## 2018-01-30 ENCOUNTER — Ambulatory Visit (INDEPENDENT_AMBULATORY_CARE_PROVIDER_SITE_OTHER): Payer: 59 | Admitting: Maternal Newborn

## 2018-01-30 ENCOUNTER — Encounter: Payer: Self-pay | Admitting: Maternal Newborn

## 2018-01-30 VITALS — BP 130/80 | Wt 258.0 lb

## 2018-01-30 DIAGNOSIS — Z3A34 34 weeks gestation of pregnancy: Secondary | ICD-10-CM

## 2018-01-30 DIAGNOSIS — O09529 Supervision of elderly multigravida, unspecified trimester: Secondary | ICD-10-CM

## 2018-01-30 NOTE — Progress Notes (Signed)
C/o [email or web address] 

## 2018-01-30 NOTE — Progress Notes (Signed)
Routine Prenatal Care Visit  Subjective  Kimberly Mcfarland is a 37 y.o. G2P1001 at [redacted]w[redacted]d being seen today for ongoing prenatal care.  She is currently monitored for the following issues for this high-risk pregnancy and has Advanced maternal age in multigravida; Supervision of high-risk pregnancy of elderly multigravida (>= 6 years old at time of delivery); Hx of preeclampsia, prior pregnancy, currently pregnant; and Palpitations on their problem list.  ----------------------------------------------------------------------------------- Patient reports fatigue. Working night shift and hard to get enough rest. Contractions: Not present. Vag. Bleeding: None.  Movement: Present. Denies leaking of fluid.  ----------------------------------------------------------------------------------- The following portions of the patient's history were reviewed and updated as appropriate: allergies, current medications, past family history, past medical history, past social history, past surgical history and problem list. Problem list updated.   Objective  Blood pressure 130/80, weight 258 lb (117 kg), last menstrual period 05/09/2017. Pregravid weight 245 lb (111.1 kg) Total Weight Gain 13 lb (5.897 kg) Urinalysis: Urine Protein: Trace Urine Glucose: Negative  Fetal Status: Fetal Heart Rate (bpm): 130 Fundal Height: 35 cm Movement: Present     General:  Alert, oriented and cooperative. Patient is in no acute distress.  Skin: Skin is warm and dry. No rash noted.   Cardiovascular: Normal heart rate noted  Respiratory: Normal respiratory effort, no problems with respiration noted  Abdomen: Soft, gravid, appropriate for gestational age. Pain/Pressure: Absent     Pelvic:  Cervical exam deferred        Extremities: Normal range of motion.  Edema: Trace  Mental Status: Normal mood and affect. Normal behavior. Normal judgment and thought content.     Assessment   37 y.o. G2P1001 at [redacted]w[redacted]d, EDD3/16/2019 by  Ultrasound presenting for routine prenatal visit.  Plan   Pregnancy #2 Problems (from 05/09/17 to present)    Problem Noted Resolved   Advanced maternal age in multigravida 09/12/2017 by Kimberly Austria, MD No   Overview Addendum 09/25/2017  1:04 PM by Kimberly Austria, MD    Accepts InformaSeq drawn 09/12/17 normal XY      Supervision of high-risk pregnancy of elderly multigravida (>= 57 years old at time of delivery) 09/12/2017 by Kimberly Austria, MD No   Overview Addendum 01/03/2018  5:04 PM by Kimberly Austria, MD    [facility] Prenatal Labs  Dating 10 week [place] Blood type: A pos  Genetic Screen Informaseq normal XY Antibody:negative  Anatomic [place] Remains incomplete for face and RVOT at 24 week follow up [X]  DP referral normal heart views Rubella: Immune   Varicella: Immune  GTT Early:  Opted for 3-hr with 4/4 values normal             Third trimester: 174 3-hr 86 / 158 / 158 / 112 RPR: NR  Rhogam NA HBsAg: negative  TDaP vaccine 01/02/18 Flu Shot: UTD HIV: negative  Baby Food                                GBS:  Contraception  Pap: 08/18/17 ASCUS HPV negative  CBB     CS/VBAC    Support Person               Hx of preeclampsia, prior pregnancy, currently pregnant 09/12/2017 by Kimberly Austria, MD No   Overview Signed 09/12/2017  8:36 AM by Kimberly Austria, MD    Start low dose ASA at >[redacted] weeks gestation as per USPTF recommendation "Low-Dose Aspirin Use for  the Prevention of Morbidity and Mortality From Preeclampsia: Preventive Medicine"  furthermore endorsed by ACOG, WHO, and NIH based on evidence level B for the prevention of preeclampsia  In women deemed high risk  (diabetes, renal disease, chronic hypertension, history of preeclampsia in prior gestation, autoimmune diseases, or multifetal gestations).  ACOG Committee Opinion 743 "Low-Dose Asprin Use During Pregnancy" June 25th 2018  High Risk (Start if 1 or more present) History of preeclampsia Multifetal  Gestation Chronic HTN Type I or II DM Renal Disease Autoimmune Disease (SLE, Antiphospholipid antibody syndrome)  Moderate Risk (consider starting if more than one present) Nulliparity Obesity (BMI >30) Family history of Preeclampsia (Mother or sister) Socioeconomic characteristics (African American, low socieeconomic status) Age 75 years or older Personal history factors (low birthweight of SGA, previous adverse pregnancy outcome, more than 10 year pregnancy interval)          Preterm labor symptoms and general obstetric precautions including but not limited to vaginal bleeding, contractions, leaking of fluid and fetal movement were reviewed in detail with the patient.  Return in about 2 weeks (around 02/13/2018) for ROB.  Kimberly BruinsJacelyn Shakari Mcfarland, CNM 01/30/2018  8:50 AM

## 2018-02-05 ENCOUNTER — Other Ambulatory Visit: Payer: Self-pay

## 2018-02-05 ENCOUNTER — Observation Stay
Admission: EM | Admit: 2018-02-05 | Discharge: 2018-02-05 | Disposition: A | Discharge status: Home / Self Care | Payer: 59 | Attending: Certified Nurse Midwife | Admitting: Certified Nurse Midwife

## 2018-02-05 DIAGNOSIS — O09523 Supervision of elderly multigravida, third trimester: Secondary | ICD-10-CM

## 2018-02-05 DIAGNOSIS — Z825 Family history of asthma and other chronic lower respiratory diseases: Secondary | ICD-10-CM | POA: Diagnosis not present

## 2018-02-05 DIAGNOSIS — N6321 Unspecified lump in the left breast, upper outer quadrant: Secondary | ICD-10-CM | POA: Insufficient documentation

## 2018-02-05 DIAGNOSIS — J45909 Unspecified asthma, uncomplicated: Secondary | ICD-10-CM | POA: Diagnosis not present

## 2018-02-05 DIAGNOSIS — O163 Unspecified maternal hypertension, third trimester: Secondary | ICD-10-CM | POA: Diagnosis present

## 2018-02-05 DIAGNOSIS — Z7982 Long term (current) use of aspirin: Secondary | ICD-10-CM | POA: Diagnosis not present

## 2018-02-05 DIAGNOSIS — Z9889 Other specified postprocedural states: Secondary | ICD-10-CM | POA: Insufficient documentation

## 2018-02-05 DIAGNOSIS — Z832 Family history of diseases of the blood and blood-forming organs and certain disorders involving the immune mechanism: Secondary | ICD-10-CM | POA: Insufficient documentation

## 2018-02-05 DIAGNOSIS — Z3A35 35 weeks gestation of pregnancy: Secondary | ICD-10-CM | POA: Insufficient documentation

## 2018-02-05 DIAGNOSIS — R51 Headache: Secondary | ICD-10-CM | POA: Insufficient documentation

## 2018-02-05 DIAGNOSIS — O26893 Other specified pregnancy related conditions, third trimester: Secondary | ICD-10-CM | POA: Diagnosis not present

## 2018-02-05 DIAGNOSIS — Z8249 Family history of ischemic heart disease and other diseases of the circulatory system: Secondary | ICD-10-CM | POA: Insufficient documentation

## 2018-02-05 DIAGNOSIS — Z1401 Asymptomatic hemophilia A carrier: Secondary | ICD-10-CM | POA: Insufficient documentation

## 2018-02-05 DIAGNOSIS — O9989 Other specified diseases and conditions complicating pregnancy, childbirth and the puerperium: Principal | ICD-10-CM | POA: Insufficient documentation

## 2018-02-05 DIAGNOSIS — Z803 Family history of malignant neoplasm of breast: Secondary | ICD-10-CM | POA: Diagnosis not present

## 2018-02-05 DIAGNOSIS — O99213 Obesity complicating pregnancy, third trimester: Secondary | ICD-10-CM | POA: Diagnosis not present

## 2018-02-05 DIAGNOSIS — R030 Elevated blood-pressure reading, without diagnosis of hypertension: Secondary | ICD-10-CM | POA: Insufficient documentation

## 2018-02-05 DIAGNOSIS — K219 Gastro-esophageal reflux disease without esophagitis: Secondary | ICD-10-CM | POA: Diagnosis not present

## 2018-02-05 DIAGNOSIS — O99513 Diseases of the respiratory system complicating pregnancy, third trimester: Secondary | ICD-10-CM | POA: Insufficient documentation

## 2018-02-05 DIAGNOSIS — Z833 Family history of diabetes mellitus: Secondary | ICD-10-CM | POA: Insufficient documentation

## 2018-02-05 DIAGNOSIS — Z79899 Other long term (current) drug therapy: Secondary | ICD-10-CM | POA: Insufficient documentation

## 2018-02-05 DIAGNOSIS — O09529 Supervision of elderly multigravida, unspecified trimester: Secondary | ICD-10-CM

## 2018-02-05 DIAGNOSIS — O09299 Supervision of pregnancy with other poor reproductive or obstetric history, unspecified trimester: Secondary | ICD-10-CM

## 2018-02-05 LAB — PROTEIN / CREATININE RATIO, URINE
Creatinine, Urine: 224 mg/dL
PROTEIN CREATININE RATIO: 0.11 mg/mg{creat} (ref 0.00–0.15)
TOTAL PROTEIN, URINE: 25 mg/dL

## 2018-02-05 LAB — COMPREHENSIVE METABOLIC PANEL
ALT: 10 U/L — ABNORMAL LOW (ref 14–54)
ANION GAP: 8 (ref 5–15)
AST: 20 U/L (ref 15–41)
Albumin: 2.5 g/dL — ABNORMAL LOW (ref 3.5–5.0)
Alkaline Phosphatase: 98 U/L (ref 38–126)
BUN: 11 mg/dL (ref 6–20)
CO2: 21 mmol/L — AB (ref 22–32)
Calcium: 8.6 mg/dL — ABNORMAL LOW (ref 8.9–10.3)
Chloride: 107 mmol/L (ref 101–111)
Creatinine, Ser: 0.6 mg/dL (ref 0.44–1.00)
GFR calc non Af Amer: >60 mL/min (ref >60)
Glucose, Bld: 129 mg/dL — ABNORMAL HIGH (ref 65–99)
POTASSIUM: 3.4 mmol/L — AB (ref 3.5–5.1)
SODIUM: 136 mmol/L (ref 135–145)
Total Bilirubin: 0.4 mg/dL (ref 0.3–1.2)
Total Protein: 6.5 g/dL (ref 6.5–8.1)

## 2018-02-05 LAB — CBC
HCT: 30.3 % — ABNORMAL LOW (ref 35.0–47.0)
Hemoglobin: 10.2 g/dL — ABNORMAL LOW (ref 12.0–16.0)
MCH: 29.5 pg (ref 26.0–34.0)
MCHC: 33.7 g/dL (ref 32.0–36.0)
MCV: 87.4 fL (ref 80.0–100.0)
PLATELETS: 269 10*3/uL (ref 150–440)
RBC: 3.46 MIL/uL — AB (ref 3.80–5.20)
RDW: 13.9 % (ref 11.5–14.5)
WBC: 13.4 10*3/uL — ABNORMAL HIGH (ref 3.6–11.0)

## 2018-02-05 LAB — TYPE AND SCREEN
ABO/RH(D): A POS
Antibody Screen: NEGATIVE

## 2018-02-05 IMAGING — US US MFM OB DETAIL+14 WK
1 of 2 series · 12 of 28 positions shown · non-contrast
Comparison: none

PATIENT INFO:

PERFORMED BY:
SERVICE(S) PROVIDED:
INDICATIONS:
26 weeks gestation of pregnancy
AMA
FETAL EVALUATION:
Num Of Fetuses:     1
Fetal Heart         128
Rate(bpm):
Fetal Lie:          Maternal Right
Presentation:       Cephalic
Placenta:           Posterior
P. Cord Insertion:  Normal
AFI Sum(cm)     %Tile       Largest Pocket(cm)
14.3            47
RUQ(cm)       RLQ(cm)       LUQ(cm)        LLQ(cm)
4.48
BIOMETRY:
BPD:      63.3  mm     G. Age:  25w 4d         10  %    CI:        68.15   %    70 - 86
FL/HC:      20.1   %    18.6 -
HC:      245.2  mm     G. Age:  26w 4d         22  %    HC/AC:      1.04        1.05 -
AC:      236.7  mm     G. Age:  28w 0d         78  %    FL/BPD:     77.7   %    71 - 87
FL:       49.2  mm     G. Age:  26w 4d         31  %    FL/AC:      20.8   %    20 - 24
HUM:      44.9  mm     G. Age:  26w 4d         44  %
Est. FW:    4308  gm      2 lb 5 oz     57  %
GESTATIONAL AGE:
U/S Today:     26w 5d                                        EDD:   03/09/18
Best:          26w 5d     Det. By:  Early Ultrasound         EDD:   03/09/18
ANATOMY:
Cavum:                 Not visualized         Aortic Arch:            Normal appearance
Ventricles:            Normal appearance      Ductal Arch:            Normal appearance
Choroid Plexus:        Within Normal Limits   Diaphragm:              Within Normal Limits
Cerebellum:            Within Normal Limits   Stomach:                Seen
Posterior Fossa:       Not visualized         Abdomen:                Within Normal
Limits
Nuchal Fold:           Beyond 22 weeks        Abdominal Wall:         Normal appearance
gestation
Face:                  Suboptimal             Cord Vessels:           3 vessels
Lips:                  Normal appearance      Kidneys:                Normal appearance
Thoracic:              Within Normal Limits   Bladder:                Seen
Heart:                 4-Chamber view         Spine:                  Normal appearance
appears normal
RVOT:                  Normal appearance      Upper Extremities:      Visualized
LVOT:                  Normal appearance      Lower Extremities:      Visualized

[Series 1: us mfm ob detail+14 wk · 0.30mm/px · 12 of 91 slices shown]
[im 4/91]
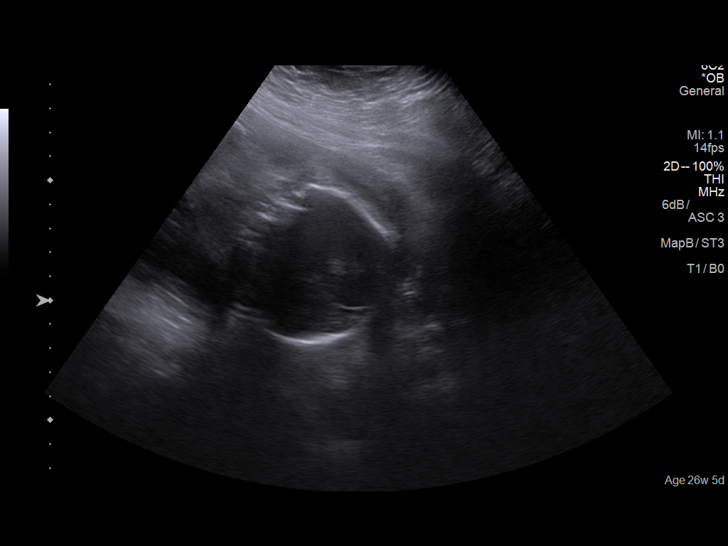
[im 11/91]
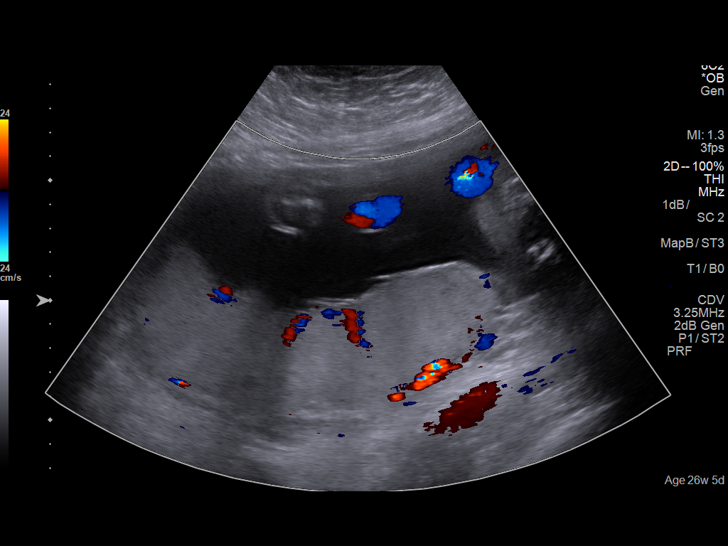
[im 18/91]
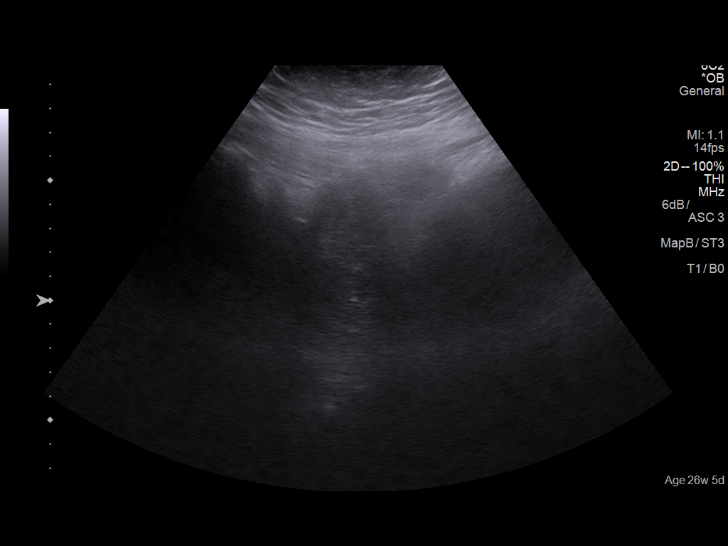
[im 28/91]
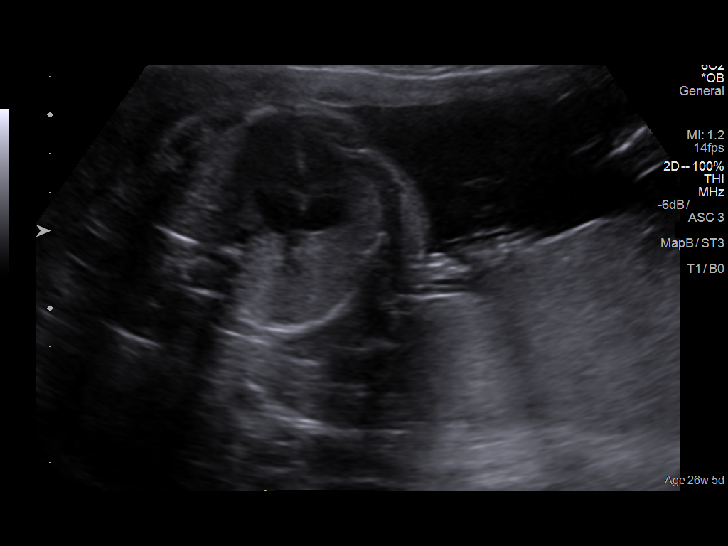
[im 35/91]
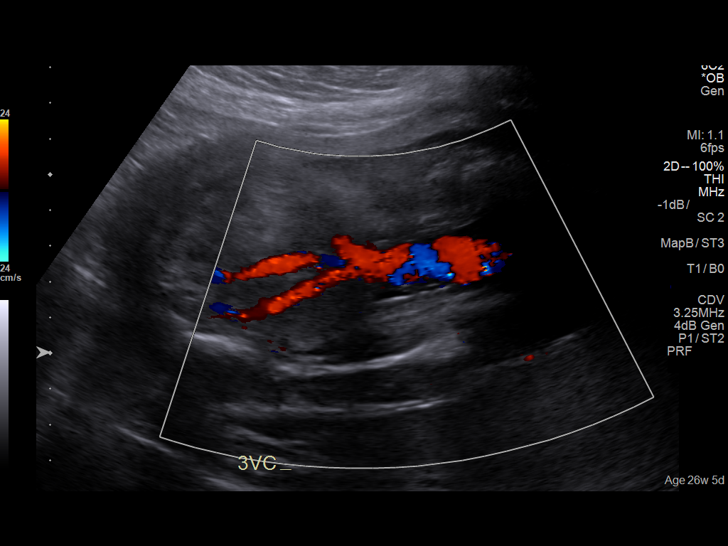
[im 42/91]
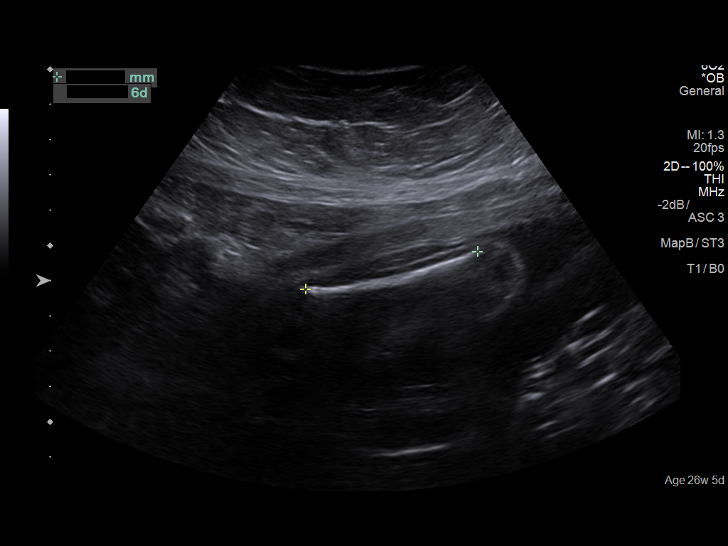
[im 52/91]
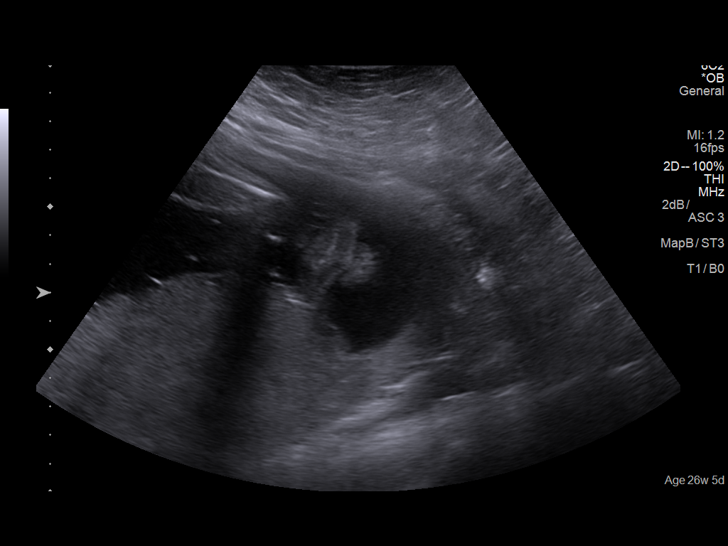
[im 59/91]
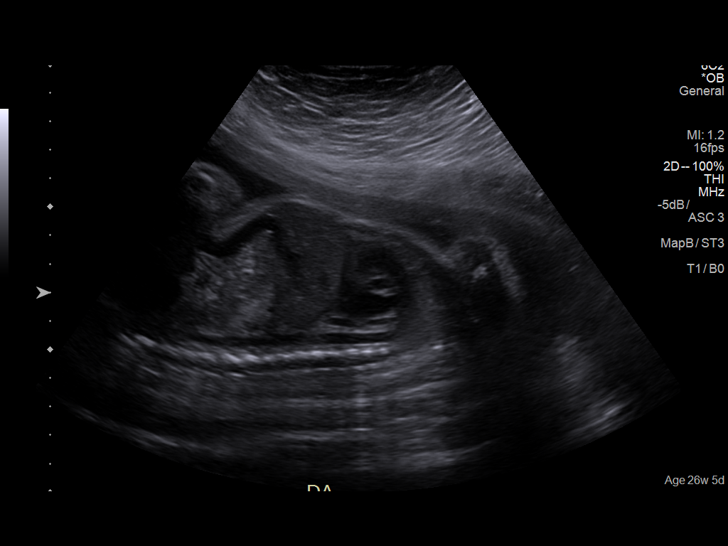
[im 66/91]
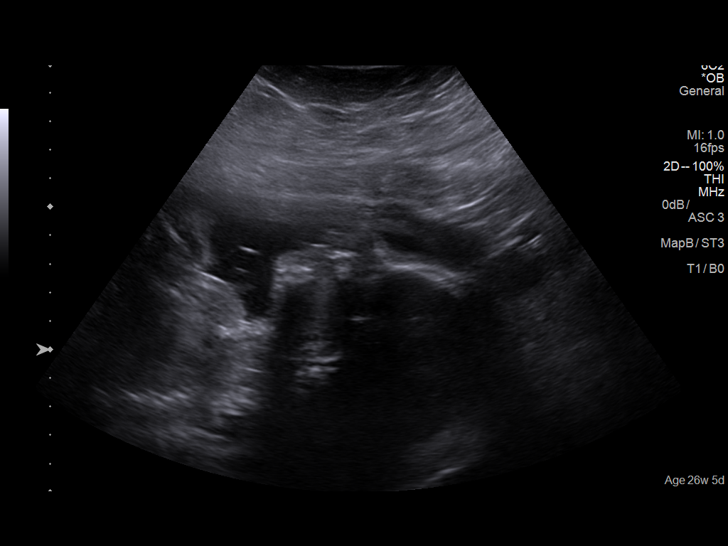
[im 77/91]
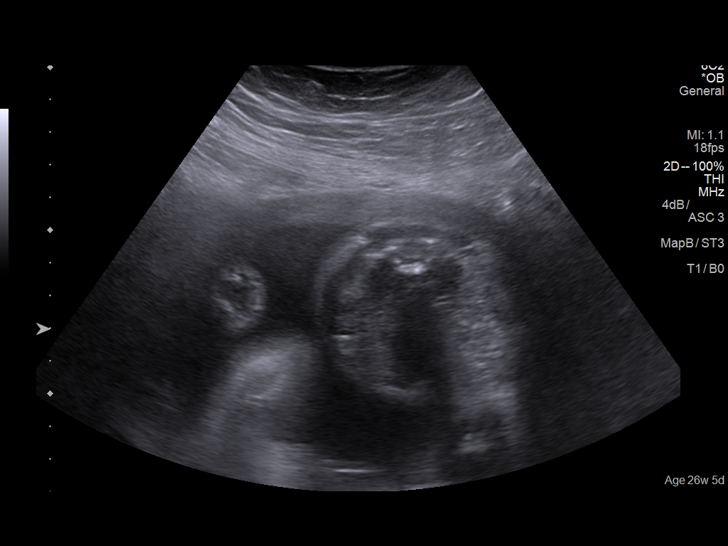
[im 84/91]
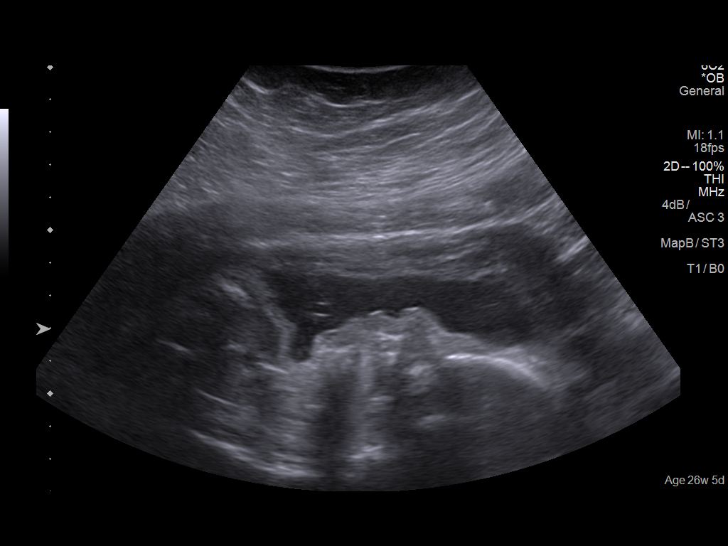
[im 91/91]
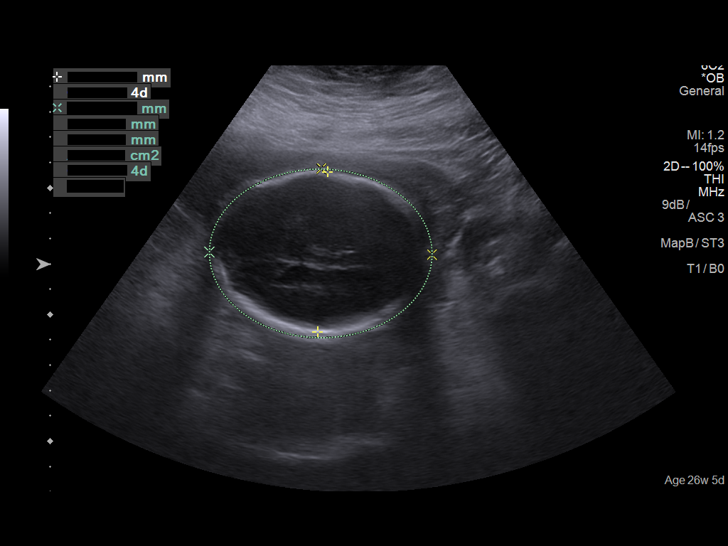

[12 of 28 positions shown; findings below may reference images not displayed]

IMPRESSION: Dear Dr.  Choichoi ,

Thank you for referring your patient for a fetal anatomical
survey- due to advancing maternal age 36 - normal
informaseq.

There is a singleton gestation with subjectively normal
amniotic fluid volume.

The fetal biometry correlates with established dating.Earliest
scan done at [facility] on 08/21/17 at 10w 4d giving
an EDC of 03/09/18

Detailed evaluation of the fetal anatomy was performed  The
Cavum septum pellucidum and posterior fossa could not be
seen , other  fetal anatomical survey appears within normal
limits within the resolution of ultrasound as described above.
All anatomical structures could not be adequately visualized
due to advancing gestational age and maternal habitus .

Thank you for allowing us to participate in your patient's care.

assistance.

## 2018-02-05 NOTE — OB Triage Note (Signed)
Pt arrived to triage with c/o elevated BP and slight HA.  Pain is 3/10.  Pt denies vaginal bleeding, LOF, and contractions. Pt states she is feeling baby move normally. EFM and toco applied and assessing.

## 2018-02-05 NOTE — Final Progress Note (Signed)
Physician Final Progress Note  Patient ID: Kimberly Mcfarland MRN: [id number] DOB/AGE: March 21, 1981 37 y.o.  Admit date: 02/05/2018 Admitting provider: [name], MD Discharge date: 02/05/2018   Admission Diagnoses: Elevated blood pressure and headache in pregnancy at 35weeks3d  Discharge Diagnoses:  Active Problems:   Elevated blood pressure affecting pregnancy in third trimester, antepartum  IUP at 35wk3d  Consults: None  Significant Findings/ Diagnostic Studies: 37 year old G2 P1001 with EDC=03/09/2018 by a 10wk4d ultrasound presents to the L&D at 35wk3d with complaints of headache and elevated blood pressure. 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 Took her blood pressure at work and it was elevated with systolic in the 160s. She denies visual changes, CP, SOB, N/V. ROS positive for reflux. Has a history of preeclampsia at term with her first pregnancy and has been taking ASA 81 mgm daily this pregnancy. Her prenatal care is also remarkable for AMA (had normal XY on Informaseq) and obesity (BMI>40). There is a family history of hemophilia (MGF and two nephews)-she has a 1/4 chance of her female fetus having hemophilia. She has also been evaluated this pregnancy for palpitations, but is on no medications.  Her pregnancy has also been remarkable for:  [facility] Prenatal Labs  Dating 10 week [place] Blood type: A pos  Genetic Screen Informaseq normal XY Antibody:negative  Anatomic [place] Remains incomplete for face and RVOT at 24 week follow up [X]  DP referral normal heart views Rubella: Immune Varicella: Immune  GTT Early: Opted for 3-hr with 4/4 values normal Third trimester: 174 3-hr 86 / 158 / 158 / 112 RPR: NR  Rhogam NA HBsAg: negative  TDaP vaccine 01/02/18 Flu Shot: UTD HIV: negative  Baby Food  GBS:  Contraception  Pap: 08/18/17 ASCUS HPV negative  CBB     CS/VBAC    Support Person               Past Medical History:   Diagnosis Date  . Asthma    has not used inhaler in years  . Breast mass x 1  month   about a 1 cm mass Left at 2 o'clock per pt  . Complication of anesthesia    difficulty voiding postop  . GERD (gastroesophageal reflux disease)   . Heart murmur    as child  . 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 Hypothyroid    h/o outgrew  . PCOS (polycystic ovarian syndrome)    Past Surgical History:  Procedure Laterality Date  . CYST EXCISION  2010   head cysts x 14.  high levels of keratin  . LESION EXCISION N/A 02/16/2016   Procedure: EXCISION SCALP LESION,excision 2 large scalp cysrts with intermediate closure;  Surgeon: Kieth Brightly, MD;  Location: [facility] ORS;  Service: General;  Laterality: N/A;  . WISDOM TOOTH EXTRACTION     Family History  Problem Relation Age of Onset  . Diabetes Mother   . Asthma Mother   . Obesity Mother   . Hypertension Mother   . Heart disease Father   . Heart attack Father   . Breast cancer Other   . Diabetes Maternal Grandmother        type 2  . Factor VIII deficiency Other        Also has factor 9 deficiency  . Factor VIII deficiency Other        also has factor 9 deficiency  . Hemophilia Maternal Grandfather    Social  History   Socioeconomic History  . Marital status: Married    Spouse name: Not on file  . Number of children: Not on file  . Years of education: Not on file  . Highest education level: Not on file  Social Needs  . Financial resource strain: Not on file  . Food insecurity - worry: Not on file  . Food insecurity - inability: Not on file  . Transportation needs - medical: Not on file  . Transportation needs - non-medical: Not on file  Occupational History  . Not on file  Tobacco Use  . Smoking status: Never Smoker  . Smokeless tobacco: Never Used  Substance and Sexual Activity  . Alcohol use: No    Alcohol/week: 0.0 oz    Frequency: Never    Comment: wine occasionally/not while pregnant  . Drug use: No  . Sexual  activity: Yes    Birth control/protection: Condom  Other Topics Concern  . Not on file  Social History Narrative  . Not on file   Exam:  Patient Vitals for the past 24 hrs:  BP Temp Temp src Pulse Resp Height Weight  02/05/18 0520 129/80 - - 82 - - -  02/05/18 0505 135/81 - - 88 - - -  02/05/18 0451 129/79 - - 86 - - -  02/05/18 0436 135/84 - - 86 - - -  02/05/18 0421 (!) 151/85 - - 80 - - -  02/05/18 0405 125/87 - - 94 - - -  02/05/18 0351 120/73 - - (!) 101 - - -  02/05/18 0340 - - - - - 5\' 3"  (1.6 m) 117.5 kg (259 lb)  02/05/18 0334 (!) 139/100 98.5 F (36.9 C) Oral 99 16 - -   General: gravid WF in NAD Heart: RRR without murmur Lungs: CTA Abdomen: soft, NT, obese, gravid Extremities: no edema, DTRs +1 FHR: 145 baseline with accelerations to 160s to 180s, moderate variability Toco: occasional mild contraction  Neurological: Alert, awake, oriented x 3 Psyche: mood and affect normal.  Results for orders placed or performed during the hospital encounter of 02/05/18 (from the past 24 hour(s))  Protein / creatinine ratio, urine     Status: None   Collection Time: 02/05/18  3:37 AM  Result Value Ref Range   Creatinine, Urine 224 mg/dL   Total Protein, Urine 25 mg/dL   Protein Creatinine Ratio 0.11 0.00 - 0.15 mg/mg[Cre]  CBC     Status: Abnormal   Collection Time: 02/05/18  3:59 AM  Result Value Ref Range   WBC 13.4 (H) 3.6 - 11.0 K/uL   RBC 3.46 (L) 3.80 - 5.20 MIL/uL   Hemoglobin 10.2 (L) 12.0 - 16.0 g/dL   HCT 47.830.3 (L) 29.535.0 - 62.147.0 %   MCV 87.4 80.0 - 100.0 fL   MCH 29.5 26.0 - 34.0 pg   MCHC 33.7 32.0 - 36.0 g/dL   RDW 30.813.9 65.711.5 - 84.614.5 %   Platelets 269 150 - 440 K/uL  Comprehensive metabolic panel     Status: Abnormal   Collection Time: 02/05/18  3:59 AM  Result Value Ref Range   Sodium 136 135 - 145 mmol/L   Potassium 3.4 (L) 3.5 - 5.1 mmol/L   Chloride 107 101 - 111 mmol/L   CO2 21 (L) 22 - 32 mmol/L   Glucose, Bld 129 (H) 65 - 99 mg/dL   BUN 11 6 - 20  mg/dL   Creatinine, Ser 9.620.60 0.44 - 1.00 mg/dL   Calcium 8.6 (  L) 8.9 - 10.3 mg/dL   Total Protein 6.5 6.5 - 8.1 g/dL   Albumin 2.5 (L) 3.5 - 5.0 g/dL   AST 20 15 - 41 U/L   ALT 10 (L) 14 - 54 U/L   Alkaline Phosphatase 98 38 - 126 U/L   Total Bilirubin 0.4 0.3 - 1.2 mg/dL   GFR calc non Af Amer >60 >60 mL/min   GFR calc Af Amer >60 >60 mL/min   Anion gap 8 5 - 15  Type and screen     Status: None   Collection Time: 02/05/18  3:59 AM  Result Value Ref Range   ABO/RH(D) A POS    Antibody Screen NEG    Sample Expiration      02/08/2018 Performed at [facility], [address] [place], [place] [address]    A: Elevated blood pressures in pregnancy No evidence of preeclampsia Cat 1 tracing Headache-now resolved  P: DC home with preeclampsia precautions Follow up at office tomorrow for blood pressure check  Procedures: none  Discharge Condition: stable  Disposition: 01-Home or Self Care  Diet: Regular diet  Discharge Activity: Activity as tolerated. Not scheduled to work until Friday 2/15  Discharge Instructions    Discharge patient   Complete by:  As directed    Discharge disposition:  01-Home or Self Care   Discharge patient date:  02/05/2018     Allergies as of 02/05/2018      Reactions   Sulfa Antibiotics Other (See Comments)   seizures   Levaquin [levofloxacin In D5w] Hives   Penicillins Hives      Medication List    TAKE these medications   albuterol 108 (90 Base) MCG/ACT inhaler Commonly known as:  PROVENTIL HFA;VENTOLIN HFA Inhale 2 puffs into the lungs every 4 (four) hours as needed for wheezing or shortness of breath.   aspirin 81 MG chewable tablet Chew by mouth daily.   calcium carbonate 500 MG chewable tablet Commonly known as:  TUMS - dosed in mg elemental calcium Chew 1 tablet by mouth as needed for indigestion or heartburn.   prenatal multivitamin Tabs tablet Take 1 tablet by mouth daily at 12 noon.   ranitidine 150 MG  tablet Commonly known as:  ZANTAC Take 150 mg by mouth 2 (two) times daily.      Follow-up Information    [facility]. Go to.   Specialty:  Obstetrics and Gynecology Why:  Keep regularly scheduled appointments.  Make follow up appt in one day Contact information: [address] [place] [address] [phone]          Total time spent taking care of this patient: 20 minutes  Signed: Farrel Conners 02/05/2018, 7:23 AM

## 2018-02-05 NOTE — Discharge Instructions (Signed)
Hypertension During Pregnancy Hypertension is also called high blood pressure. High blood pressure means that the force of your blood moving in your body is too strong. When you are pregnant, this condition should be watched carefully. It can cause problems for you and your baby. Follow these instructions at home: Eating and drinking  Drink enough fluid to keep your pee (urine) clear or pale yellow.  Eat healthy foods that are low in salt (sodium). ? Do not add salt to your food. ? Check labels on foods and drinks to see much salt is in them. Look on the label where you see "Sodium." Lifestyle  Do not use any products that contain nicotine or tobacco, such as cigarettes and e-cigarettes. If you need help quitting, ask your doctor.  Do not use alcohol.  Avoid caffeine.  Avoid stress. Rest and get plenty of sleep. General instructions  Take over-the-counter and prescription medicines only as told by your doctor.  While lying down, lie on your left side. This keeps pressure off your baby.  While sitting or lying down, raise (elevate) your feet. Try putting some pillows under your lower legs.  Keep all prenatal and follow-up visits as told by your doctor. This is important. Contact a doctor if:  You have symptoms that your doctor told you to watch for, such as: ? Fever. ? Throwing up (vomiting). ? Headache. Get help right away if:  You have very bad pain in your belly (abdomen).  You are throwing up, and this does not get better with treatment.  You suddenly get swelling in your hands, ankles, or face.  You gain 4 lb (1.8 kg) or more in 1 week.  You get bleeding from your vagina.  You have blood in your pee.  You do not feel your baby moving as much as normal.  You have a change in vision.  You have muscle twitching or sudden tightening (spasms).  You have trouble breathing.  Your lips or fingernails turn blue. This information is not intended to replace advice  given to you by your health care provider. Make sure you discuss any questions you have with your health care provider. Document Released: 01/13/2011 Document Revised: 08/22/2016 Document Reviewed: 08/22/2016 Elsevier Interactive Patient Education  [employer]2018 Elsevier Inc.

## 2018-02-05 NOTE — OB Triage Note (Signed)
Discharge instructions provided and reviewed.  Follow up discussed.  All concerns addressed.

## 2018-02-06 ENCOUNTER — Ambulatory Visit (INDEPENDENT_AMBULATORY_CARE_PROVIDER_SITE_OTHER): Payer: 59 | Admitting: Certified Nurse Midwife

## 2018-02-06 VITALS — BP 132/82 | Wt 256.0 lb

## 2018-02-06 DIAGNOSIS — Z3A35 35 weeks gestation of pregnancy: Secondary | ICD-10-CM

## 2018-02-06 DIAGNOSIS — O09529 Supervision of elderly multigravida, unspecified trimester: Secondary | ICD-10-CM

## 2018-02-06 DIAGNOSIS — O163 Unspecified maternal hypertension, third trimester: Secondary | ICD-10-CM

## 2018-02-06 LAB — RPR: RPR: NONREACTIVE

## 2018-02-06 NOTE — Progress Notes (Signed)
HROB at 35wk4d with BMI>40, history of preeclampsia with first baby and family history of hemophilia.  Seen in L&D in the early morning yesterday after having an elevated blood pressure at work accompanied by a mild headache.  Had a couple mild range blood pressures with normal labs. Headache resolved with being monitored Presents for a follow up blood pressure: 132/82 today. Has lost 2 pounds over the last week. Negative proteinuria Denies headache. Baby active. Cephalic (ROA) on ultrasound today. FHTs WNL. RTO 1 week for NST and AFI and GBS Preeclampsia precautions  Kimberly Mcfarland, CNM

## 2018-02-06 NOTE — Progress Notes (Signed)
F/u BP check from 02/04/18. Pt reports no problems.

## 2018-02-11 ENCOUNTER — Other Ambulatory Visit: Payer: Self-pay | Admitting: Maternal Newborn

## 2018-02-11 ENCOUNTER — Other Ambulatory Visit: Payer: Self-pay | Admitting: Certified Nurse Midwife

## 2018-02-11 DIAGNOSIS — O09523 Supervision of elderly multigravida, third trimester: Secondary | ICD-10-CM

## 2018-02-12 ENCOUNTER — Ambulatory Visit (INDEPENDENT_AMBULATORY_CARE_PROVIDER_SITE_OTHER): Payer: 59 | Admitting: Obstetrics and Gynecology

## 2018-02-12 VITALS — BP 138/92 | Wt 258.0 lb

## 2018-02-12 DIAGNOSIS — O09529 Supervision of elderly multigravida, unspecified trimester: Secondary | ICD-10-CM | POA: Diagnosis not present

## 2018-02-12 DIAGNOSIS — Z880 Allergy status to penicillin: Secondary | ICD-10-CM

## 2018-02-12 DIAGNOSIS — Z3A14 14 weeks gestation of pregnancy: Secondary | ICD-10-CM | POA: Diagnosis not present

## 2018-02-12 DIAGNOSIS — Z3A36 36 weeks gestation of pregnancy: Secondary | ICD-10-CM | POA: Diagnosis not present

## 2018-02-12 DIAGNOSIS — Z3685 Encounter for antenatal screening for Streptococcus B: Secondary | ICD-10-CM

## 2018-02-12 DIAGNOSIS — B3731 Acute candidiasis of vulva and vagina: Secondary | ICD-10-CM

## 2018-02-12 DIAGNOSIS — O163 Unspecified maternal hypertension, third trimester: Secondary | ICD-10-CM

## 2018-02-12 DIAGNOSIS — O09299 Supervision of pregnancy with other poor reproductive or obstetric history, unspecified trimester: Secondary | ICD-10-CM

## 2018-02-12 DIAGNOSIS — B373 Candidiasis of vulva and vagina: Secondary | ICD-10-CM

## 2018-02-12 LAB — OB RESULTS CONSOLE GBS: STREP GROUP B AG: NEGATIVE

## 2018-02-12 MED ORDER — FLUCONAZOLE 150 MG PO TABS: 150.0000 mg | ORAL_TABLET | Freq: Once | ORAL | 0 refills | Status: AC

## 2018-02-12 NOTE — Progress Notes (Signed)
Obstetric H&P   Chief Complaint: Leaking fluid  Prenatal Care Provider: [facility]  History of Present Illness: 37 y.o. G2P1001 [redacted]w[redacted]d by 03/09/2018, by Ultrasound presenting to clinic today with complaints of leaking fluid.  +FM, no VB, no ctx.  She denies headaches or vision changes, RUQ or epigastric pain.  She has been followed for elevated BP in the third trimester.   Pregravid weight 245 lb (111.1 kg) Total Weight Gain 13 lb (5.897 kg)  Pregnancy #2 Problems (from 05/09/17 to present)    Problem Noted Resolved   Advanced maternal age in multigravida 09/12/2017 by [name], MD No   Overview Addendum 09/25/2017  1:04 PM by [name], MD    Accepts InformaSeq drawn 09/12/17 normal XY      Supervision of high-risk pregnancy of elderly multigravida (>= 42 years old at time of delivery) 09/12/2017 by [name], MD No   Overview Addendum 01/03/2018  5:04 PM by [name], MD    [facility] Prenatal Labs  Dating 10 week [place] Blood type: A pos  Genetic Screen Informaseq normal XY Antibody:negative  Anatomic [place] Remains incomplete for face and RVOT at 24 week follow up [X]  DP referral normal heart views Rubella: Immune   Varicella: Immune  GTT Early:  Opted for 3-hr with 4/4 values normal             Third trimester: 174 3-hr 86 / 158 / 158 / 112 RPR: NR  Rhogam NA HBsAg: negative  TDaP vaccine 01/02/18 Flu Shot: UTD HIV: negative  Baby Food                                GBS:  Contraception  Pap: 08/18/17 ASCUS HPV negative  CBB     CS/VBAC    Support Person               Hx of preeclampsia, prior pregnancy, currently pregnant 09/12/2017 by [name], MD No   Overview Signed 09/12/2017  8:36 AM by [name], MD    Start low dose ASA at >[redacted] weeks gestation as per USPTF recommendation "Low-Dose Aspirin Use for the Prevention of Morbidity and Mortality From Preeclampsia: Preventive Medicine"  furthermore endorsed by ACOG, WHO, and NIH based on  evidence level B for the prevention of preeclampsia  In women deemed high risk  (diabetes, renal disease, chronic hypertension, history of preeclampsia in prior gestation, autoimmune diseases, or multifetal gestations).  ACOG Committee Opinion 743 "Low-Dose Asprin Use During Pregnancy" June 25th 2018  High Risk (Start if 1 or more present) History of preeclampsia Multifetal Gestation Chronic HTN Type I or II DM Renal Disease Autoimmune Disease (SLE, Antiphospholipid antibody syndrome)  Moderate Risk (consider starting if more than one present) Nulliparity Obesity (BMI >30) Family history of Preeclampsia (Mother or sister) Socioeconomic characteristics (African American, low socieeconomic status) Age 68 years or older Personal history factors (low birthweight of SGA, previous adverse pregnancy outcome, more than 10 year pregnancy interval)           Review of Systems: 10 point review of systems negative unless otherwise noted in HPI  Past Medical History: Past Medical History:  Diagnosis Date  . Asthma    has not used inhaler in years  . Breast mass x 1  month   about a 1 cm mass Left at 2 o'clock per pt  . Complication of anesthesia    difficulty voiding postop  .  GERD (gastroesophageal reflux disease)   . Heart murmur    as child  . 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 Hypothyroid    h/o outgrew  . PCOS (polycystic ovarian syndrome)     Past Surgical History: Past Surgical History:  Procedure Laterality Date  . CYST EXCISION  2010   head cysts x 14.  high levels of keratin  . LESION EXCISION N/A 02/16/2016   Procedure: EXCISION SCALP LESION,excision 2 large scalp cysrts with intermediate closure;  Surgeon: Kieth Brightly, MD;  Location: [facility] ORS;  Service: General;  Laterality: N/A;  . WISDOM TOOTH EXTRACTION      Past Obstetric History: #: 1, Date: 07/18/14, Sex: Female, Weight: 8 lb 6 oz (3.799 kg), GA: [redacted]w[redacted]d, Delivery: Vaginal, Spontaneous, Apgar1: 8,  Apgar5: 8, Living: Living, Birth Comments: None  #: 2, Date: None, Sex: None, Weight: None, GA: None, Delivery: None, Apgar1: None, Apgar5: None, Living: None, Birth Comments: None   Past Gynecologic History:  Family History: Family History  Problem Relation Age of Onset  . Diabetes Mother   . Asthma Mother   . Obesity Mother   . Hypertension Mother   . Heart disease Father   . Heart attack Father   . Breast cancer Other   . Diabetes Maternal Grandmother        type 2  . Factor VIII deficiency Other        Also has factor 9 deficiency  . Factor VIII deficiency Other        also has factor 9 deficiency  . Hemophilia Maternal Grandfather     Social History: Social History   Socioeconomic History  . Marital status: Married    Spouse name: Not on file  . Number of children: 1  . Years of education: Not on file  . Highest education level: Not on file  Social Needs  . Financial resource strain: Not on file  . Food insecurity - worry: Not on file  . Food insecurity - inability: Not on file  . Transportation needs - medical: Not on file  . Transportation needs - non-medical: Not on file  Occupational History  . Not on file  Tobacco Use  . Smoking status: Never Smoker  . Smokeless tobacco: Never Used  Substance and Sexual Activity  . Alcohol use: No    Alcohol/week: 0.0 oz    Frequency: Never    Comment: wine occasionally/not while pregnant  . Drug use: No  . Sexual activity: Yes    Birth control/protection: Condom  Other Topics Concern  . Not on file  Social History Narrative  . Not on file    Medications: Prior to Admission medications   Medication Sig Start Date End Date Taking? Authorizing Provider  albuterol (PROVENTIL HFA;VENTOLIN HFA) 108 (90 BASE) MCG/ACT inhaler Inhale 2 puffs into the lungs every 4 (four) hours as needed for wheezing or shortness of breath.    [provider]  aspirin 81 MG chewable tablet Chew by mouth daily.    [provider]  calcium carbonate (TUMS - DOSED IN MG ELEMENTAL CALCIUM) 500 MG chewable tablet Chew 1 tablet by mouth as needed for indigestion or heartburn.    [provider]  Ferrous Sulfate (IRON) 325 (65 Fe) MG TABS Take by mouth.    [provider]  Prenatal Vit-Fe Fumarate-FA (PRENATAL MULTIVITAMIN) TABS tablet Take 1 tablet by mouth daily at 12 noon.    [provider]  ranitidine (ZANTAC) 150 MG tablet Take  150 mg by mouth 2 (two) times daily.    [provider]    Allergies: Allergies  Allergen Reactions  . Sulfa Antibiotics Other (See Comments)    seizures  . Levaquin [Levofloxacin In D5w] Hives  . Penicillins Hives    Physical Exam: Vitals: Blood pressure (!) 138/92, weight 258 lb (117 kg), last menstrual period 05/09/2017.  Urine Dip Protein: neg  FHT:145  General: NAD HEENT: normocephalic, anicteric Pulmonary: No increased work of breathing Cardiovascular: RRR, distal pulses 2+ Abdomen: Gravid, non-tender Genitourinary: closed, soft, 50, high Extremities: no edema, erythema, or tenderness Neurologic: Grossly intact Psychiatric: mood appropriate, affect full  Wet Prep: PH: <4.5 Clue Cells: Negative Fungal elements: Positive Trichomonas: Negative   Labs: No results found for this or any previous visit (from the past 24 hour(s)).  Assessment: 37 y.o. G2P1001 6246w3d by 03/09/2018, by Ultrasound presenting for possible prelabor rupture of membranes  Plan: 1)  GHTN - repeat labs today, IOL at 37 week scheduled 02/17/18.  Repeat BP 148/98  2) Fetus - NST scheduled for tomorrow  3) PNL - see HPI - GBS collected  4) Immunization History -  Immunization History  Administered Date(s) Administered  . Tdap 01/02/2018    5) Disposition - NST tomorrow IOL 02/17/18  Vena AustriaAndreas Jen Benedict, MD, Merlinda FrederickFACOG [facility], [facility] 02/12/2018, 8:44 AM

## 2018-02-12 NOTE — Progress Notes (Signed)
ROB  GBS Leaking fluid/spotting. CTX Repeat B/P 148/98

## 2018-02-12 NOTE — Addendum Note (Signed)
Addended by: Lorrene ReidSTAEBLER, Breniyah Romm M on: 02/12/2018 09:38 AM   Modules accepted: Orders

## 2018-02-13 ENCOUNTER — Ambulatory Visit (INDEPENDENT_AMBULATORY_CARE_PROVIDER_SITE_OTHER): Payer: 59 | Admitting: Certified Nurse Midwife

## 2018-02-13 ENCOUNTER — Ambulatory Visit (INDEPENDENT_AMBULATORY_CARE_PROVIDER_SITE_OTHER): Payer: 59

## 2018-02-13 VITALS — BP 132/92 | Wt 258.0 lb

## 2018-02-13 DIAGNOSIS — O09523 Supervision of elderly multigravida, third trimester: Secondary | ICD-10-CM | POA: Diagnosis not present

## 2018-02-13 DIAGNOSIS — O09529 Supervision of elderly multigravida, unspecified trimester: Secondary | ICD-10-CM

## 2018-02-13 DIAGNOSIS — Z3A36 36 weeks gestation of pregnancy: Secondary | ICD-10-CM

## 2018-02-13 DIAGNOSIS — O99210 Obesity complicating pregnancy, unspecified trimester: Secondary | ICD-10-CM | POA: Diagnosis not present

## 2018-02-13 DIAGNOSIS — O1400 Mild to moderate pre-eclampsia, unspecified trimester: Secondary | ICD-10-CM

## 2018-02-13 DIAGNOSIS — Z832 Family history of diseases of the blood and blood-forming organs and certain disorders involving the immune mechanism: Secondary | ICD-10-CM | POA: Insufficient documentation

## 2018-02-13 LAB — CBC
HEMOGLOBIN: 10.8 g/dL — AB (ref 11.1–15.9)
Hematocrit: 32.3 % — ABNORMAL LOW (ref 34.0–46.6)
MCH: 29.4 pg (ref 26.6–33.0)
MCHC: 33.4 g/dL (ref 31.5–35.7)
MCV: 88 fL (ref 79–97)
PLATELETS: 252 10*3/uL (ref 150–379)
RBC: 3.67 x10E6/uL — AB (ref 3.77–5.28)
RDW: 14.3 % (ref 12.3–15.4)
WBC: 13.9 10*3/uL — ABNORMAL HIGH (ref 3.4–10.8)

## 2018-02-13 LAB — FETAL NONSTRESS TEST

## 2018-02-13 LAB — COMPREHENSIVE METABOLIC PANEL
A/G RATIO: 1.1 — AB (ref 1.2–2.2)
ALK PHOS: 124 IU/L — AB (ref 39–117)
ALT: 7 IU/L (ref 0–32)
AST: 11 IU/L (ref 0–40)
Albumin: 3.1 g/dL — ABNORMAL LOW (ref 3.5–5.5)
BUN/Creatinine Ratio: 13 (ref 9–23)
BUN: 6 mg/dL (ref 6–20)
CHLORIDE: 106 mmol/L (ref 96–106)
CO2: 19 mmol/L — ABNORMAL LOW (ref 20–29)
Calcium: 9.4 mg/dL (ref 8.7–10.2)
Creatinine, Ser: 0.48 mg/dL — ABNORMAL LOW (ref 0.57–1.00)
GFR calc Af Amer: 146 mL/min/{1.73_m2} (ref >59)
GFR calc non Af Amer: 127 mL/min/{1.73_m2} (ref >59)
GLUCOSE: 90 mg/dL (ref 65–99)
Globulin, Total: 2.9 g/dL (ref 1.5–4.5)
POTASSIUM: 4.2 mmol/L (ref 3.5–5.2)
Sodium: 139 mmol/L (ref 134–144)
TOTAL PROTEIN: 6 g/dL (ref 6.0–8.5)

## 2018-02-13 LAB — PROTEIN / CREATININE RATIO, URINE
Creatinine, Urine: 88.3 mg/dL
PROTEIN/CREAT RATIO: 409 mg/g{creat} — AB (ref 0–200)
Protein, Ur: 36.1 mg/dL

## 2018-02-13 NOTE — Progress Notes (Signed)
Pt reports no problems. Took diflucan today. AFI/NST today.

## 2018-02-13 NOTE — Progress Notes (Addendum)
HROB/ AFI for BMI>40 and gestational hypertension. Seen by Dr Bonney AidStaebler yesterday for leakage of fluid-had yeast infection. Blood pressure yesterday 138/92, 148/98 PRee labs yesterday: PC=409, plt 252K, cr, BUN, AST and ALT WNL. Woke up with a headache this AM, but it resolved with Tylenol.  No CP, SOB, visual changes.  Baby moving well. Occasional contraction Wants to breast and bottle feed Wants to have BTL. Desires no further children  BP 132/92, +1 proteinuria Heart: RRR without murmur Lungs: CTAB NST: Baseline 125-130 with accelerations to 160-170, moderate variability DTRS: +1 to +2 Cervix: closed/60%/-2  AFI: 9.11cm/ vertex  A: IUP at 36wk4d with preeclampsia without severe features. Reactive NST, AFI WNL Desires sterilization  P: Scheduled for IOL on 2/24 at 0800-most likely with Cytotec. Advised to call L&D if headaches worsen and no longer are responding to Tylenol or if she has any problems with visual changes, CP, SOB, VB, decreased FM. DIscussed POM with Dr Bonney AidStaebler Explained effectiveness of BTL, failure rate of 1/300 and risk of tubal pregnancy in that case, as well as risks of bleeding, infection, injury to other organs inside the pelvis like bladder, bowel and other blood vessels and the risks of anesthesia. Timing of BTL: may need to have interval tubal rather than pp tubal.  Kimberly Mcfarland, CNM

## 2018-02-15 NOTE — H&P (Signed)
OB History & Physical   History of Present Illness:  Chief Complaint:  Presents for induction of labor HPI:  Kimberly Mcfarland is a 37 y.o. G31P1001 female with EDC=03/09/2018 at 37wk1d dated by a 10 week ultrasound.  Her pregnancy has been complicated by obesity, AMA (normal XY on Informaseq), a family history of hemophilia (she has not been tested, her first son is not affected), and preeclampsia without severe features.  She presents to L&D for an induction of labor due to preeclampsia without severe features.   Has been having labile blood pressures and headaches since 02/05/2018. ON 02/12/2018 she began having mild range blood pressure elevations and her P/C ratio was 409 mgm. BUN, creatinine, AST, and ALT were normal. Headaches resolve with Tylenol. Total weight gain with pregnancy is 13#. Antepartum testing has been reassuring. She has had irregular contractions and increasing pelvic pressure, but denies vaginal bleeding and leakage of fluid. Denies visual changes, epigastric pain,CP, and SOB. History remarkable for preeclampsia with her first baby at term. Pelvis proven to 8#6oz Prenatal care also remarkable for : [facility] Prenatal Labs  Dating 10 week [place] Blood type: A pos  Genetic Screen Informaseq normal XY Antibody:negative  Anatomic [place] Remains incomplete for face and RVOT at 24 week follow up [X]  DP referral normal heart views Rubella: Immune   Varicella: Immune   GTT Early:  Opted for 3-hr with 4/4 values normal             Third trimester: 174 3-hr 86 / 158 / 158 / 112 RPR: NR  Rhogam NA HBsAg: negative  TDaP vaccine 01/02/18 Flu Shot: UTD HIV: negative  Baby Food       Br&BT                         GBS: negative  Contraception BTL Pap: 08/18/17 ASCUS HPV negative  CBB     CS/VBAC    Support Person         Patient desires permanent sterilization.Explained effectiveness of BTL, failure rate of 1/300 and risk of tubal pregnancy in that case, as well as risks of bleeding, infection,  injury to other organs inside the pelvis like bladder, bowel and other blood vessels and the risks of anesthesia. Timing of BTL: may need to have interval tubal rather than pp tubal.        Maternal Medical History:   Past Medical History:  Diagnosis Date  . Asthma    has not used inhaler in years  . Breast mass x 1  month   about a 1 cm mass Left at 2 o'clock per pt  . Complication of anesthesia    difficulty voiding postop  . Family history of hemophilia 05/04/2014   patient has never been tested  . GERD (gastroesophageal reflux disease)   . Heart murmur    as child  . Hypothyroid    h/o outgrew  . PCOS (polycystic ovarian syndrome)     Past Surgical History:  Procedure Laterality Date  . CYST EXCISION  2010   head cysts x 14.  high levels of keratin  . LESION EXCISION N/A 02/16/2016   Procedure: EXCISION SCALP LESION,excision 2 large scalp cysrts with intermediate closure;  Surgeon: Kieth Brightly, MD;  Location: [facility] ORS;  Service: General;  Laterality: N/A;  . WISDOM TOOTH EXTRACTION     OB History  Gravida Para Term Preterm AB Living  2 1 1  1  SAB TAB Ectopic Multiple Live Births          1    # Outcome Date GA Lbr Len/2nd Weight Sex Delivery Anes PTL Lv  2 Current           1 Term 07/18/14 5267w1d  8 lb 6 oz (3.799 kg) M Vag-Spont EPI  LIV     Complications: Meconium stained infant    Obstetric Comments  1st Menstrual Cycle:  12   1st Pregnancy:  32   Allergies  Allergen Reactions  . Sulfa Antibiotics Other (See Comments)    seizures  . Levaquin [Levofloxacin In D5w] Hives  . Penicillins Hives    Prior to Admission medications   Medication Sig Start Date End Date Taking? Authorizing Provider  albuterol (PROVENTIL HFA;VENTOLIN HFA) 108 (90 BASE) MCG/ACT inhaler Inhale 2 puffs into the lungs every 4 (four) hours as needed for wheezing or shortness of breath.   Yes [provider]  aspirin 81 MG chewable tablet Chew by mouth daily.    Yes [provider]  calcium carbonate (TUMS - DOSED IN MG ELEMENTAL CALCIUM) 500 MG chewable tablet Chew 1 tablet by mouth as needed for indigestion or heartburn.   Yes [provider]  Ferrous Sulfate (IRON) 325 (65 Fe) MG TABS Take by mouth.   Yes [provider]  fluconazole (DIFLUCAN) 150 MG tablet  02/12/18  Yes [provider]  Prenatal Vit-Fe Fumarate-FA (PRENATAL MULTIVITAMIN) TABS tablet Take 1 tablet by mouth daily at 12 noon.   Yes [provider]  ranitidine (ZANTAC) 150 MG tablet Take 150 mg by mouth 2 (two) times daily.   Yes [provider]     Social History: She  reports that  has never smoked. she has never used smokeless tobacco. She reports that she does not drink alcohol or use drugs.  Family History: family history includes Asthma in her mother; Breast cancer in her maternal great aunt; Diabetes in her maternal grandmother and mother; Factor VIII deficiency in her maternal grandfather and nephews x 2; Heart attack in her father; Heart disease in her father; Hypertension in her mother; Obesity in her mother.   Review of Systems: Negative x 10 systems reviewed except as noted in the HPI.      Physical Exam:  Vital Signs: BP (!) 132/92   Wt 258 lb (117 kg)   LMP 05/09/2017   BMI 45.70 kg/m  General: no acute distress.  HEENT: normocephalic, atraumatic Heart: regular rate & rhythm.  No murmurs Lungs: clear to auscultation bilaterally Abdomen: soft, gravid, non-tender;  EFW: 8-8.5# Pelvic:   External: Normal external female genitalia  Cervix: Dilation: Closed / Effacement (%): 60 / Station: -2 /vertex    Extremities: non-tender, symmetric, trace edema bilaterally.  Neurologic: Alert & oriented x 3.     Baseline FHR: 125-130 with accelerations to 160-170, moderate variability    Assessment:  Kimberly Mcfarland is a 37 y.o. 212P1001 female at 37wk1d with preeclampsia without severe features   Plan:  1. Admit  to Labor & Delivery  2. CBC, T&S, CMP, Clrs, IVF 3. GBS negative.   4. Consents obtained. 5. Discussed risks of continued observation vs risks of induction. Explained risks of induction whjich include hyperstimulation, fetal intolerance, Cesarean section, and failed induction. She wishes to proceed with induction.   Farrel ConnersColleen Miyeko Mahlum  02/16/2018 4:12 PM

## 2018-02-15 NOTE — Addendum Note (Signed)
Addended by: Farrel ConnersGUTIERREZ, Makenzey Nanni on: 02/15/2018 04:40 PM   Modules accepted: Kipp BroodSmartSet

## 2018-02-16 ENCOUNTER — Encounter: Payer: Self-pay | Admitting: Certified Nurse Midwife

## 2018-02-16 LAB — STREP GP B CULTURE+RFLX: STREP GP B CULTURE+RFLX: NEGATIVE

## 2018-02-17 ENCOUNTER — Inpatient Hospital Stay
Admission: EM | Admit: 2018-02-17 | Discharge: 2018-02-20 | DRG: 807 | Disposition: A | Discharge status: Home / Self Care | Payer: 59 | Attending: Obstetrics and Gynecology | Admitting: Obstetrics and Gynecology

## 2018-02-17 ENCOUNTER — Encounter: Payer: Self-pay | Admitting: *Deleted

## 2018-02-17 ENCOUNTER — Other Ambulatory Visit: Payer: Self-pay

## 2018-02-17 DIAGNOSIS — O09523 Supervision of elderly multigravida, third trimester: Secondary | ICD-10-CM

## 2018-02-17 DIAGNOSIS — Z832 Family history of diseases of the blood and blood-forming organs and certain disorders involving the immune mechanism: Secondary | ICD-10-CM

## 2018-02-17 DIAGNOSIS — E039 Hypothyroidism, unspecified: Secondary | ICD-10-CM | POA: Diagnosis not present

## 2018-02-17 DIAGNOSIS — O09529 Supervision of elderly multigravida, unspecified trimester: Secondary | ICD-10-CM

## 2018-02-17 DIAGNOSIS — J45909 Unspecified asthma, uncomplicated: Secondary | ICD-10-CM | POA: Diagnosis not present

## 2018-02-17 DIAGNOSIS — O1493 Unspecified pre-eclampsia, third trimester: Secondary | ICD-10-CM

## 2018-02-17 DIAGNOSIS — Z880 Allergy status to penicillin: Secondary | ICD-10-CM

## 2018-02-17 DIAGNOSIS — Z3A37 37 weeks gestation of pregnancy: Secondary | ICD-10-CM | POA: Diagnosis not present

## 2018-02-17 DIAGNOSIS — O9952 Diseases of the respiratory system complicating childbirth: Secondary | ICD-10-CM | POA: Diagnosis present

## 2018-02-17 DIAGNOSIS — K219 Gastro-esophageal reflux disease without esophagitis: Secondary | ICD-10-CM | POA: Diagnosis present

## 2018-02-17 DIAGNOSIS — O9962 Diseases of the digestive system complicating childbirth: Secondary | ICD-10-CM | POA: Diagnosis present

## 2018-02-17 DIAGNOSIS — O1404 Mild to moderate pre-eclampsia, complicating childbirth: Principal | ICD-10-CM | POA: Diagnosis present

## 2018-02-17 DIAGNOSIS — O99210 Obesity complicating pregnancy, unspecified trimester: Secondary | ICD-10-CM | POA: Diagnosis present

## 2018-02-17 DIAGNOSIS — I10 Essential (primary) hypertension: Secondary | ICD-10-CM | POA: Diagnosis not present

## 2018-02-17 DIAGNOSIS — O09299 Supervision of pregnancy with other poor reproductive or obstetric history, unspecified trimester: Secondary | ICD-10-CM

## 2018-02-17 DIAGNOSIS — O1490 Unspecified pre-eclampsia, unspecified trimester: Secondary | ICD-10-CM | POA: Diagnosis present

## 2018-02-17 LAB — COMPREHENSIVE METABOLIC PANEL
ALT: 15 U/L (ref 14–54)
AST: 24 U/L (ref 15–41)
Albumin: 2.5 g/dL — ABNORMAL LOW (ref 3.5–5.0)
Alkaline Phosphatase: 106 U/L (ref 38–126)
Anion gap: 9 (ref 5–15)
BUN: 9 mg/dL (ref 6–20)
CHLORIDE: 107 mmol/L (ref 101–111)
CO2: 21 mmol/L — ABNORMAL LOW (ref 22–32)
Calcium: 8.7 mg/dL — ABNORMAL LOW (ref 8.9–10.3)
Creatinine, Ser: 0.49 mg/dL (ref 0.44–1.00)
Glucose, Bld: 127 mg/dL — ABNORMAL HIGH (ref 65–99)
Potassium: 3.6 mmol/L (ref 3.5–5.1)
SODIUM: 137 mmol/L (ref 135–145)
Total Bilirubin: 0.3 mg/dL (ref 0.3–1.2)
Total Protein: 6.6 g/dL (ref 6.5–8.1)

## 2018-02-17 LAB — CBC
HEMATOCRIT: 31.1 % — AB (ref 35.0–47.0)
Hemoglobin: 10.6 g/dL — ABNORMAL LOW (ref 12.0–16.0)
MCH: 29.8 pg (ref 26.0–34.0)
MCHC: 34.1 g/dL (ref 32.0–36.0)
MCV: 87.3 fL (ref 80.0–100.0)
PLATELETS: 235 10*3/uL (ref 150–440)
RBC: 3.56 MIL/uL — AB (ref 3.80–5.20)
RDW: 14.6 % — AB (ref 11.5–14.5)
WBC: 12.4 10*3/uL — ABNORMAL HIGH (ref 3.6–11.0)

## 2018-02-17 LAB — TYPE AND SCREEN
ABO/RH(D): A POS
Antibody Screen: NEGATIVE

## 2018-02-17 MED ORDER — FAMOTIDINE 20 MG PO TABS
ORAL_TABLET | ORAL | Status: AC
  Filled 2018-02-17: qty 1

## 2018-02-17 MED ORDER — LACTATED RINGERS IV SOLN
INTRAVENOUS | Status: DC
  Administered 2018-02-17 – 2018-02-18 (×4): via INTRAVENOUS

## 2018-02-17 MED ORDER — OXYTOCIN BOLUS FROM INFUSION
500.0000 mL | Freq: Once | INTRAVENOUS | Status: AC
  Administered 2018-02-18: 500 mL via INTRAVENOUS

## 2018-02-17 MED ORDER — MISOPROSTOL 200 MCG PO TABS
ORAL_TABLET | ORAL | Status: AC
  Filled 2018-02-17: qty 4

## 2018-02-17 MED ORDER — OXYTOCIN 10 UNIT/ML IJ SOLN
INTRAMUSCULAR | Status: AC
  Filled 2018-02-17: qty 2

## 2018-02-17 MED ORDER — OXYTOCIN 40 UNITS IN LACTATED RINGERS INFUSION - SIMPLE MED
2.5000 [IU]/h | INTRAVENOUS | Status: DC
  Filled 2018-02-17: qty 1000

## 2018-02-17 MED ORDER — FAMOTIDINE 20 MG PO TABS
20.0000 mg | ORAL_TABLET | Freq: Two times a day (BID) | ORAL | Status: DC
  Administered 2018-02-17: 20 mg via ORAL

## 2018-02-17 MED ORDER — LACTATED RINGERS IV SOLN: 500.0000 mL | INTRAVENOUS | Status: DC | PRN

## 2018-02-17 MED ORDER — OXYTOCIN 10 UNIT/ML IJ SOLN: 10.0000 [IU] | Freq: Once | INTRAMUSCULAR | Status: DC

## 2018-02-17 MED ORDER — SOD CITRATE-CITRIC ACID 500-334 MG/5ML PO SOLN: 30.0000 mL | ORAL | Status: DC | PRN

## 2018-02-17 MED ORDER — MISOPROSTOL 25 MCG QUARTER TABLET
ORAL_TABLET | ORAL | Status: AC
  Filled 2018-02-17: qty 1

## 2018-02-17 MED ORDER — MISOPROSTOL 25 MCG QUARTER TABLET
25.0000 ug | ORAL_TABLET | ORAL | Status: DC | PRN
  Administered 2018-02-17 – 2018-02-18 (×4): 25 ug via VAGINAL
  Filled 2018-02-17 (×3): qty 1

## 2018-02-17 MED ORDER — AMMONIA AROMATIC IN INHA
RESPIRATORY_TRACT | Status: AC
  Filled 2018-02-17: qty 10

## 2018-02-17 MED ORDER — LIDOCAINE HCL (PF) 1 % IJ SOLN: 30.0000 mL | INTRAMUSCULAR | Status: DC | PRN

## 2018-02-17 MED ORDER — TERBUTALINE SULFATE 1 MG/ML IJ SOLN: 0.2500 mg | Freq: Once | INTRAMUSCULAR | Status: DC | PRN

## 2018-02-17 MED ORDER — ONDANSETRON HCL 4 MG/2ML IJ SOLN: 4.0000 mg | Freq: Four times a day (QID) | INTRAMUSCULAR | Status: DC | PRN

## 2018-02-17 NOTE — H&P (Signed)
History and physical update  See Complete History and Physical dated 02/16/2018 from 02/13/2018 visit  Patient presents for preeclampsia without severe features.  She has had occasional contractions, no vaginal bleeding, +FM, and no leakage of fluid.  She denies headaches, visual changes, and right upper quadrant pain.  Past pregnancy, medical, surgical, social, family histories reviewed with medications reviewed and unchanged from H&P on 02/13/2018. Total weight gain this pregnancy: 14 pounds Pelvis proven to 8 lb 6 oz Last growth ultrasound on 12/06/17: 57th %ile  Vital Signs: Pulse 78   Temp 98.2 F (36.8 C) (Oral)   Resp 16   Ht 5\' 3"  (1.6 m)   Wt 259 lb (117.5 kg)   LMP 05/09/2017   BMI 45.88 kg/m  Constitutional: Well nourished, well developed female in no acute distress.  HEENT: normal Skin: Warm and dry.  Cardiovascular: Regular rate and rhythm.   Extremity: no edema  Respiratory: Clear to auscultation bilateral. Normal respiratory effort Abdomen: FHT present and gravid, NT Back: no CVAT Neuro: DTRs 2+, Cranial nerves grossly intact Psych: Alert and Oriented x3. No memory deficits. Normal mood and affect.  MS: normal gait, normal bilateral lower extremity ROM/strength/stability.  Pelvic exam: CVX 1/60/-3 per RN  Fetal Heart Rate Tracing: Baseline FHR: 130 beats/min Variability: moderate Accelerations: present Decelerations: absent Tocometry: infrequent  A/P: 37 y.o. 562P1001 female with preeclampsia without severe features, here for induction of labor  - Cytotec for cervical ripening - FWB: reassuring.  EFW: 7.5 pounds - GBS negative on 2/19 - preeclampsia labs drawn.  Thomasene MohairStephen Stephaine Breshears, MD 02/17/2018 10:56 AM

## 2018-02-18 ENCOUNTER — Encounter: Payer: Self-pay | Admitting: Anesthesiology

## 2018-02-18 ENCOUNTER — Inpatient Hospital Stay: Payer: 59 | Admitting: Anesthesiology

## 2018-02-18 DIAGNOSIS — O1404 Mild to moderate pre-eclampsia, complicating childbirth: Secondary | ICD-10-CM

## 2018-02-18 DIAGNOSIS — Z3A37 37 weeks gestation of pregnancy: Secondary | ICD-10-CM

## 2018-02-18 MED ORDER — COCONUT OIL OIL
1.0000 "application " | TOPICAL_OIL | Status: DC | PRN
  Administered 2018-02-18: 1 via TOPICAL
  Filled 2018-02-18: qty 120

## 2018-02-18 MED ORDER — DIPHENHYDRAMINE HCL 25 MG PO CAPS: 25.0000 mg | ORAL_CAPSULE | Freq: Four times a day (QID) | ORAL | Status: DC | PRN

## 2018-02-18 MED ORDER — FERROUS SULFATE 325 (65 FE) MG PO TABS
325.0000 mg | ORAL_TABLET | Freq: Two times a day (BID) | ORAL | Status: DC
  Administered 2018-02-18 – 2018-02-20 (×4): 325 mg via ORAL
  Filled 2018-02-18 (×4): qty 1

## 2018-02-18 MED ORDER — SENNOSIDES-DOCUSATE SODIUM 8.6-50 MG PO TABS
2.0000 | ORAL_TABLET | ORAL | Status: DC
  Filled 2018-02-18 (×2): qty 2

## 2018-02-18 MED ORDER — SIMETHICONE 80 MG PO CHEW: 80.0000 mg | CHEWABLE_TABLET | ORAL | Status: DC | PRN

## 2018-02-18 MED ORDER — ONDANSETRON HCL 4 MG/2ML IJ SOLN: 4.0000 mg | INTRAMUSCULAR | Status: DC | PRN

## 2018-02-18 MED ORDER — BUPIVACAINE HCL (PF) 0.25 % IJ SOLN
INTRAMUSCULAR | Status: DC | PRN
  Administered 2018-02-18: 5 mL via EPIDURAL

## 2018-02-18 MED ORDER — PHENYLEPHRINE 40 MCG/ML (10ML) SYRINGE FOR IV PUSH (FOR BLOOD PRESSURE SUPPORT)
80.0000 ug | PREFILLED_SYRINGE | INTRAVENOUS | Status: DC | PRN
  Filled 2018-02-18: qty 5

## 2018-02-18 MED ORDER — HYDROCODONE-ACETAMINOPHEN 5-325 MG PO TABS: 1.0000 | ORAL_TABLET | Freq: Four times a day (QID) | ORAL | Status: DC | PRN

## 2018-02-18 MED ORDER — IBUPROFEN 600 MG PO TABS
600.0000 mg | ORAL_TABLET | Freq: Four times a day (QID) | ORAL | Status: DC
  Administered 2018-02-18 – 2018-02-20 (×3): 600 mg via ORAL
  Filled 2018-02-18 (×5): qty 1

## 2018-02-18 MED ORDER — WITCH HAZEL-GLYCERIN EX PADS: 1.0000 "application " | MEDICATED_PAD | CUTANEOUS | Status: DC | PRN

## 2018-02-18 MED ORDER — FAMOTIDINE 20 MG PO TABS
20.0000 mg | ORAL_TABLET | Freq: Two times a day (BID) | ORAL | Status: DC
  Administered 2018-02-18 – 2018-02-20 (×5): 20 mg via ORAL
  Filled 2018-02-18 (×5): qty 1

## 2018-02-18 MED ORDER — FENTANYL 2.5 MCG/ML W/ROPIVACAINE 0.15% IN NS 100 ML EPIDURAL (ARMC)
12.0000 mL/h | EPIDURAL | Status: DC
  Administered 2018-02-18: 12 mL/h via EPIDURAL

## 2018-02-18 MED ORDER — BUTORPHANOL TARTRATE 1 MG/ML IJ SOLN
1.0000 mg | INTRAMUSCULAR | Status: DC | PRN
  Administered 2018-02-18 (×2): 1 mg via INTRAVENOUS
  Filled 2018-02-18: qty 1

## 2018-02-18 MED ORDER — EPHEDRINE 5 MG/ML INJ
10.0000 mg | INTRAVENOUS | Status: DC | PRN
  Filled 2018-02-18: qty 2

## 2018-02-18 MED ORDER — ONDANSETRON HCL 4 MG PO TABS: 4.0000 mg | ORAL_TABLET | ORAL | Status: DC | PRN

## 2018-02-18 MED ORDER — DIBUCAINE 1 % RE OINT: 1.0000 "application " | TOPICAL_OINTMENT | RECTAL | Status: DC | PRN

## 2018-02-18 MED ORDER — DIPHENHYDRAMINE HCL 50 MG/ML IJ SOLN: 12.5000 mg | INTRAMUSCULAR | Status: DC | PRN

## 2018-02-18 MED ORDER — PRENATAL MULTIVITAMIN CH
1.0000 | ORAL_TABLET | Freq: Every day | ORAL | Status: DC
  Administered 2018-02-18 – 2018-02-19 (×2): 1 via ORAL
  Filled 2018-02-18 (×2): qty 1

## 2018-02-18 MED ORDER — BENZOCAINE-MENTHOL 20-0.5 % EX AERO: 1.0000 "application " | INHALATION_SPRAY | CUTANEOUS | Status: DC | PRN

## 2018-02-18 MED ORDER — BUTORPHANOL TARTRATE 1 MG/ML IJ SOLN
INTRAMUSCULAR | Status: AC
  Filled 2018-02-18: qty 1

## 2018-02-18 MED ORDER — LACTATED RINGERS IV SOLN: 500.0000 mL | Freq: Once | INTRAVENOUS | Status: DC

## 2018-02-18 MED ORDER — FENTANYL 2.5 MCG/ML W/ROPIVACAINE 0.15% IN NS 100 ML EPIDURAL (ARMC)
EPIDURAL | Status: AC
  Filled 2018-02-18: qty 100

## 2018-02-18 MED ORDER — ACETAMINOPHEN 325 MG PO TABS
650.0000 mg | ORAL_TABLET | ORAL | Status: DC | PRN
  Administered 2018-02-19 – 2018-02-20 (×5): 650 mg via ORAL
  Filled 2018-02-18 (×6): qty 2

## 2018-02-18 NOTE — Lactation Note (Signed)
This note was copied from a baby's chart. Lactation Consultation Note Kimberly Mcfarland continues to struggle with maintaining deep latch d/t hard flattish nipples and areola, pulling in lower lip and slightly receding chin.  Set up Symphony pump in room with instructions in pumping, handling of colostrum/breast milk, collection, storage and labeling.  Pre pumping initiated to soften areola and help evert nipples for deeper sustained latch.  Once #24 nipple shield started, he was able to keep flanged lips and continue with strong rhythmic sucking.  Mom returned demonstration of placing nipple shield and latching [name] on her own.  Encouraged to call for question, concerns or if further assistance needed.  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

## 2018-02-18 NOTE — Discharge Summary (Signed)
OB Discharge Summary     Patient Name: Kimberly Mcfarland DOB: 1981-09-30 MRN: [id number]  Date of admission: 02/17/2018 Delivering MD: Thomasene Mohair, MD  Date of Delivery: 02/18/2018  Date of discharge: 02/20/2018 Admitting diagnosis: scheduled induction Intrauterine pregnancy: [redacted]w[redacted]d     Secondary diagnosis: Preeclampsia     Discharge diagnosis: Term Pregnancy Delivered and Preeclampsia (mild)                                                                                                Post partum procedures:none  Augmentation: Cytotec  Complications: None  Hospital course:  Induction of Labor With Vaginal Delivery   37 y.o. yo G2P1001 at [redacted]w[redacted]d was admitted to the hospital 02/17/2018 for induction of labor.  Indication for induction: Preeclampsia.  Patient had an uncomplicated labor course as follows: Membrane Rupture Time/Date: 2:00 AM ,02/18/2018   Intrapartum Procedures: Episiotomy: None [1]                                         Lacerations:  None [1]  Patient had delivery of a Viable infant.  Information for the patient's newborn:  Cash, Meadow [[id number]]  Delivery Method: Vag-Spont   02/18/2018  Details of delivery can be found in separate delivery note.  Patient had a routine postpartum course. Patient is discharged home 02/20/18.  Physical exam  Vitals:   02/19/18 2034 02/20/18 0021 02/20/18 0430 02/20/18 0808  BP: 134/87 127/86 111/63 123/86  Pulse: 76 67 69 72  Resp: 15 18    Temp: 97.9 F (36.6 C) 98.2 F (36.8 C) 98.5 F (36.9 C) 98.5 F (36.9 C)  TempSrc: Oral Oral Axillary Oral  SpO2: 100% 97% 97% 98%  Weight:      Height:       General: alert, cooperative and no distress Lochia: appropriate Uterine Fundus: firm/ ML/ NT  DVT Evaluation: No evidence of DVT seen on physical exam.  Labs: Lab Results  Component Value Date   WBC 10.0 02/19/2018   HGB 8.2 (L) 02/19/2018   HCT 24.3 (L) 02/19/2018   MCV 88.0 02/19/2018   PLT 181 02/19/2018     Discharge instruction: per After Visit Summary.  Medications:  Allergies as of 02/20/2018      Reactions   Sulfa Antibiotics Other (See Comments)   seizures   Levaquin [levofloxacin In D5w] Hives   Penicillins Hives      Medication List    STOP taking these medications   aspirin 81 MG chewable tablet   fluconazole 150 MG tablet Commonly known as:  DIFLUCAN     TAKE these medications   acetaminophen 500 MG tablet Commonly known as:  TYLENOL Take 1,000 mg by mouth every 6 (six) hours as needed for headache.   albuterol 108 (90 Base) MCG/ACT inhaler Commonly known as:  PROVENTIL HFA;VENTOLIN HFA Inhale 2 puffs into the lungs every 4 (four) hours as needed for wheezing or shortness of breath.   calcium carbonate 500 MG chewable tablet Commonly known  as:  TUMS - dosed in mg elemental calcium Chew 1 tablet by mouth as needed for indigestion or heartburn.   ibuprofen 600 MG tablet Commonly known as:  ADVIL,MOTRIN Take 1 tablet (600 mg total) by mouth every 6 (six) hours.   Iron 325 (65 Fe) MG Tabs Take 1 tablet (325 mg total) by mouth 2 (two) times daily. What changed:    how much to take  when to take this   prenatal multivitamin Tabs tablet Take 1 tablet by mouth daily at 12 noon.   ranitidine 150 MG tablet Commonly known as:  ZANTAC Take 150 mg by mouth 2 (two) times daily.     Can also take Colace 100 mgm once or twice a day to prevent constipation on ferrous sulfate  Diet: routine diet  Activity: Advance as tolerated. Pelvic rest for 6 weeks.   Outpatient follow up: Follow-up Information    Conard NovakJackson, Stephen D, MD. Schedule an appointment as soon as possible for a visit.   Specialty:  Obstetrics and Gynecology Why:  for blood pressure check in 5-6 days Contact information: [address] [place] [id number] [phone]             Postpartum contraception: desires interval tubal Rhogam Given postpartum: no Rubella vaccine given  postpartum: no Varicella vaccine given postpartum: no TDaP given antepartum or postpartum: received 01/02/18 Flu vaccine: up to date  Newborn Data: Live born child -Samuel GermanyGage Birth Weight: 7 lb 2.6 oz (3250 g) APGAR: 8, 9  Newborn Delivery   Birth date/time:  02/18/2018 08:40:00 Delivery type:  Vaginal, Spontaneous     Baby Feeding: breast feeding  Disposition:home with mother  SIGNED: Farrel Connersolleen Aysha Livecchi, CNM

## 2018-02-18 NOTE — Progress Notes (Signed)
Patient ID: Kimberly Mcfarland, female   DOB: 10-29-1981, 37 y.o.   MRN: [id number]  Labor Check  Subj:  Complaints: comfortable with epidural   Obj:  BP 129/81   Pulse 75   Temp 98 F (36.7 C) (Oral)   Resp 14   Ht 5\' 3"  (1.6 m)   Wt 259 lb (117.5 kg)   LMP 05/09/2017   SpO2 95%   BMI 45.88 kg/m     Cervix: Dilation: 7 / Effacement (%): 100 / Station: -3   AROM of forebag Baseline FHR: 120 beats/min   Variability: moderate   Accelerations: present   Decelerations: present (variable decelerations (depth and length difficult to distinguish as the FHR is not tracing well with contractions), return to baseline with moderate variability and accelerations Contractions: present frequency: not tracing well 2-3 q 10 min Overall assessment: cat 2   A/P: 37 y.o. G2P1001 female at 1139w2d with IOL for preeclampsai.  1.  Labor: continue expectant management. Add pitocin once fetal tracing cat 1 and if no change  2.  FWB: reassuring overall, Overall assessment: category 2.  Maternal position changes.  IUPC with amnioinfusion, if no improvement.  3.  GBS neg  4.  Pain: epidural 5.  Recheck: prn   Thomasene MohairStephen Arnice Vanepps, MD 02/18/2018 7:33 AM

## 2018-02-18 NOTE — Anesthesia Preprocedure Evaluation (Signed)
Anesthesia Evaluation  Patient identified by MRN, date of birth, ID band Patient awake    Reviewed: Allergy & Precautions, NPO status , Patient's Chart, lab work & pertinent test results, reviewed documented beta blocker date and time   Airway Mallampati: III  TM Distance: >3 FB     Dental  (+) Chipped   Pulmonary asthma ,           Cardiovascular hypertension, + Valvular Problems/Murmurs      Neuro/Psych    GI/Hepatic GERD  Controlled,  Endo/Other  Hypothyroidism   Renal/GU      Musculoskeletal   Abdominal   Peds  Hematology   Anesthesia Other Findings   Reproductive/Obstetrics                             Anesthesia Physical Anesthesia Plan  ASA: III  Anesthesia Plan: Epidural   Post-op Pain Management:    Induction:   PONV Risk Score and Plan:   Airway Management Planned:   Additional Equipment:   Intra-op Plan:   Post-operative Plan:   Informed Consent: I have reviewed the patients History and Physical, chart, labs and discussed the procedure including the risks, benefits and alternatives for the proposed anesthesia with the patient or authorized representative who has indicated his/her understanding and acceptance.     Plan Discussed with: CRNA  Anesthesia Plan Comments:         Anesthesia Quick Evaluation

## 2018-02-18 NOTE — Anesthesia Postprocedure Evaluation (Signed)
Anesthesia Post Note  Patient: Kimberly Mcfarland  Procedure(s) Performed: AN AD HOC LABOR EPIDURAL  Anesthesia Type: Epidural     Last Vitals:  Vitals:   02/18/18 0904 02/18/18 0919  BP: 123/79 (!) 122/91  Pulse: 86 81  Resp:    Temp:    SpO2:      Last Pain:  Vitals:   02/18/18 0735  TempSrc: Oral  PainSc:                  Yevette EdwardsJames G Carmellia Kreisler

## 2018-02-18 NOTE — Plan of Care (Signed)
Vs stable; tolerating regular diet; only taking scheduled motrin for pain control; does need assistance breastfeeding; has nipple shield already

## 2018-02-18 NOTE — Anesthesia Procedure Notes (Signed)
Epidural Patient location during procedure: OB  Staffing Anesthesiologist: Cataleyah Colborn, MD Performed: anesthesiologist   Preanesthetic Checklist Completed: patient identified, site marked, surgical consent, pre-op evaluation, timeout performed, IV checked, risks and benefits discussed and monitors and equipment checked  Epidural Patient position: sitting Prep: ChloraPrep Patient monitoring: heart rate, continuous pulse ox and blood pressure Approach: midline Location: L4-L5 Injection technique: LOR saline  Needle:  Needle type: Tuohy  Needle gauge: 18 G Needle length: 9 cm and 9 Catheter type: closed end flexible Catheter size: 20 Guage Test dose: negative and 1.5% lidocaine with Epi 1:200 K  Assessment Sensory level: T10 Events: blood not aspirated, injection not painful, no injection resistance, negative IV test and no paresthesia  Additional Notes   Patient tolerated the insertion well without complications.Reason for block:procedure for pain     

## 2018-02-18 NOTE — Lactation Note (Signed)
This note was copied from a baby's chart. Lactation Consultation Note  Patient Name: Boy Sheron NightingaleMary Nedrow JXBJY'NToday's Date: 02/18/2018 Reason for consult: Initial assessment;Difficult latch;Early term 5937-38.6wks Assisted mom with 1st time breastfeeding positioning with pillows in modified cradle hold on left breast skin to skin.  Mom has large breasts with large, hard areola and nipple.  Right nipple is flat but can compress in tea cup hold to get Gage to latch.  Demonstrated hand expression and a couple of drops were achieved.  At first the latch is shallow and he sucks in lower lip losing seal and good suction.  Mom reports having same issue of sucking in lower lip with first baby, but he never sucked this strong and she continued to have latch issues.  She kept trying with first for a week, but he continued to struggle with latching and when he started losing weight she stopped breast feeding and pumped for a while.  Once deep enough latch is achieved, he begins good, rhythmic sucking with only a couple of swallows heard.  LC has to keep breast compressed to keep him sucking effectively.  After 15 to 20 minutes of periodic sucking on right breast, we switched him to the left breast in football hold.  Left nipples was more everted and we could maintain deeper latch with stronger sucking.  After 15 to 20 more minutes at the left breast, he fell asleep even with frequent stimulation.  LC had to remain with mom through entire feeding because she kept drifting off to sleep.  Mom asking about pumping stating she still had Medela DEBP from first baby.  Disussed starting mom pumping later to assist with softening areola and everting nipples.  Praised mom for good work and encouraged continue to call for assistance.  Maternal Data Formula Feeding for Exclusion: No Has patient been taught Hand Expression?: Yes Does the patient have breastfeeding experience prior to this delivery?: Yes  Feeding Feeding Type: Breast  Fed Length of feed: 20 min  LATCH Score Latch: Repeated attempts needed to sustain latch, nipple held in mouth throughout feeding, stimulation needed to elicit sucking reflex.  Audible Swallowing: A few with stimulation  Type of Nipple: Everted at rest and after stimulation  Comfort (Breast/Nipple): Soft / non-tender  Hold (Positioning): Assistance needed to correctly position infant at breast and maintain latch.  LATCH Score: 7  Interventions Interventions: Breast feeding basics reviewed;Assisted with latch;Skin to skin;Breast massage;Hand express;Reverse pressure;Breast compression;Adjust position;Support pillows;Position options  Lactation Tools Discussed/Used WIC Program: [employer])   Consult Status Consult Status: Follow-up Date: 02/18/18 Follow-up type: Call as needed    Louis MeckelWilliams, Seraiah Nowack Kay 02/18/2018, 10:55 AM

## 2018-02-19 ENCOUNTER — Telehealth: Payer: Self-pay

## 2018-02-19 LAB — CBC
HCT: 24.3 % — ABNORMAL LOW (ref 35.0–47.0)
HEMOGLOBIN: 8.2 g/dL — AB (ref 12.0–16.0)
MCH: 29.5 pg (ref 26.0–34.0)
MCHC: 33.6 g/dL (ref 32.0–36.0)
MCV: 88 fL (ref 80.0–100.0)
Platelets: 181 10*3/uL (ref 150–440)
RBC: 2.76 MIL/uL — ABNORMAL LOW (ref 3.80–5.20)
RDW: 14.4 % (ref 11.5–14.5)
WBC: 10 10*3/uL (ref 3.6–11.0)

## 2018-02-19 LAB — RPR: RPR: NONREACTIVE

## 2018-02-19 NOTE — Telephone Encounter (Signed)
FMLA/DISABILITY form for [employer] filled out, signature obtained, and given to [place] for processing. 

## 2018-02-19 NOTE — Progress Notes (Signed)
Subjective:  Pain well controlled on oral analgesics.  Tolerating po.  Minimal to moderate lochia.  No fevers or chills.    Objective:   Blood pressure 124/79, pulse 76, temperature 98.1 F (36.7 C), temperature source Oral, resp. rate 20, height _0  (1.6 m), weight 259 lb (117.5 kg), last menstrual period 05/09/2017, SpO2 100 %, unknown if currently breastfeeding.  General: NAD Pulmonary: no increased work of breathing Abdomen: non-distended, non-tender, fundus firm at level of umbilicus Extremities: no edema, no erythema, no tenderness  Results for orders placed or performed during the hospital encounter of 02/17/18 (from the past 72 hour(s))  CBC     Status: Abnormal   Collection Time: 02/17/18  8:34 AM  Result Value Ref Range   WBC 12.4 (H) 3.6 - 11.0 K/uL   RBC 3.56 (L) 3.80 - 5.20 MIL/uL   Hemoglobin 10.6 (L) 12.0 - 16.0 g/dL   HCT 31.1 (L) 35.0 - 47.0 %   MCV 87.3 80.0 - 100.0 fL   MCH 29.8 26.0 - 34.0 pg   MCHC 34.1 32.0 - 36.0 g/dL   RDW 14.6 (H) 11.5 - 14.5 %   Platelets 235 150 - 440 K/uL    Comment: Performed at [facility], [address] [place], [place] [address]  Type and screen [place]     Status: None   Collection Time: 02/17/18  8:34 AM  Result Value Ref Range   ABO/RH(D) A POS    Antibody Screen NEG    Sample Expiration      02/20/2018 Performed at [facility], [address] [place], [place] [address]   RPR     Status: None   Collection Time: 02/17/18  8:34 AM  Result Value Ref Range   RPR Ser Ql Non Reactive Non Reactive    Comment: (NOTE) Performed At: [facility] [place], [place] [id number] Rush Farmer MD [phone] Performed at [facility], [address] [place], [place] [address]   Comprehensive metabolic panel     Status: Abnormal   Collection Time: 02/17/18  8:34 AM  Result Value Ref Range   Sodium 137 135 - 145 mmol/L   Potassium 3.6 3.5 -  5.1 mmol/L   Chloride 107 101 - 111 mmol/L   CO2 21 (L) 22 - 32 mmol/L   Glucose, Bld 127 (H) 65 - 99 mg/dL   BUN 9 6 - 20 mg/dL   Creatinine, Ser 0.49 0.44 - 1.00 mg/dL   Calcium 8.7 (L) 8.9 - 10.3 mg/dL   Total Protein 6.6 6.5 - 8.1 g/dL   Albumin 2.5 (L) 3.5 - 5.0 g/dL   AST 24 15 - 41 U/L   ALT 15 14 - 54 U/L   Alkaline Phosphatase 106 38 - 126 U/L   Total Bilirubin 0.3 0.3 - 1.2 mg/dL   GFR calc non Af Amer >60 >60 mL/min   GFR calc Af Amer >60 >60 mL/min    Comment: (NOTE) The eGFR has been calculated using the CKD EPI equation. This calculation has not been validated in all clinical situations. eGFR's persistently <60 mL/min signify possible Chronic Kidney Disease.    Anion gap 9 5 - 15    Comment: Performed at [facility], [address] [place], [place] [address]  CBC     Status: Abnormal   Collection Time: 02/19/18  4:43 AM  Result Value Ref Range   WBC 10.0 3.6 - 11.0 K/uL  RBC 2.76 (L) 3.80 - 5.20 MIL/uL   Hemoglobin 8.2 (L) 12.0 - 16.0 g/dL    Comment: RESULT REPEATED AND VERIFIED   HCT 24.3 (L) 35.0 - 47.0 %   MCV 88.0 80.0 - 100.0 fL   MCH 29.5 26.0 - 34.0 pg   MCHC 33.6 32.0 - 36.0 g/dL   RDW 14.4 11.5 - 14.5 %   Platelets 181 150 - 440 K/uL    Comment: Performed at [facility], [address] [place], [place] [address]    Assessment:   37 y.o. [phone] postpartum day # 1 TSVD IOL for mild preeclampsia   Plan:    1) Acute blood loss anemia - hemodynamically stable and asymptomatic - po ferrous sulfate  2) Blood Type --/--/A POS (02/24 7004) / Rubella 3.85 (09/19 0816) / Varicella Immune  3) TDAP status up to date  4) Feeding plan breast  5)  Education given regarding options for contraception, as well as compatibility with breast feeding if applicable.  Patient plans on tubal ligation for contraception.  6) Mild Preeclampsia - normotensive postpartum, 1 week BP check  7) Disposition anticipate discharge [place], MD, [place], [facility] 02/19/2018, 8:59 AM

## 2018-02-20 ENCOUNTER — Telehealth: Payer: Self-pay | Admitting: Certified Nurse Midwife

## 2018-02-20 DIAGNOSIS — O9962 Diseases of the digestive system complicating childbirth: Secondary | ICD-10-CM | POA: Diagnosis not present

## 2018-02-20 DIAGNOSIS — O1404 Mild to moderate pre-eclampsia, complicating childbirth: Secondary | ICD-10-CM | POA: Diagnosis not present

## 2018-02-20 DIAGNOSIS — Z880 Allergy status to penicillin: Secondary | ICD-10-CM | POA: Diagnosis not present

## 2018-02-20 DIAGNOSIS — K219 Gastro-esophageal reflux disease without esophagitis: Secondary | ICD-10-CM | POA: Diagnosis not present

## 2018-02-20 DIAGNOSIS — O9952 Diseases of the respiratory system complicating childbirth: Secondary | ICD-10-CM | POA: Diagnosis not present

## 2018-02-20 DIAGNOSIS — J45909 Unspecified asthma, uncomplicated: Secondary | ICD-10-CM | POA: Diagnosis not present

## 2018-02-20 MED ORDER — IRON 325 (65 FE) MG PO TABS: 1.0000 | ORAL_TABLET | Freq: Two times a day (BID) | ORAL | 1 refills | Status: DC

## 2018-02-20 MED ORDER — IBUPROFEN 600 MG PO TABS: 600.0000 mg | ORAL_TABLET | Freq: Four times a day (QID) | ORAL | 0 refills | Status: DC

## 2018-02-20 NOTE — Telephone Encounter (Signed)
Patient aware that FMLA paperwork has been faxed through to [employer]. Stated will be by to bring office to bring more paperwork.

## 2018-02-20 NOTE — Progress Notes (Signed)
Patient discharged home with infant. Discharge instructions and prescriptions given and reviewed with patient. Patient verbalized understanding. Escorted out by auxillary.  

## 2018-02-20 NOTE — Lactation Note (Signed)
This note was copied from a baby's chart. Lactation Consultation Note  Patient Name: Kimberly Mcfarland JYNWG'N Date: 02/20/2018 Reason for consult: Follow-up assessment   Baby is at 7 % weight loss today and mom states urine was dark with "crystals" in it this morning. His mouth is a bit dry. She says he only nurses a few minutes at a time now and "just quits" and later acts hungry. She said it took over 1 one week for her milk to "come in" with first baby and she has hx low thyroid and PCOS all of which can decrease a mom's milk supply. Her breasts are quite soft. She can express a couple drops of golden colostrum (needs practice. Gave her handout and demo'd it again).   Despite feeding cues, when he went to breast, he soon ignored it and went to sleep and showed feeding cues again when away from mom. It had been > 6 hours from last "good" feeding, per mom. She was unable to express enough colostrum to entice him, so I instilled formula into nipple shield to get him interested. (Parents planned to formula feed at home any way they said because of past and current issues). It did help him start to nurse and then he kept going with vigorous suck/swallow pattern where we heard some swallows and mom did feel cramping. He took a total of 2 ml and nursed for approx 15 minutes. MOm states she can never get him to take the second breast. Too much work just to get him to do well on one. Mom did use SNS with other child and is not interested now in using it. She knows the syringe feed is short term plan for 1-2 days. If supplement still needed after that, sns or bottle feeds will be more realistic feeding plan.    PLAN: * At least 8 "breast sessions" per 24 hours (at least 15 minutes of vigorous suckling from breast by baby or breast pump)   And at least 8 sessions of feedings where baby is satisfied (no rooting, cueing) and urine becomes clearer without crystals and heavier in volume. He should have an overall  increase in diapers over next few days; If he is not satisfied with breastfeeding/breastmilk alone, supplement with at least 10 ml formula and adjust volume over time as needed to reach feeding goals of well fed, satisfied baby. OUT PATIENT consult with IBCLC here at [facility] at 1 pm to review feeding plan/progress. Mom told to arrive for consult with feeding diary; pump supplies; shield and hungry baby who has not fed within 2 hours of consult. She has LC contact information. Report given to RN. Family states they agree with plan and believe they can follow it at home.    Maternal Data    Feeding Feeding Type: Breast Fed(left breast; football hold)  LATCH Score Latch: Grasps breast easily, tongue down, lips flanged, rhythmical sucking.(wiht NS)  Audible Swallowing: A few with stimulation(none till formula instilled into shield)  Type of Nipple: Everted at rest and after stimulation(needs stim to evert)  Comfort (Breast/Nipple): Soft / non-tender  Hold (Positioning): Assistance needed to correctly position infant at breast and maintain latch.  LATCH Score: 8  Interventions Interventions: Assisted with latch;Skin to skin;Hand express;Breast compression;Adjust position;Support pillows;Position options;Expressed milk  Lactation Tools Discussed/Used Tools: Nipple Elzabeth Mcquerry Nipple shield size: 24   Consult Status Consult Status: Follow-up Date: 02/23/18(1 pm with LC at [facility] to review plan) Follow-up type: Out-patient    Kimberly Mcfarland  Kimberly Mcfarland 02/20/2018, 12:13 PM

## 2018-02-23 ENCOUNTER — Ambulatory Visit: Payer: Self-pay

## 2018-02-23 NOTE — Lactation Note (Signed)
This note was copied from a baby's chart. Lactation Consultation Note Kimberly Mcfarland seen today as out patient d/t weight loss, poor feedings with poor intake and hx jaundice.  Transcutaneous bilirubin has decreased to 10.3 today and does not appear jaundiced.  Mom has history of low thryroid and PCOS.  Mom reports milk did not come in until a week with first.  Mom only breast fed first baby for 1 week d/t latch issues.  Mom tried SNS with first without success and does not want to start that with this baby.  Mom states Kimberly Mcfarland is much better feeder than first baby, but still falls asleep and comes off breast.  Mom reports being unable to get him on without nipple shield.  Mom felt like mature milk really just came in good supply last night.  Mom is waking Kimberly Mcfarland now every 2 hours to feed.  He has void and yellow/orangish seedy stool about every 2 hours per mom.  Mom is pumping 2 oz now and can get Kimberly Mcfarland to take 1 1/2 oz but takes him about 30 minutes.  Today's nude weight is 3006 gms so he is still not up to birth weight yet.  He had just had a large void and stool before weighing.  His pre feeding weight with pamper was 3024 gms.  Assisted mom with latching Kimberly Mcfarland to left breast without nipple shield.  Mom is more comfortable in football hold using boppy pillow.  Initially heard swallows, but then was unable to sustain latch.  He kept sliding to tip of nipple and falling asleep.  After 15 minutes of mostly sleeping at the breast, his post weight was 3030 gms with him only taking in 6 ml.  Observed mom independently latch him to right breast with nipple shield.  He was able to maintain latch with nipple shield, but still kept falling asleep requiring frequent stimulation for nutritive sucking after 5 to 10 minutes into feeding.  Per pre and post feeding weight, he took in 18 ml on right breast with nipple shield for a total of 24 ml.  Encouraged mom to continue to breast feed Kimberly at least every 3 hours and for now pump after  breast feed for 20 minutes.  If she felt feeding was poor or no feeding, to supplement with at least 1 1/2 oz of expressed breast milk.  If out put decreased or Kimberly Mcfarland became more jaundiced or lethargic to the point of him not feeding to call Pediatrician.  [facility] concerns, questions or felt like assistance was needed.  Encouraged mom to come back for weight check in next 24 to 48 hours to reassess plan of care if needed.                      Patient Name: Kimberly HummerGage Jayden Mcfarland VHQIO'NToday's Date: 02/23/2018 Reason for consult: Follow-up assessment;Difficult latch;Infant weight loss   Maternal Data Formula Feeding for Exclusion: No Has patient been taught Hand Expression?: Yes Does the patient have breastfeeding experience prior to this delivery?: Yes  Feeding Feeding Type: Breast Fed Length of feed: 40 min  LATCH Score Latch: Repeated attempts needed to sustain latch, nipple held in mouth throughout feeding, stimulation needed to elicit sucking reflex.(Sustains latch better with NS)  Audible Swallowing: Spontaneous and intermittent(but falls asleep quickly into BF)  Type of Nipple: Everted at rest and after stimulation  Comfort (Breast/Nipple): Filling, red/small blisters or bruises, mild/mod discomfort  Hold (Positioning): No assistance needed to  correctly position infant at breast.  LATCH Score: 8  Interventions Interventions: Assisted with latch;Skin to skin;Breast massage;Hand express;Reverse pressure;Breast compression;Adjust position;Support pillows;Position options  Lactation Tools Discussed/Used Tools: Nipple Shields(Used NS on rt - got to latch to lt without NS, but less milk) Nipple shield size: 24 WIC Program: No([employer])   Consult Status Consult Status: PRN Follow-up type: (Encouraged mom to come in for out pt wt check Monday)    Louis Meckel 02/23/2018, 2:35 PM

## 2018-02-23 NOTE — Lactation Note (Signed)
This note was copied from a baby's chart. Lactation Consultation Note  Patient Name: [name] ZOXWR'UToday's Date: 02/23/2018 Reason for consult: Follow-up assessment;Difficult latch;Infant weight loss   Maternal Data Formula Feeding for Exclusion: No Has patient been taught Hand Expression?: Yes Does the patient have breastfeeding experience prior to this delivery?: Yes  Feeding Feeding Type: Breast Fed Length of feed: 40 min  LATCH Score Latch: Repeated attempts needed to sustain latch, nipple held in mouth throughout feeding, stimulation needed to elicit sucking reflex.(Sustains latch better with NS)  Audible Swallowing: Spontaneous and intermittent(but falls asleep quickly into BF)  Type of Nipple: Everted at rest and after stimulation  Comfort (Breast/Nipple): Filling, red/small blisters or bruises, mild/mod discomfort  Hold (Positioning): No assistance needed to correctly position infant at breast.  LATCH Score: 8  Interventions Interventions: Assisted with latch;Skin to skin;Breast massage;Hand express;Reverse pressure;Breast compression;Adjust position;Support pillows;Position options  Lactation Tools Discussed/Used Tools: Nipple Shields(Used NS on rt - got to latch to lt without NS, but less milk) Nipple shield size: 24 WIC Program: No([facility] & [employer])   Consult Status Consult Status: PRN Follow-up type: (Encouraged mom to come in for out pt wt check Monday)    Louis MeckelWilliams, Nayleah Gamel Kay 02/23/2018, 3:11 PM

## 2018-02-25 ENCOUNTER — Encounter: Payer: Self-pay | Admitting: Maternal Newborn

## 2018-02-25 ENCOUNTER — Ambulatory Visit (INDEPENDENT_AMBULATORY_CARE_PROVIDER_SITE_OTHER): Payer: 59 | Admitting: Maternal Newborn

## 2018-02-25 DIAGNOSIS — O165 Unspecified maternal hypertension, complicating the puerperium: Secondary | ICD-10-CM

## 2018-02-25 MED ORDER — LABETALOL HCL 200 MG PO TABS: 200.0000 mg | ORAL_TABLET | Freq: Two times a day (BID) | ORAL | 2 refills | Status: DC

## 2018-02-25 NOTE — Progress Notes (Signed)
Postpartum BP Check  S: Kimberly Mcfarland is here today for a blood pressure check. She feels well and has no symptoms except for fatigue. She denies headache, visual changes, epigastric pain, and shortness of breath/chest pain. She has trace edema BLE which is non-pitting.  O:  Vitals:   02/25/18 1008 02/25/18 1027  BP: (!) 156/104 (!) 156/110  Pulse: 60   Weight: 245 lb (111.1 kg)   Height: 5\' 3"  (1.6 m)     A:  BP was elevated on two consecutive readings in clinic today.  P: Start labetalol 200 mg BID and check BP at home. Will return to clinic for another blood pressure check at the end of this week. Aware of pre-eclampsia symptoms and to seek care if they develop.  Marcelyn BruinsJacelyn Schmid, CNM 02/25/2018  10:38 AM

## 2018-03-01 ENCOUNTER — Encounter: Payer: Self-pay | Admitting: Maternal Newborn

## 2018-03-01 ENCOUNTER — Ambulatory Visit (INDEPENDENT_AMBULATORY_CARE_PROVIDER_SITE_OTHER): Payer: 59 | Admitting: Maternal Newborn

## 2018-03-01 NOTE — Progress Notes (Signed)
Patient is here today to re-check blood pressure. She began taking labetalol 200 mg BID after her blood pressure was elevated on two readings at her visit last week. She is not tolerating the 200 mg dose well and began taking 100 mg BID yesterday.   Vitals today: Blood pressure 130/80, pulse 96, height 5\' 3"  (1.6 m), weight 239 lb (108.4 kg).  Advised to continue on 100 mg labetalol BID and to check blood pressure at home with any symptoms. She will return to care PRN if her blood pressures are elevated again with home measurements.  Discussed postpartum contraception. She is unable to get leave from work for a BTL and recovery. Considering ParaGard IUD. Literature given, and she is aware that she can schedule insertion during her postpartum visit if desired.  Return in 4 weeks for routine postpartum visit.  Marcelyn BruinsJacelyn Faiz Weber, CNM 03/01/2018  8:41 PM

## 2018-03-06 ENCOUNTER — Other Ambulatory Visit: Payer: Self-pay

## 2018-03-06 ENCOUNTER — Emergency Department
Admission: EM | Admit: 2018-03-06 | Discharge: 2018-03-06 | Disposition: A | Discharge status: Home / Self Care | Payer: 59 | Attending: Student in an Organized Health Care Education/Training Program | Admitting: Student in an Organized Health Care Education/Training Program

## 2018-03-06 ENCOUNTER — Emergency Department: Payer: 59

## 2018-03-06 ENCOUNTER — Encounter: Payer: Self-pay | Admitting: Emergency Medicine

## 2018-03-06 ENCOUNTER — Telehealth: Payer: Self-pay

## 2018-03-06 DIAGNOSIS — J45909 Unspecified asthma, uncomplicated: Secondary | ICD-10-CM | POA: Diagnosis not present

## 2018-03-06 DIAGNOSIS — Z79899 Other long term (current) drug therapy: Secondary | ICD-10-CM | POA: Diagnosis not present

## 2018-03-06 DIAGNOSIS — K8020 Calculus of gallbladder without cholecystitis without obstruction: Secondary | ICD-10-CM | POA: Insufficient documentation

## 2018-03-06 DIAGNOSIS — R1011 Right upper quadrant pain: Secondary | ICD-10-CM | POA: Diagnosis not present

## 2018-03-06 DIAGNOSIS — O9989 Other specified diseases and conditions complicating pregnancy, childbirth and the puerperium: Secondary | ICD-10-CM | POA: Diagnosis not present

## 2018-03-06 LAB — URINALYSIS, COMPLETE (UACMP) WITH MICROSCOPIC
Bilirubin Urine: NEGATIVE
Glucose, UA: NEGATIVE mg/dL
Ketones, ur: NEGATIVE mg/dL
Nitrite: NEGATIVE
Protein, ur: 100 mg/dL — AB
SPECIFIC GRAVITY, URINE: 1.028 (ref 1.005–1.030)
pH: 6 (ref 5.0–8.0)

## 2018-03-06 LAB — COMPREHENSIVE METABOLIC PANEL
ALBUMIN: 3.8 g/dL (ref 3.5–5.0)
ALT: 31 U/L (ref 14–54)
ANION GAP: 11 (ref 5–15)
AST: 28 U/L (ref 15–41)
Alkaline Phosphatase: 77 U/L (ref 38–126)
BILIRUBIN TOTAL: 0.5 mg/dL (ref 0.3–1.2)
BUN: 13 mg/dL (ref 6–20)
CALCIUM: 8.7 mg/dL — AB (ref 8.9–10.3)
CHLORIDE: 104 mmol/L (ref 101–111)
CO2: 22 mmol/L (ref 22–32)
Creatinine, Ser: 0.7 mg/dL (ref 0.44–1.00)
GFR calc Af Amer: >60 mL/min (ref >60)
Glucose, Bld: 119 mg/dL — ABNORMAL HIGH (ref 65–99)
POTASSIUM: 4 mmol/L (ref 3.5–5.1)
Sodium: 137 mmol/L (ref 135–145)
Total Protein: 7.4 g/dL (ref 6.5–8.1)

## 2018-03-06 LAB — CBC
HCT: 36.8 % (ref 35.0–47.0)
Hemoglobin: 12.2 g/dL (ref 12.0–16.0)
MCH: 28.8 pg (ref 26.0–34.0)
MCHC: 33.2 g/dL (ref 32.0–36.0)
MCV: 86.8 fL (ref 80.0–100.0)
PLATELETS: 408 10*3/uL (ref 150–440)
RBC: 4.24 MIL/uL (ref 3.80–5.20)
RDW: 14.8 % — AB (ref 11.5–14.5)
WBC: 14.7 10*3/uL — ABNORMAL HIGH (ref 3.6–11.0)

## 2018-03-06 LAB — LIPASE, BLOOD: LIPASE: 18 U/L (ref 11–51)

## 2018-03-06 MED ORDER — PROCHLORPERAZINE MALEATE 10 MG PO TABS: 10.0000 mg | ORAL_TABLET | Freq: Four times a day (QID) | ORAL | 0 refills | Status: DC | PRN

## 2018-03-06 NOTE — ED Triage Notes (Signed)
Pt here for epigastric/RUQ pain.  Pain radiated to back earlier this morning. Was worse after eating.  Had baby 2 weeks ago.  Tried zantac and simethicone.  NAD. VSS.  No NVD. No fever.

## 2018-03-06 NOTE — Discharge Instructions (Signed)

## 2018-03-06 NOTE — ED Notes (Signed)

## 2018-03-06 NOTE — Telephone Encounter (Signed)
FML[place]/DISABILITY for for [place] for [employer] [place] filled out. Signature obtained and given to [place] for processing.

## 2018-03-06 NOTE — ED Provider Notes (Signed)
[facility] Emergency Department Provider Note    None    (approximate)  I have reviewed the triage vital signs and the nursing notes.   HISTORY  Chief Complaint Abdominal Pain    HPI Kimberly Mcfarland is a 37 y.o. female recently 2 weeks postpartum presents with chief complaint of right upper quadrant abdominal pain.  States that she thought she was having a gallbladder attack.  States that this occurred shortly after eating.  Had severe pain to the mid epigastric region radiating up into her throat and bilateral shoulders.  Pain lasted several minutes associated with nausea.  No fevers.  Has had episodes similar to this in the past but not this long in duration.  Denies any measured fevers.  Denies any dysuria.  No vaginal discharge.  Is having steadily decreasing vaginal bleeding after her vaginal delivery.  Denies any breast pain or redness.  No chest pain or shortness of breath.  Past Medical History:  Diagnosis Date  . Asthma    has not used inhaler in years  . Breast mass x 1  month   about a 1 cm mass Left at 2 o'clock per pt  . Complication of anesthesia    difficulty voiding postop  . Family history of hemophilia 05/04/2014   patient has never been tested  . GERD (gastroesophageal reflux disease)   . Heart murmur    as child  . Hypothyroid    h/o outgrew  . PCOS (polycystic ovarian syndrome)    Family History  Problem Relation Age of Onset  . Diabetes Mother   . Asthma Mother   . Obesity Mother   . Hypertension Mother   . Hemophilia Mother   . Heart disease Father   . Heart attack Father   . Breast cancer Other   . Diabetes Maternal Grandmother        type 2  . Factor VIII deficiency Other        Also has factor 9 deficiency  . Factor VIII deficiency Other        also has factor 9 deficiency  . Hemophilia Maternal Grandfather    Past Surgical History:  Procedure Laterality Date  . CYST EXCISION  2010   head cysts x 14.  high  levels of keratin  . LESION EXCISION N/A 02/16/2016   Procedure: EXCISION SCALP LESION,excision 2 large scalp cysrts with intermediate closure;  Surgeon: Kieth Brightly, MD;  Location: [facility] ORS;  Service: General;  Laterality: N/A;  . WISDOM TOOTH EXTRACTION     Patient Active Problem List   Diagnosis Date Noted  . Postpartum care following vaginal delivery 02/20/2018  . Preeclampsia 02/17/2018  . Antepartum mild preeclampsia 02/13/2018  . Family history of hemophilia 02/13/2018  . Obesity in pregnancy 02/13/2018  . Elevated blood pressure affecting pregnancy in third trimester, antepartum 02/05/2018  . Palpitations 01/26/2018  . Advanced maternal age in multigravida 09/12/2017  . Supervision of high-risk pregnancy of elderly multigravida (>= 23 years old at time of delivery) 09/12/2017  . Hx of preeclampsia, prior pregnancy, currently pregnant 09/12/2017      Prior to Admission medications   Medication Sig Start Date End Date Taking? Authorizing Provider  acetaminophen (TYLENOL) 500 MG tablet Take 1,000 mg by mouth every 6 (six) hours as needed for headache.    [provider]  albuterol (PROVENTIL HFA;VENTOLIN HFA) 108 (90 BASE) MCG/ACT inhaler Inhale 2 puffs into the lungs every 4 (four) hours as needed  for wheezing or shortness of breath.    [provider]  calcium carbonate (TUMS - DOSED IN MG ELEMENTAL CALCIUM) 500 MG chewable tablet Chew 1 tablet by mouth as needed for indigestion or heartburn.    [provider]  Ferrous Sulfate (IRON) 325 (65 Fe) MG TABS Take 1 tablet (325 mg total) by mouth 2 (two) times daily. 02/20/18   Farrel ConnersGutierrez, Colleen, CNM  ibuprofen (ADVIL,MOTRIN) 600 MG tablet Take 1 tablet (600 mg total) by mouth every 6 (six) hours. 02/20/18   Farrel ConnersGutierrez, Colleen, CNM  labetalol (NORMODYNE) 200 MG tablet Take 1 tablet (200 mg total) by mouth 2 (two) times daily. 02/25/18   Oswaldo ConroySchmid, Jacelyn Y, CNM  Prenatal Vit-Fe Fumarate-FA (PRENATAL  MULTIVITAMIN) TABS tablet Take 1 tablet by mouth daily at 12 noon.    [provider]  prochlorperazine (COMPAZINE) 10 MG tablet Take 1 tablet (10 mg total) by mouth every 6 (six) hours as needed for nausea or vomiting. 03/06/18   Willy Eddyobinson, Zanasia Hickson, MD  ranitidine (ZANTAC) 150 MG tablet Take 150 mg by mouth 2 (two) times daily.    [provider]    Allergies Sulfa antibiotics; Levaquin [levofloxacin in d5w]; and Penicillins    Social History Social History   Tobacco Use  . Smoking status: Never Smoker  . Smokeless tobacco: Never Used  Substance Use Topics  . Alcohol use: No    Alcohol/week: 0.0 oz    Frequency: Never    Comment: wine occasionally/not while pregnant  . Drug use: No    Review of Systems Patient denies headaches, rhinorrhea, blurry vision, numbness, shortness of breath, chest pain, edema, cough, abdominal pain, nausea, vomiting, diarrhea, dysuria, fevers, rashes or hallucinations unless otherwise stated above in HPI. ____________________________________________   PHYSICAL EXAM:  VITAL SIGNS: Vitals:   03/06/18 1527  BP: 121/74  Pulse: 81  Resp: (!) 1  Temp: 99 F (37.2 C)  SpO2: 96%    Constitutional: Alert and oriented. Well appearing and in no acute distress. Eyes: Conjunctivae are normal.  Head: Atraumatic. Nose: No congestion/rhinnorhea. Mouth/Throat: Mucous membranes are moist.   Neck: No stridor. Painless ROM.  Cardiovascular: Normal rate, regular rhythm. Grossly normal heart sounds.  Good peripheral circulation. Respiratory: Normal respiratory effort.  No retractions. Lungs CTAB. Gastrointestinal: Soft and nontender. No distention. No abdominal bruits. No CVA tenderness. Genitourinary: deferred Musculoskeletal: No lower extremity tenderness nor edema.  No joint effusions. Neurologic:  Normal speech and language. No gross focal neurologic deficits are appreciated. No facial droop Skin:  Skin is warm, dry and intact. No rash  noted. Psychiatric: Mood and affect are normal. Speech and behavior are normal.  ____________________________________________   LABS (all labs ordered are listed, but only abnormal results are displayed)  Results for orders placed or performed during the hospital encounter of 03/06/18 (from the past 24 hour(s))  Lipase, blood     Status: None   Collection Time: 03/06/18  3:35 PM  Result Value Ref Range   Lipase 18 11 - 51 U/L  Comprehensive metabolic panel     Status: Abnormal   Collection Time: 03/06/18  3:35 PM  Result Value Ref Range   Sodium 137 135 - 145 mmol/L   Potassium 4.0 3.5 - 5.1 mmol/L   Chloride 104 101 - 111 mmol/L   CO2 22 22 - 32 mmol/L   Glucose, Bld 119 (H) 65 - 99 mg/dL   BUN 13 6 - 20 mg/dL   Creatinine, Ser 4.090.70 0.44 - 1.00 mg/dL  Calcium 8.7 (L) 8.9 - 10.3 mg/dL   Total Protein 7.4 6.5 - 8.1 g/dL   Albumin 3.8 3.5 - 5.0 g/dL   AST 28 15 - 41 U/L   ALT 31 14 - 54 U/L   Alkaline Phosphatase 77 38 - 126 U/L   Total Bilirubin 0.5 0.3 - 1.2 mg/dL   GFR calc non Af Amer >60 >60 mL/min   GFR calc Af Amer >60 >60 mL/min   Anion gap 11 5 - 15  CBC     Status: Abnormal   Collection Time: 03/06/18  3:35 PM  Result Value Ref Range   WBC 14.7 (H) 3.6 - 11.0 K/uL   RBC 4.24 3.80 - 5.20 MIL/uL   Hemoglobin 12.2 12.0 - 16.0 g/dL   HCT 69.6 29.5 - 28.4 %   MCV 86.8 80.0 - 100.0 fL   MCH 28.8 26.0 - 34.0 pg   MCHC 33.2 32.0 - 36.0 g/dL   RDW 13.2 (H) 44.0 - 10.2 %   Platelets 408 150 - 440 K/uL  Urinalysis, Complete w Microscopic     Status: Abnormal   Collection Time: 03/06/18  3:35 PM  Result Value Ref Range   Color, Urine AMBER (A) YELLOW   APPearance CLOUDY (A) CLEAR   Specific Gravity, Urine 1.028 1.005 - 1.030   pH 6.0 5.0 - 8.0   Glucose, UA NEGATIVE NEGATIVE mg/dL   Hgb urine dipstick LARGE (A) NEGATIVE   Bilirubin Urine NEGATIVE NEGATIVE   Ketones, ur NEGATIVE NEGATIVE mg/dL   Protein, ur 725 (A) NEGATIVE mg/dL   Nitrite NEGATIVE NEGATIVE    Leukocytes, UA MODERATE (A) NEGATIVE   RBC / HPF TOO NUMEROUS TO COUNT 0 - 5 RBC/hpf   WBC, UA 6-30 0 - 5 WBC/hpf   Bacteria, UA RARE (A) NONE SEEN   Squamous Epithelial / LPF 0-5 (A) NONE SEEN   Mucus PRESENT    Amorphous Crystal PRESENT    Non Squamous Epithelial 0-5 (A) NONE SEEN   ____________________________________________  EKG My review and personal interpretation at Time: 15:35   Indication: chest pain  Rate: 80  Rhythm: sinus Axis: normal  Other: normal intervals, no stemi, no preexcitation syndrome ____________________________________________  RADIOLOGY  I personally reviewed all radiographic images ordered to evaluate for the above acute complaints and reviewed radiology reports and findings.  These findings were personally discussed with the patient.  Please see medical record for radiology report.  ____________________________________________   PROCEDURES  Procedure(s) performed:  Procedures    Critical Care performed: no ____________________________________________   INITIAL IMPRESSION / ASSESSMENT AND PLAN / ED COURSE  Pertinent labs & imaging results that were available during my care of the patient were reviewed by me and considered in my medical decision making (see chart for details).  DDX: Biliary colic, cholelithiasis, cholecystitis, hepatitis, gastritis, enteritis  Kimberly Mcfarland is a 37 y.o. who presents to the ED with symptoms as described above.  Has mild leukocytosis but is afebrile and well-appearing.  Abdominal exam is soft and benign but based on recent pregnancy and presentation right upper quadrant ultrasound ordered to evaluate for cholecystitis and the above differential.  There is no evidence of cholecystitis but does have gallstones.  Symptoms most consistent with biliary colic.  Patient will be referred to outpatient general surgery as patient has no evidence of biliary obstruction at this time.  Discussed strict return precautions and  signs and symptoms for which she should return to the ER.  Have discussed with  the patient and available family all diagnostics and treatments performed thus far and all questions were answered to the best of my ability. The patient demonstrates understanding and agreement with plan.       ____________________________________________   FINAL CLINICAL IMPRESSION(S) / ED DIAGNOSES  Final diagnoses:  RUQ pain  Calculus of gallbladder without cholecystitis without obstruction      NEW MEDICATIONS STARTED DURING THIS VISIT:  New Prescriptions   PROCHLORPERAZINE (COMPAZINE) 10 MG TABLET    Take 1 tablet (10 mg total) by mouth every 6 (six) hours as needed for nausea or vomiting.     Note:  This document was prepared using Dragon voice recognition software and may include unintentional dictation errors.    Willy Eddy, MD 03/06/18 [phone]

## 2018-03-07 ENCOUNTER — Other Ambulatory Visit: Payer: Self-pay

## 2018-03-07 ENCOUNTER — Observation Stay
Admission: EM | Admit: 2018-03-07 | Discharge: 2018-03-09 | Disposition: A | Discharge status: Home / Self Care | Payer: 59 | Attending: Surgery | Admitting: Surgery

## 2018-03-07 ENCOUNTER — Emergency Department: Payer: 59

## 2018-03-07 ENCOUNTER — Encounter: Payer: Self-pay | Admitting: Emergency Medicine

## 2018-03-07 DIAGNOSIS — Z8249 Family history of ischemic heart disease and other diseases of the circulatory system: Secondary | ICD-10-CM | POA: Insufficient documentation

## 2018-03-07 DIAGNOSIS — R911 Solitary pulmonary nodule: Secondary | ICD-10-CM | POA: Diagnosis not present

## 2018-03-07 DIAGNOSIS — O99215 Obesity complicating the puerperium: Secondary | ICD-10-CM | POA: Insufficient documentation

## 2018-03-07 DIAGNOSIS — O165 Unspecified maternal hypertension, complicating the puerperium: Secondary | ICD-10-CM | POA: Diagnosis not present

## 2018-03-07 DIAGNOSIS — K219 Gastro-esophageal reflux disease without esophagitis: Secondary | ICD-10-CM | POA: Diagnosis not present

## 2018-03-07 DIAGNOSIS — O26893 Other specified pregnancy related conditions, third trimester: Secondary | ICD-10-CM | POA: Diagnosis not present

## 2018-03-07 DIAGNOSIS — O9953 Diseases of the respiratory system complicating the puerperium: Secondary | ICD-10-CM | POA: Insufficient documentation

## 2018-03-07 DIAGNOSIS — R079 Chest pain, unspecified: Secondary | ICD-10-CM | POA: Diagnosis not present

## 2018-03-07 DIAGNOSIS — K8010 Calculus of gallbladder with chronic cholecystitis without obstruction: Secondary | ICD-10-CM | POA: Diagnosis not present

## 2018-03-07 DIAGNOSIS — O99285 Endocrine, nutritional and metabolic diseases complicating the puerperium: Secondary | ICD-10-CM | POA: Diagnosis not present

## 2018-03-07 DIAGNOSIS — E282 Polycystic ovarian syndrome: Secondary | ICD-10-CM | POA: Insufficient documentation

## 2018-03-07 DIAGNOSIS — Z791 Long term (current) use of non-steroidal anti-inflammatories (NSAID): Secondary | ICD-10-CM | POA: Insufficient documentation

## 2018-03-07 DIAGNOSIS — Z79899 Other long term (current) drug therapy: Secondary | ICD-10-CM | POA: Diagnosis not present

## 2018-03-07 DIAGNOSIS — R1013 Epigastric pain: Secondary | ICD-10-CM | POA: Insufficient documentation

## 2018-03-07 DIAGNOSIS — O9963 Diseases of the digestive system complicating the puerperium: Secondary | ICD-10-CM | POA: Diagnosis not present

## 2018-03-07 DIAGNOSIS — J45909 Unspecified asthma, uncomplicated: Secondary | ICD-10-CM | POA: Diagnosis not present

## 2018-03-07 DIAGNOSIS — K8000 Calculus of gallbladder with acute cholecystitis without obstruction: Secondary | ICD-10-CM

## 2018-03-07 DIAGNOSIS — O9081 Anemia of the puerperium: Secondary | ICD-10-CM | POA: Diagnosis not present

## 2018-03-07 DIAGNOSIS — I10 Essential (primary) hypertension: Secondary | ICD-10-CM | POA: Diagnosis not present

## 2018-03-07 DIAGNOSIS — R9431 Abnormal electrocardiogram [ECG] [EKG]: Secondary | ICD-10-CM | POA: Diagnosis not present

## 2018-03-07 DIAGNOSIS — R109 Unspecified abdominal pain: Secondary | ICD-10-CM | POA: Diagnosis present

## 2018-03-07 DIAGNOSIS — I249 Acute ischemic heart disease, unspecified: Secondary | ICD-10-CM | POA: Diagnosis not present

## 2018-03-07 DIAGNOSIS — K8020 Calculus of gallbladder without cholecystitis without obstruction: Secondary | ICD-10-CM | POA: Diagnosis not present

## 2018-03-07 LAB — COMPREHENSIVE METABOLIC PANEL
ALBUMIN: 3.5 g/dL (ref 3.5–5.0)
ALK PHOS: 70 U/L (ref 38–126)
ALT: 26 U/L (ref 14–54)
AST: 23 U/L (ref 15–41)
Anion gap: 11 (ref 5–15)
BILIRUBIN TOTAL: 0.6 mg/dL (ref 0.3–1.2)
BUN: 14 mg/dL (ref 6–20)
CALCIUM: 8.3 mg/dL — AB (ref 8.9–10.3)
CO2: 21 mmol/L — AB (ref 22–32)
Chloride: 106 mmol/L (ref 101–111)
Creatinine, Ser: 0.75 mg/dL (ref 0.44–1.00)
GFR calc Af Amer: >60 mL/min (ref >60)
GFR calc non Af Amer: >60 mL/min (ref >60)
GLUCOSE: 132 mg/dL — AB (ref 65–99)
Potassium: 3.7 mmol/L (ref 3.5–5.1)
SODIUM: 138 mmol/L (ref 135–145)
TOTAL PROTEIN: 7 g/dL (ref 6.5–8.1)

## 2018-03-07 LAB — APTT: APTT: 35 s (ref 24–36)

## 2018-03-07 LAB — URINALYSIS, COMPLETE (UACMP) WITH MICROSCOPIC
BACTERIA UA: NONE SEEN
Bilirubin Urine: NEGATIVE
GLUCOSE, UA: NEGATIVE mg/dL
Ketones, ur: 5 mg/dL — AB
NITRITE: NEGATIVE
PROTEIN: 30 mg/dL — AB
SPECIFIC GRAVITY, URINE: 1.03 (ref 1.005–1.030)
pH: 5 (ref 5.0–8.0)

## 2018-03-07 LAB — CBC
HCT: 37.1 % (ref 35.0–47.0)
HEMOGLOBIN: 12.2 g/dL (ref 12.0–16.0)
MCH: 28.4 pg (ref 26.0–34.0)
MCHC: 32.8 g/dL (ref 32.0–36.0)
MCV: 86.8 fL (ref 80.0–100.0)
Platelets: 422 10*3/uL (ref 150–440)
RBC: 4.27 MIL/uL (ref 3.80–5.20)
RDW: 14.7 % — AB (ref 11.5–14.5)
WBC: 11.8 10*3/uL — ABNORMAL HIGH (ref 3.6–11.0)

## 2018-03-07 LAB — PROTIME-INR
INR: 1.05
PROTHROMBIN TIME: 13.6 s (ref 11.4–15.2)

## 2018-03-07 LAB — POCT PREGNANCY, URINE: Preg Test, Ur: NEGATIVE

## 2018-03-07 LAB — TROPONIN I: TROPONIN I: 0.03 ng/mL — AB (ref <0.03)

## 2018-03-07 LAB — LIPASE, BLOOD: Lipase: 18 U/L (ref 11–51)

## 2018-03-07 MED ORDER — ALBUTEROL SULFATE (2.5 MG/3ML) 0.083% IN NEBU: 2.5000 mg | INHALATION_SOLUTION | RESPIRATORY_TRACT | Status: DC | PRN

## 2018-03-07 MED ORDER — PANTOPRAZOLE SODIUM 40 MG PO TBEC
40.0000 mg | DELAYED_RELEASE_TABLET | Freq: Two times a day (BID) | ORAL | Status: DC
  Administered 2018-03-07 – 2018-03-09 (×3): 40 mg via ORAL
  Filled 2018-03-07 (×3): qty 1

## 2018-03-07 MED ORDER — SODIUM CHLORIDE 0.9 % IV BOLUS (SEPSIS)
1000.0000 mL | Freq: Once | INTRAVENOUS | Status: AC
  Administered 2018-03-07: 1000 mL via INTRAVENOUS

## 2018-03-07 MED ORDER — LIDOCAINE VISCOUS 2 % MT SOLN
15.0000 mL | Freq: Once | OROMUCOSAL | Status: AC
  Administered 2018-03-07: 15 mL via OROMUCOSAL
  Filled 2018-03-07: qty 15

## 2018-03-07 MED ORDER — ACETAMINOPHEN 325 MG PO TABS: 650.0000 mg | ORAL_TABLET | Freq: Four times a day (QID) | ORAL | Status: DC | PRN

## 2018-03-07 MED ORDER — FERROUS SULFATE 325 (65 FE) MG PO TABS
325.0000 mg | ORAL_TABLET | Freq: Two times a day (BID) | ORAL | Status: DC
  Filled 2018-03-07 (×2): qty 1

## 2018-03-07 MED ORDER — ONDANSETRON HCL 4 MG/2ML IJ SOLN: 4.0000 mg | Freq: Four times a day (QID) | INTRAMUSCULAR | Status: DC | PRN

## 2018-03-07 MED ORDER — LABETALOL HCL 100 MG PO TABS
100.0000 mg | ORAL_TABLET | Freq: Two times a day (BID) | ORAL | Status: DC
  Administered 2018-03-07 – 2018-03-08 (×3): 100 mg via ORAL
  Filled 2018-03-07 (×4): qty 1

## 2018-03-07 MED ORDER — HEPARIN BOLUS VIA INFUSION
4000.0000 [IU] | Freq: Once | INTRAVENOUS | Status: AC
  Administered 2018-03-07: 4000 [IU] via INTRAVENOUS
  Filled 2018-03-07: qty 4000

## 2018-03-07 MED ORDER — HEPARIN (PORCINE) IN NACL 100-0.45 UNIT/ML-% IJ SOLN
1050.0000 [IU]/h | INTRAMUSCULAR | Status: DC
  Administered 2018-03-07: 900 [IU]/h via INTRAVENOUS
  Filled 2018-03-07 (×2): qty 250

## 2018-03-07 MED ORDER — PRENATAL MULTIVITAMIN CH
1.0000 | ORAL_TABLET | Freq: Every day | ORAL | Status: DC
Start: 2018-03-08 — End: 2018-03-09
  Filled 2018-03-07 (×2): qty 1

## 2018-03-07 MED ORDER — POLYETHYLENE GLYCOL 3350 17 G PO PACK
17.0000 g | PACK | Freq: Every day | ORAL | Status: DC | PRN
  Filled 2018-03-07: qty 1

## 2018-03-07 MED ORDER — OXYCODONE HCL 5 MG PO TABS: 5.0000 mg | ORAL_TABLET | ORAL | Status: DC | PRN

## 2018-03-07 MED ORDER — ASPIRIN EC 81 MG PO TBEC
81.0000 mg | DELAYED_RELEASE_TABLET | Freq: Every day | ORAL | Status: DC
Start: 2018-03-08 — End: 2018-03-09
  Administered 2018-03-09: 81 mg via ORAL
  Filled 2018-03-07 (×2): qty 1

## 2018-03-07 MED ORDER — ACETAMINOPHEN 650 MG RE SUPP
650.0000 mg | Freq: Four times a day (QID) | RECTAL | Status: DC | PRN
  Filled 2018-03-07: qty 1

## 2018-03-07 MED ORDER — ALUM & MAG HYDROXIDE-SIMETH 200-200-20 MG/5ML PO SUSP
30.0000 mL | Freq: Once | ORAL | Status: AC
  Administered 2018-03-07: 30 mL via ORAL
  Filled 2018-03-07 (×2): qty 30

## 2018-03-07 MED ORDER — ENOXAPARIN SODIUM 40 MG/0.4ML ~~LOC~~ SOLN
40.0000 mg | SUBCUTANEOUS | Status: DC
  Filled 2018-03-07: qty 0.4

## 2018-03-07 MED ORDER — IOPAMIDOL (ISOVUE-370) INJECTION 76%
75.0000 mL | Freq: Once | INTRAVENOUS | Status: AC | PRN
  Administered 2018-03-07: 75 mL via INTRAVENOUS
  Filled 2018-03-07: qty 75

## 2018-03-07 MED ORDER — ONDANSETRON HCL 4 MG PO TABS
4.0000 mg | ORAL_TABLET | Freq: Four times a day (QID) | ORAL | Status: DC | PRN
  Filled 2018-03-07: qty 1

## 2018-03-07 MED ORDER — ASPIRIN 81 MG PO CHEW
324.0000 mg | CHEWABLE_TABLET | Freq: Once | ORAL | Status: AC
  Administered 2018-03-07: 324 mg via ORAL
  Filled 2018-03-07: qty 4

## 2018-03-07 MED ORDER — NITROGLYCERIN 0.4 MG SL SUBL
0.4000 mg | SUBLINGUAL_TABLET | SUBLINGUAL | Status: DC | PRN
Start: 2018-03-07 — End: 2018-03-09
  Filled 2018-03-07: qty 1

## 2018-03-07 NOTE — H&P (Signed)
[facility] - [place] [facility]   PATIENT NAME: Kimberly Mcfarland    MR#:  [id number]  DATE OF BIRTH:  July 20, 1981  DATE OF ADMISSION:  03/07/2018  PRIMARY CARE PHYSICIAN: Patient, No Pcp Per   REQUESTING/REFERRING PHYSICIAN: Dr. Raynelle Chary  CHIEF COMPLAINT:   Chief Complaint  Patient presents with  . Abdominal Pain    HISTORY OF PRESENT ILLNESS:  Kimberly Mcfarland  is a 37 y.o. female with a known history of PCO S, preeclampsia, 2-1/2 weeks postpartum from vaginal delivery presents to the emergency room due to abdominal and chest pain radiating to the back.  Patient was seen for similar pain yesterday and had ultrasound of the gallbladder which showed gallstones but no cholecystitis.  She was sent home with surgery referral.  Pain worsened and continues to have pain and patient returned.  Today her EKG shows changes of T wave inversions in lead II and aVL.  Troponin 0.03.  Case was discussed with cardiology Dr. Lourena Simmonds who advised heparin drip and cardiac catheterization.  Patient is on labetalol for elevated blood pressure.  PAST MEDICAL HISTORY:   Past Medical History:  Diagnosis Date  . Asthma    has not used inhaler in years  . Breast mass x 1  month   about a 1 cm mass Left at 2 o'clock per pt  . Complication of anesthesia    difficulty voiding postop  . Family history of hemophilia 05/04/2014   patient has never been tested  . GERD (gastroesophageal reflux disease)   . Heart murmur    as child  . Hypothyroid    h/o outgrew  . PCOS (polycystic ovarian syndrome)     PAST SURGICAL HISTORY:   Past Surgical History:  Procedure Laterality Date  . CYST EXCISION  2010   head cysts x 14.  high levels of keratin  . LESION EXCISION N/A 02/16/2016   Procedure: EXCISION SCALP LESION,excision 2 large scalp cysrts with intermediate closure;  Surgeon: Kieth Brightly, MD;  Location: [facility] ORS;  Service: General;  Laterality: N/A;  . WISDOM TOOTH EXTRACTION       SOCIAL HISTORY:   Social History   Tobacco Use  . Smoking status: Never Smoker  . Smokeless tobacco: Never Used  Substance Use Topics  . Alcohol use: No    Alcohol/week: 0.0 oz    Frequency: Never    Comment: wine occasionally/not while pregnant    FAMILY HISTORY:   Family History  Problem Relation Age of Onset  . Diabetes Mother   . Asthma Mother   . Obesity Mother   . Hypertension Mother   . Hemophilia Mother   . Heart disease Father   . Heart attack Father   . Breast cancer Other   . Diabetes Maternal Grandmother        type 2  . Factor VIII deficiency Other        Also has factor 9 deficiency  . Factor VIII deficiency Other        also has factor 9 deficiency  . Hemophilia Maternal Grandfather     DRUG ALLERGIES:   Allergies  Allergen Reactions  . Sulfa Antibiotics Other (See Comments)    seizures  . Levaquin [Levofloxacin In D5w] Hives  . Penicillins Hives    REVIEW OF SYSTEMS:   Review of Systems  Constitutional: Positive for malaise/fatigue. Negative for chills and fever.  HENT: Negative for sore throat.   Eyes: Negative for blurred vision, double vision and  pain.  Respiratory: Negative for cough, hemoptysis, shortness of breath and wheezing.   Cardiovascular: Positive for chest pain. Negative for palpitations, orthopnea and leg swelling.  Gastrointestinal: Positive for abdominal pain and nausea. Negative for constipation, diarrhea, heartburn and vomiting.  Genitourinary: Negative for dysuria and hematuria.  Musculoskeletal: Negative for back pain and joint pain.  Skin: Negative for rash.  Neurological: Positive for weakness. Negative for sensory change, speech change, focal weakness and headaches.  Endo/Heme/Allergies: Does not bruise/bleed easily.  Psychiatric/Behavioral: Negative for depression. The patient is not nervous/anxious.     MEDICATIONS AT HOME:   Prior to Admission medications   Medication Sig Start Date End Date Taking?  Authorizing Provider  acetaminophen (TYLENOL) 500 MG tablet Take 1,000 mg by mouth every 6 (six) hours as needed for headache.    [provider]  albuterol (PROVENTIL HFA;VENTOLIN HFA) 108 (90 BASE) MCG/ACT inhaler Inhale 2 puffs into the lungs every 4 (four) hours as needed for wheezing or shortness of breath.    [provider]  calcium carbonate (TUMS - DOSED IN MG ELEMENTAL CALCIUM) 500 MG chewable tablet Chew 1 tablet by mouth as needed for indigestion or heartburn.    [provider]  Ferrous Sulfate (IRON) 325 (65 Fe) MG TABS Take 1 tablet (325 mg total) by mouth 2 (two) times daily. 02/20/18   Farrel Conners, CNM  ibuprofen (ADVIL,MOTRIN) 600 MG tablet Take 1 tablet (600 mg total) by mouth every 6 (six) hours. 02/20/18   Farrel Conners, CNM  labetalol (NORMODYNE) 200 MG tablet Take 1 tablet (200 mg total) by mouth 2 (two) times daily. 02/25/18   Oswaldo Conroy, CNM  Prenatal Vit-Fe Fumarate-FA (PRENATAL MULTIVITAMIN) TABS tablet Take 1 tablet by mouth daily at 12 noon.    [provider]  prochlorperazine (COMPAZINE) 10 MG tablet Take 1 tablet (10 mg total) by mouth every 6 (six) hours as needed for nausea or vomiting. 03/06/18   Willy Eddy, MD  ranitidine (ZANTAC) 150 MG tablet Take 150 mg by mouth 2 (two) times daily.    [provider]     VITAL SIGNS:  Blood pressure (!) 130/91, pulse 91, temperature 98.5 F (36.9 C), temperature source Oral, resp. rate 16, height 5\' 3"  (1.6 m), weight 106.1 kg (234 lb), SpO2 97 %, unknown if currently breastfeeding.  PHYSICAL EXAMINATION:  Physical Exam  GENERAL:  37 y.o.-year-old patient lying in the bed with no acute distress.  Obese EYES: Pupils equal, round, reactive to light and accommodation. No scleral icterus. Extraocular muscles intact.  HEENT: Head atraumatic, normocephalic. Oropharynx and nasopharynx clear. No oropharyngeal erythema, moist oral mucosa  NECK:  Supple, no jugular  venous distention. No thyroid enlargement, no tenderness.  LUNGS: Normal breath sounds bilaterally, no wheezing, rales, rhonchi. No use of accessory muscles of respiration.  CARDIOVASCULAR: S1, S2 normal. No murmurs, rubs, or gallops.  ABDOMEN: Soft, nontender, nondistended. Bowel sounds present. No organomegaly or mass.  EXTREMITIES: No pedal edema, cyanosis, or clubbing. + 2 pedal & radial pulses b/l.   NEUROLOGIC: Cranial nerves II through XII are intact. No focal Motor or sensory deficits appreciated b/l PSYCHIATRIC: The patient is alert and oriented x 3. Good affect.  SKIN: No obvious rash, lesion, or ulcer.   LABORATORY PANEL:   CBC Recent Labs  Lab 03/07/18 1540  WBC 11.8*  HGB 12.2  HCT 37.1  PLT 422   ------------------------------------------------------------------------------------------------------------------  Chemistries  Recent Labs  Lab 03/07/18 1540  NA 138  K 3.7  CL 106  CO2 21*  GLUCOSE 132*  BUN 14  CREATININE 0.75  CALCIUM 8.3*  AST 23  ALT 26  ALKPHOS 70  BILITOT 0.6   ------------------------------------------------------------------------------------------------------------------  Cardiac Enzymes Recent Labs  Lab 03/07/18 1538  TROPONINI 0.03*   ------------------------------------------------------------------------------------------------------------------  RADIOLOGY:  Dg Chest 2 View  Result Date: 03/07/2018 CLINICAL DATA:  Chest pain EXAM: CHEST - 2 VIEW COMPARISON:  01/29/2016 FINDINGS: The heart size and mediastinal contours are within normal limits. Both lungs are clear. The visualized skeletal structures are unremarkable. IMPRESSION: No active cardiopulmonary disease. Electronically Signed   By: Kennith Center M.D.   On: 03/07/2018 17:23   Ct Angio Chest Pe W And/or Wo Contrast  Result Date: 03/07/2018 CLINICAL DATA:  2 weeks postpartum. High pretest probability for pulmonary embolus. EXAM: CT ANGIOGRAPHY CHEST WITH CONTRAST  TECHNIQUE: Multidetector CT imaging of the chest was performed using the standard protocol during bolus administration of intravenous contrast. Multiplanar CT image reconstructions and MIPs were obtained to evaluate the vascular anatomy. CONTRAST:  75mL ISOVUE-370 IOPAMIDOL (ISOVUE-370) INJECTION 76% COMPARISON:  None. FINDINGS: Cardiovascular: The heart size is normal. No pericardial effusion. No thoracic aortic aneurysm. No dissection of the thoracic aorta. No evidence for filling defect in the opacified pulmonary arteries to suggest the presence of an acute pulmonary embolus. Mediastinum/Nodes: No mediastinal lymphadenopathy. There is no hilar lymphadenopathy. The esophagus has normal imaging features. There is no axillary lymphadenopathy. Lungs/Pleura: 5 mm a triangle-shaped nodule along the minor fissure (image 47 series 7) is compatible with subpleural lymph node. 7 mm pulmonary nodule identified right lung (image 40/7). No pulmonary edema. No focal airspace consolidation. No pleural effusion. Upper Abdomen: Unremarkable. Musculoskeletal: Bone windows reveal no worrisome lytic or sclerotic osseous lesions. Review of the MIP images confirms the above findings. IMPRESSION: 1. No CT evidence for acute pulmonary embolus. 2. 7 mm right lower lobe pulmonary nodule. Non-contrast chest CT at 6-12 months is recommended. If the nodule is stable at time of repeat CT, then future CT at 18-24 months (from today's scan) is considered optional for low-risk patients, but is recommended for high-risk patients. This recommendation follows the consensus statement: Guidelines for Management of Incidental Pulmonary Nodules Detected on CT Images: From the Fleischner Society 2017; Radiology 2017; 284:228-243. Electronically Signed   By: Kennith Center M.D.   On: 03/07/2018 18:37   US Abdomen Limited Ruq  Result Date: 03/06/2018 CLINICAL DATA:  37 y/o F; epigastric and right upper quadrant abdominal pain for 1 day. 2-1/2 weeks  postpartum. EXAM: ULTRASOUND ABDOMEN LIMITED RIGHT UPPER QUADRANT COMPARISON:  None. FINDINGS: Gallbladder: Multiple mobile gallstones measuring up to 6.3 mm. No gallbladder wall thickening or pericholecystic fluid. Negative sonographic Murphy's sign. Common bile duct: Diameter: 3 mm Liver: No focal lesion identified. Within normal limits in parenchymal echogenicity. Portal vein is patent on color Doppler imaging with normal direction of blood flow towards the liver. IMPRESSION: Cholelithiasis.  No secondary signs of acute cholecystitis. Electronically Signed   By: Mitzi Hansen M.D.   On: 03/06/2018 18:02   IMPRESSION AND PLAN:   * Chest pain with EKG changes Post partum cardiomyopathy? Start heparin drip per cardiology Cardiac cath in AM Ordered echocardiogram ASA, BB Repeat troponin. Telemetry floor.  * HTN Labetalol  * Gallstones on [place] Has OP referral to surgery department  All the records are reviewed and case discussed with ED provider. Management plans discussed with the patient, family and they are in agreement.  CODE STATUS: FULL CODE  TOTAL  TIME TAKING CARE OF THIS PATIENT: 40 minutes.   Orie FishermanSrikar R Kinnedy Mongiello M.D on 03/07/2018 at 6:46 PM  Between 7am to 6pm - Pager - [phone]  After 6pm go to [email or web address] - password [email or web address]  [facility]  Office  [phone]  CC: Primary care physician; Patient, No Pcp Per  Note: This dictation was prepared with Dragon dictation along with smaller phrase technology. Any transcriptional errors that result from this process are unintentional.

## 2018-03-07 NOTE — ED Notes (Signed)
Kimberly Mcfarland with lab called with troponin result 0.03 and Kimberly Mcfarland was called in C-pod and given result as well

## 2018-03-07 NOTE — Consult Note (Addendum)
Cardiology Consultation:   Patient ID: Kimberly Mcfarland; [id number]; 22-Jan-1981   Admit date: 03/07/2018 Date of Consult: 03/07/2018  Primary Care Provider: Patient, No Pcp Per Primary Cardiologist: Analena Gama  Primary Electrophysiologist:  None    Patient Profile:   Kimberly Mcfarland is a 37 y.o. female with a hx of  Recent spontansous delivery 2 weeks ago  who is being seen today for the evaluation of  Chest pain , indigestion , abn. ECG and minimally abnormal Troponin  at the request of  Dr. Pershing Proud and Dr. Elpidio Anis     History of Present Illness:   Kimberly Mcfarland is a 37 year old female with a history of polycystic ovarian syndrome.  She recently had a pregnancy and developed preeclampsia.  She was induced she was induced several weeks early because of his preeclampsia.  Of note is that she had preeclampsia with her other deliveries as well.  The delivery otherwise went fairly well.  Is not had any recurrent problems.  Yesterday she started developing abdominal pain which did not feel like any other previous episodes of gas-like pain.  It tended to radiate up into her chest with radiation straight through to her back.  It was not relieved by passing gas or taking antacids.  It was not relieved by belching.  It did not particularly worsened with exertion/walking.  There was some radiation to the right shoulder.  The pain as a occurred off and on since that time but around midnight last night it resolved and she was able to get some sleep.  The pain returned today she presented back to the emergency room.  She has not had any nausea vomiting or diarrhea.  She denies any blood in her stool.  She denies any previous episodes of chest pain.  She does not get a lot of exercise.  She denies any syncope or presyncope.  She denies any PND orthopnea.  She admits to eating some extra salt on occasion.  She was started on labetalol last week for persistent hypertension.  Her blood pressure has been in the  120-150 range at home on labetalol.  She presented to the emergency room.  A CT angiogram of the chest with was negative for pulmonary embolus.  By the time I arrived, she was pain-free and feeling quite well.  Past Medical History:  Diagnosis Date  . Asthma    has not used inhaler in years  . Breast mass x 1  month   about a 1 cm mass Left at 2 o'clock per pt  . Complication of anesthesia    difficulty voiding postop  . Family history of hemophilia 05/04/2014   patient has never been tested  . GERD (gastroesophageal reflux disease)   . Heart murmur    as child  . Hypothyroid    h/o outgrew  . PCOS (polycystic ovarian syndrome)     Past Surgical History:  Procedure Laterality Date  . CYST EXCISION  2010   head cysts x 14.  high levels of keratin  . LESION EXCISION N/A 02/16/2016   Procedure: EXCISION SCALP LESION,excision 2 large scalp cysrts with intermediate closure;  Surgeon: Kieth Brightly, MD;  Location: [facility] ORS;  Service: General;  Laterality: N/A;  . WISDOM TOOTH EXTRACTION       Home Medications:  Prior to Admission medications   Medication Sig Start Date End Date Taking? Authorizing Provider  acetaminophen (TYLENOL) 500 MG tablet Take 1,000 mg by mouth every 6 (six) hours as needed  for headache.    [provider]  albuterol (PROVENTIL HFA;VENTOLIN HFA) 108 (90 BASE) MCG/ACT inhaler Inhale 2 puffs into the lungs every 4 (four) hours as needed for wheezing or shortness of breath.    [provider]  calcium carbonate (TUMS - DOSED IN MG ELEMENTAL CALCIUM) 500 MG chewable tablet Chew 1 tablet by mouth as needed for indigestion or heartburn.    [provider]  Ferrous Sulfate (IRON) 325 (65 Fe) MG TABS Take 1 tablet (325 mg total) by mouth 2 (two) times daily. 02/20/18   Farrel Conners, CNM  ibuprofen (ADVIL,MOTRIN) 600 MG tablet Take 1 tablet (600 mg total) by mouth every 6 (six) hours. 02/20/18   Farrel Conners, CNM  labetalol  (NORMODYNE) 200 MG tablet Take 1 tablet (200 mg total) by mouth 2 (two) times daily. 02/25/18   Oswaldo Conroy, CNM  Prenatal Vit-Fe Fumarate-FA (PRENATAL MULTIVITAMIN) TABS tablet Take 1 tablet by mouth daily at 12 noon.    [provider]  prochlorperazine (COMPAZINE) 10 MG tablet Take 1 tablet (10 mg total) by mouth every 6 (six) hours as needed for nausea or vomiting. 03/06/18   Willy Eddy, MD  ranitidine (ZANTAC) 150 MG tablet Take 150 mg by mouth 2 (two) times daily.    [provider]    Inpatient Medications: Scheduled Meds: . aspirin  324 mg Oral Once  . enoxaparin (LOVENOX) injection  40 mg Subcutaneous Q24H  . pantoprazole  40 mg Oral BID AC   Continuous Infusions:  PRN Meds: acetaminophen **OR** acetaminophen, albuterol, nitroGLYCERIN, ondansetron **OR** ondansetron (ZOFRAN) IV, oxyCODONE, polyethylene glycol  Allergies:    Allergies  Allergen Reactions  . Sulfa Antibiotics Other (See Comments)    seizures  . Levaquin [Levofloxacin In D5w] Hives  . Penicillins Hives    Social History:   Social History   Socioeconomic History  . Marital status: Married    Spouse name: Not on file  . Number of children: 1  . Years of education: Not on file  . Highest education level: Not on file  Social Needs  . Financial resource strain: Not on file  . Food insecurity - worry: Not on file  . Food insecurity - inability: Not on file  . Transportation needs - medical: Not on file  . Transportation needs - non-medical: Not on file  Occupational History  . Not on file  Tobacco Use  . Smoking status: Never Smoker  . Smokeless tobacco: Never Used  Substance and Sexual Activity  . Alcohol use: No    Alcohol/week: 0.0 oz    Frequency: Never    Comment: wine occasionally/not while pregnant  . Drug use: No  . Sexual activity: No    Birth control/protection: None  Other Topics Concern  . Not on file  Social History Narrative  . Not on file    Family  History:    Family History  Problem Relation Age of Onset  . Diabetes Mother   . Asthma Mother   . Obesity Mother   . Hypertension Mother   . Hemophilia Mother   . Heart disease Father   . Heart attack Father   . Breast cancer Other   . Diabetes Maternal Grandmother        type 2  . Factor VIII deficiency Other        Also has factor 9 deficiency  . Factor VIII deficiency Other        also has factor 9 deficiency  .  Hemophilia Maternal Grandfather      ROS:  Please see the history of present illness.   All other ROS reviewed and negative.     Physical Exam/Data:   Vitals:   03/07/18 1527 03/07/18 1538 03/07/18 1815  BP: 115/87  (!) 130/91  Pulse: 90  91  Resp: 16  16  Temp: 98.5 F (36.9 C)    TempSrc: Oral    SpO2: 98%  97%  Weight:  234 lb (106.1 kg)   Height:  5\' 3"  (1.6 m)    No intake or output data in the 24 hours ending 03/07/18 1900 Filed Weights   03/07/18 1538  Weight: 234 lb (106.1 kg)   Body mass index is 41.45 kg/m.  General:  Young, obese female,   NAD , pain free at this time  HEENT: normal Lymph: no adenopathy Neck: no JVD Endocrine:  No thryomegaly Vascular: No carotid bruits; FA pulses 2+ bilaterally without bruits  Cardiac:  normal S1, S2; RRR; no murmur  Lungs:  clear to auscultation bilaterally, no wheezing, rhonchi or rales  Abd: obese,   Non tender  Ext: no edema Musculoskeletal:  No deformities, BUE and BLE strength normal and equal Skin: warm and dry  Neuro:  CNs 2-12 intact, no focal abnormalities noted Psych:  Normal affect   EKG:  The EKG was personally reviewed and demonstrates:  NSR .  There is new TWI in leads I and aVL . ( new from yesterday )   Telemetry:  Telemetry was personally reviewed and demonstrates:   NSR   Relevant CV Studies:   Laboratory Data:  Chemistry Recent Labs  Lab 03/06/18 1535 03/07/18 1540  NA 137 138  K 4.0 3.7  CL 104 106  CO2 22 21*  GLUCOSE 119* 132*  BUN 13 14  CREATININE 0.70  0.75  CALCIUM 8.7* 8.3*  GFRNONAA >60 >60  GFRAA >60 >60  ANIONGAP 11 11    Recent Labs  Lab 03/06/18 1535 03/07/18 1540  PROT 7.4 7.0  ALBUMIN 3.8 3.5  AST 28 23  ALT 31 26  ALKPHOS 77 70  BILITOT 0.5 0.6   Hematology Recent Labs  Lab 03/06/18 1535 03/07/18 1540  WBC 14.7* 11.8*  RBC 4.24 4.27  HGB 12.2 12.2  HCT 36.8 37.1  MCV 86.8 86.8  MCH 28.8 28.4  MCHC 33.2 32.8  RDW 14.8* 14.7*  PLT 408 422   Cardiac Enzymes Recent Labs  Lab 03/07/18 1538  TROPONINI 0.03*   No results for input(s): TROPIPOC in the last 168 hours.  BNPNo results for input(s): BNP, PROBNP in the last 168 hours.  DDimer No results for input(s): DDIMER in the last 168 hours.  Radiology/Studies:  Dg Chest 2 View  Result Date: 03/07/2018 CLINICAL DATA:  Chest pain EXAM: CHEST - 2 VIEW COMPARISON:  01/29/2016 FINDINGS: The heart size and mediastinal contours are within normal limits. Both lungs are clear. The visualized skeletal structures are unremarkable. IMPRESSION: No active cardiopulmonary disease. Electronically Signed   By: Kennith Center M.D.   On: 03/07/2018 17:23   Ct Angio Chest Pe W And/or Wo Contrast  Result Date: 03/07/2018 CLINICAL DATA:  2 weeks postpartum. High pretest probability for pulmonary embolus. EXAM: CT ANGIOGRAPHY CHEST WITH CONTRAST TECHNIQUE: Multidetector CT imaging of the chest was performed using the standard protocol during bolus administration of intravenous contrast. Multiplanar CT image reconstructions and MIPs were obtained to evaluate the vascular anatomy. CONTRAST:  75mL ISOVUE-370 IOPAMIDOL (ISOVUE-370) INJECTION 76% COMPARISON:  None. FINDINGS: Cardiovascular: The heart size is normal. No pericardial effusion. No thoracic aortic aneurysm. No dissection of the thoracic aorta. No evidence for filling defect in the opacified pulmonary arteries to suggest the presence of an acute pulmonary embolus. Mediastinum/Nodes: No mediastinal lymphadenopathy. There is no hilar  lymphadenopathy. The esophagus has normal imaging features. There is no axillary lymphadenopathy. Lungs/Pleura: 5 mm a triangle-shaped nodule along the minor fissure (image 47 series 7) is compatible with subpleural lymph node. 7 mm pulmonary nodule identified right lung (image 40/7). No pulmonary edema. No focal airspace consolidation. No pleural effusion. Upper Abdomen: Unremarkable. Musculoskeletal: Bone windows reveal no worrisome lytic or sclerotic osseous lesions. Review of the MIP images confirms the above findings. IMPRESSION: 1. No CT evidence for acute pulmonary embolus. 2. 7 mm right lower lobe pulmonary nodule. Non-contrast chest CT at 6-12 months is recommended. If the nodule is stable at time of repeat CT, then future CT at 18-24 months (from today's scan) is considered optional for low-risk patients, but is recommended for high-risk patients. This recommendation follows the consensus statement: Guidelines for Management of Incidental Pulmonary Nodules Detected on CT Images: From the Fleischner Society 2017; Radiology 2017; 284:228-243. Electronically Signed   By: Kennith CenterEric  Mansell M.D.   On: 03/07/2018 18:37   [place] Abdomen Limited Ruq  Result Date: 03/06/2018 CLINICAL DATA:  37 y/o F; epigastric and right upper quadrant abdominal pain for 1 day. 2-1/2 weeks postpartum. EXAM: ULTRASOUND ABDOMEN LIMITED RIGHT UPPER QUADRANT COMPARISON:  None. FINDINGS: Gallbladder: Multiple mobile gallstones measuring up to 6.3 mm. No gallbladder wall thickening or pericholecystic fluid. Negative sonographic Murphy's sign. Common bile duct: Diameter: 3 mm Liver: No focal lesion identified. Within normal limits in parenchymal echogenicity. Portal vein is patent on color Doppler imaging with normal direction of blood flow towards the liver. IMPRESSION: Cholelithiasis.  No secondary signs of acute cholecystitis. Electronically Signed   By: Mitzi HansenLance  Furusawa-Stratton M.D.   On: 03/06/2018 18:02    Assessment and Plan:    1. Possible ACS:   Pt presents with several days epigastric chest pain and burning with radiation up into her chest and through to her back.  She states that she is never had pain this severe before.  She has taken several Zantac and acid reducers with no relief.  Pain is also not relieved with belching or passing gas.  She is also had some right shoulder pain.  She now presents with a very minimally elevated troponin level and new T wave inversions in leads I and aVL.  This is concerning for spontaneous coronary artery dissection.  We will check an EKG tomorrow morning.  She should have an EKG tonight if she develops more chest pain.  She continues to have some bleeding following her induced delivery 2 weeks ago.  We will check with her OB/GYN team to see if she is a candidate for heparin.  The oncall MD for [facility] gave the OK for starting anticoagulation.   CT angiogram of the chest was negative for pulmonary embolus.  If she becomes unstable and develops recurrent chest pain, right shoulder pain, or abdominal pain she may need to be considered for urgent heart catheterization.  Our team will follow with you tomorrow.  For questions or updates, please contact [facility] Please consult [email or web address] for contact info under Cardiology/STEMI.   Signed, Kristeen MissPhilip Sulay Brymer, MD  03/07/2018 7:00 PM

## 2018-03-07 NOTE — Telephone Encounter (Signed)
Lm for pt letting her know that FMLA paperwork has been faxed through.

## 2018-03-07 NOTE — Progress Notes (Signed)
ANTICOAGULATION CONSULT NOTE - Initial Consult  Pharmacy Consult for heparin gtt Indication: chest pain/ACS  Allergies  Allergen Reactions  . Sulfa Antibiotics Other (See Comments)    seizures  . Levaquin [Levofloxacin In D5w] Hives  . Penicillins Hives    Patient Measurements: Height: 5\' 3"  (160 cm) Weight: 234 lb (106.1 kg) IBW/kg (Calculated) : 52.4 Heparin Dosing Weight: 77.7kg  Vital Signs: Temp: 98.5 F (36.9 C) (03/14 1527) Temp Source: Oral (03/14 1527) BP: 130/91 (03/14 1815) Pulse Rate: 91 (03/14 1815)  Labs: Recent Labs    03/06/18 1535 03/07/18 1538 03/07/18 1540  HGB 12.2  --  12.2  HCT 36.8  --  37.1  PLT 408  --  422  CREATININE 0.70  --  0.75  TROPONINI  --  0.03*  --     Estimated Creatinine Clearance: 113.4 mL/min (by C-G formula based on SCr of 0.75 mg/dL).   Medical History: Past Medical History:  Diagnosis Date  . Asthma    has not used inhaler in years  . Breast mass x 1  month   about a 1 cm mass Left at 2 o'clock per pt  . Complication of anesthesia    difficulty voiding postop  . Family history of hemophilia 05/04/2014   patient has never been tested  . GERD (gastroesophageal reflux disease)   . Heart murmur    as child  . Hypothyroid    h/o outgrew  . PCOS (polycystic ovarian syndrome)     Medications:   (Not in a hospital admission) Scheduled:  . aspirin  324 mg Oral Once  . [START ON 03/08/2018] aspirin EC  81 mg Oral Daily  . heparin  4,000 Units Intravenous Once  . pantoprazole  40 mg Oral BID AC   Infusions:  . heparin     PRN: acetaminophen **OR** acetaminophen, albuterol, nitroGLYCERIN, ondansetron **OR** ondansetron (ZOFRAN) IV, oxyCODONE, polyethylene glycol Anti-infectives (From admission, onward)   None      Assessment: 37 year old female with ACS requiring heparin gtt per pharmacy protocol.   Goal of Therapy:  Heparin level 0.3-0.7 units/ml Monitor platelets by anticoagulation protocol: Yes    Plan:  Give 4000 units bolus x 1 Start heparin infusion at 900 units/hr Check anti-Xa level in 6 hours and daily while on heparin Continue to monitor H&H and platelets  Gerre PebblesGarrett Vaiden Adames 03/07/2018,7:11 PM

## 2018-03-07 NOTE — ED Triage Notes (Signed)
Pt presents to ED c/o RUQ. Seen in this ED yesterday for same and sent home with compazine and referral to general surgery for biliary colic. Pt states she has been unable to get Rx filled or referral called yet. +nausea, no vomiting, no diarrhea.

## 2018-03-07 NOTE — ED Provider Notes (Signed)
[facility] Emergency Department Provider Note  ____________________________________________   First MD Initiated Contact with Patient 03/07/18 1625     (approximate)  I have reviewed the triage vital signs and the nursing notes.   HISTORY  Chief Complaint Abdominal Pain   HPI Kimberly Mcfarland is a 37 y.o. female with a history of reflux was approximately 2-1/2 weeks postpartum who is presenting with burning upper abdominal as well as chest pain.  The chest pain is radiating through to her back.  She is denying any shortness of breath, nausea or vomiting.  Says that she is having difficulty eating and it hurts much worse when she eats or drinks anything.  Says that she has been taking Zantac at home as well as Tums without relief of her symptoms.  Says that she is at reflux in the past with similar pattern of pain but with less severity than she is experiencing at this time.  Patient was seen here yesterday and diagnosed with biliary colic and was discharged at that time.  Past Medical History:  Diagnosis Date  . Asthma    has not used inhaler in years  . Breast mass x 1  month   about a 1 cm mass Left at 2 o'clock per pt  . Complication of anesthesia    difficulty voiding postop  . Family history of hemophilia 05/04/2014   patient has never been tested  . GERD (gastroesophageal reflux disease)   . Heart murmur    as child  . Hypothyroid    h/o outgrew  . PCOS (polycystic ovarian syndrome)     Patient Active Problem List   Diagnosis Date Noted  . Postpartum care following vaginal delivery 02/20/2018  . Preeclampsia 02/17/2018  . Antepartum mild preeclampsia 02/13/2018  . Family history of hemophilia 02/13/2018  . Obesity in pregnancy 02/13/2018  . Elevated blood pressure affecting pregnancy in third trimester, antepartum 02/05/2018  . Palpitations 01/26/2018  . Advanced maternal age in multigravida 09/12/2017  . Supervision of high-risk pregnancy  of elderly multigravida (>= 25 years old at time of delivery) 09/12/2017  . Hx of preeclampsia, prior pregnancy, currently pregnant 09/12/2017    Past Surgical History:  Procedure Laterality Date  . CYST EXCISION  2010   head cysts x 14.  high levels of keratin  . LESION EXCISION N/A 02/16/2016   Procedure: EXCISION SCALP LESION,excision 2 large scalp cysrts with intermediate closure;  Surgeon: Kieth Brightly, MD;  Location: [facility] ORS;  Service: General;  Laterality: N/A;  . WISDOM TOOTH EXTRACTION      Prior to Admission medications   Medication Sig Start Date End Date Taking? Authorizing Provider  acetaminophen (TYLENOL) 500 MG tablet Take 1,000 mg by mouth every 6 (six) hours as needed for headache.    [provider]  albuterol (PROVENTIL HFA;VENTOLIN HFA) 108 (90 BASE) MCG/ACT inhaler Inhale 2 puffs into the lungs every 4 (four) hours as needed for wheezing or shortness of breath.    [provider]  calcium carbonate (TUMS - DOSED IN MG ELEMENTAL CALCIUM) 500 MG chewable tablet Chew 1 tablet by mouth as needed for indigestion or heartburn.    [provider]  Ferrous Sulfate (IRON) 325 (65 Fe) MG TABS Take 1 tablet (325 mg total) by mouth 2 (two) times daily. 02/20/18   Farrel Conners, CNM  ibuprofen (ADVIL,MOTRIN) 600 MG tablet Take 1 tablet (600 mg total) by mouth every 6 (six) hours. 02/20/18   Farrel Conners,  CNM  labetalol (NORMODYNE) 200 MG tablet Take 1 tablet (200 mg total) by mouth 2 (two) times daily. 02/25/18   Oswaldo ConroySchmid, Jacelyn Y, CNM  Prenatal Vit-Fe Fumarate-FA (PRENATAL MULTIVITAMIN) TABS tablet Take 1 tablet by mouth daily at 12 noon.    [provider]  prochlorperazine (COMPAZINE) 10 MG tablet Take 1 tablet (10 mg total) by mouth every 6 (six) hours as needed for nausea or vomiting. 03/06/18   Willy Eddyobinson, Patrick, MD  ranitidine (ZANTAC) 150 MG tablet Take 150 mg by mouth 2 (two) times daily.    [provider]     Allergies Sulfa antibiotics; Levaquin [levofloxacin in d5w]; and Penicillins  Family History  Problem Relation Age of Onset  . Diabetes Mother   . Asthma Mother   . Obesity Mother   . Hypertension Mother   . Hemophilia Mother   . Heart disease Father   . Heart attack Father   . Breast cancer Other   . Diabetes Maternal Grandmother        type 2  . Factor VIII deficiency Other        Also has factor 9 deficiency  . Factor VIII deficiency Other        also has factor 9 deficiency  . Hemophilia Maternal Grandfather     Social History Social History   Tobacco Use  . Smoking status: Never Smoker  . Smokeless tobacco: Never Used  Substance Use Topics  . Alcohol use: No    Alcohol/week: 0.0 oz    Frequency: Never    Comment: wine occasionally/not while pregnant  . Drug use: No    Review of Systems  Constitutional: No fever/chills Eyes: No visual changes. ENT: No sore throat. Cardiovascular: Denies chest pain. Respiratory: Denies shortness of breath. Gastrointestinal: No abdominal pain.  No nausea, no vomiting.  No diarrhea.  No constipation. Genitourinary: Negative for dysuria. Musculoskeletal: Negative for back pain. Skin: Negative for rash. Neurological: Negative for headaches, focal weakness or numbness. ____________________________________________   PHYSICAL EXAM:  VITAL SIGNS: ED Triage Vitals  Enc Vitals Group     BP 03/07/18 1527 115/87     Pulse Rate 03/07/18 1527 90     Resp 03/07/18 1527 16     Temp 03/07/18 1527 98.5 F (36.9 C)     Temp Source 03/07/18 1527 Oral     SpO2 03/07/18 1527 98 %     Weight 03/07/18 1538 234 lb (106.1 kg)     Height 03/07/18 1538 5\' 3"  (1.6 m)     Head Circumference --      Peak Flow --      Pain Score 03/07/18 1538 6     Pain Loc --      Pain Edu? --      Excl. in GC? --     Constitutional: Alert and oriented. Well appearing and in no acute distress. Eyes: Conjunctivae are normal.  Head:  Atraumatic. Nose: No congestion/rhinnorhea. Mouth/Throat: Mucous membranes are moist.  Neck: No stridor.   Cardiovascular: Normal rate, regular rhythm. Grossly normal heart sounds.  Respiratory: Normal respiratory effort.  No retractions. Lungs CTAB. Gastrointestinal: Soft and nontender. No distention. No CVA tenderness. Musculoskeletal: No lower extremity tenderness nor edema.  No joint effusions. Neurologic:  Normal speech and language. No gross focal neurologic deficits are appreciated. Skin:  Skin is warm, dry and intact. No rash noted. Psychiatric: Mood and affect are normal. Speech and behavior are normal.  ____________________________________________   LABS (all labs ordered are  listed, but only abnormal results are displayed)  Labs Reviewed  COMPREHENSIVE METABOLIC PANEL - Abnormal; Notable for the following components:      Result Value   CO2 21 (*)    Glucose, Bld 132 (*)    Calcium 8.3 (*)    All other components within normal limits  CBC - Abnormal; Notable for the following components:   WBC 11.8 (*)    RDW 14.7 (*)    All other components within normal limits  URINALYSIS, COMPLETE (UACMP) WITH MICROSCOPIC - Abnormal; Notable for the following components:   Color, Urine AMBER (*)    APPearance HAZY (*)    Hgb urine dipstick LARGE (*)    Ketones, ur 5 (*)    Protein, ur 30 (*)    Leukocytes, UA SMALL (*)    Squamous Epithelial / LPF 0-5 (*)    All other components within normal limits  TROPONIN I - Abnormal; Notable for the following components:   Troponin I 0.03 (*)    All other components within normal limits  LIPASE, BLOOD  POCT PREGNANCY, URINE   ____________________________________________  EKG  ED ECG REPORT I, Arelia Longest, the attending physician, personally viewed and interpreted this ECG.   Date: 03/07/2018  EKG Time: 1523  Rate: 92  Rhythm: normal sinus rhythm  Axis: Right superior axis deviation  Intervals:none  ST&T Change: No ST  segment elevation or depression.  T wave inversions in 1, 2 as well as aVL.  Changes are new from previous.  ____________________________________________  RADIOLOGY  No pulmonary embolus, CT angiography of the chest.  Patient does have a 7 mm right lower lobe pulmonary nodule. ____________________________________________   PROCEDURES  Procedure(s) performed:   Procedures  Critical Care performed:   ____________________________________________   INITIAL IMPRESSION / ASSESSMENT AND PLAN / ED COURSE  Pertinent labs & imaging results that were available during my care of the patient were reviewed by me and considered in my medical decision making (see chart for details).  Differential diagnosis includes, but is not limited to, ACS, aortic dissection, pulmonary embolism, cardiac tamponade, pneumothorax, pneumonia, pericarditis, myocarditis, GI-related causes including esophagitis/gastritis, and musculoskeletal chest wall pain.   As part of my medical decision making, I reviewed the following data within the electronic MEDICAL RECORD NUMBER Notes from prior ED visits  ----------------------------------------- 6:48 PM on 03/07/2018 -----------------------------------------  I discussed case Dr. Lourena Simmonds of cardiology who discussed the case with the on-call interventionalist who says that they feel comfortable keeping the patient here at [facility].  The patient is pain-free at this time but was initially not pain-free after the GI cocktail.  Dr. Lourena Simmonds did recommend nitroglycerin with patient was still having pain.  However, since she is not having any pain at this time will hold on nitroglycerin.  Patient is aware of need to be admitted to the hospital.  My worst-case suspicion is that the patient could have a dissection of a coronary artery.  Dr. Lourena Simmonds says that he will see the patient as a consult in the emergency department.  Patient is aware of the need to be admitted to the  hospital. ____________________________________________   FINAL CLINICAL IMPRESSION(S) / ED DIAGNOSES  Chest pain.    NEW MEDICATIONS STARTED DURING THIS VISIT:  New Prescriptions   No medications on file     Note:  This document was prepared using Dragon voice recognition software and may include unintentional dictation errors.     Myrna Blazer, MD 03/07/18 [phone]

## 2018-03-07 NOTE — ED Notes (Signed)
Date and time results received: 03/07/18 1734 (use smartphrase ".now" to insert current time)  Test: troponin Critical Value: 0.03  Name of Provider Notified: Schaevitz   Orders Received? Or Actions Taken?: Orders Received - See Orders for details

## 2018-03-08 ENCOUNTER — Observation Stay: Payer: 59 | Admitting: Anesthesiology

## 2018-03-08 ENCOUNTER — Encounter
Admission: EM | Disposition: A | Discharge status: Home / Self Care | Payer: Self-pay | Source: Home / Self Care | Attending: Emergency Medicine

## 2018-03-08 ENCOUNTER — Encounter: Payer: Self-pay | Admitting: Anesthesiology

## 2018-03-08 ENCOUNTER — Observation Stay (HOSPITAL_BASED_OUTPATIENT_CLINIC_OR_DEPARTMENT_OTHER)
Admit: 2018-03-08 | Discharge: 2018-03-08 | Disposition: A | Discharge status: Home / Self Care | Payer: 59 | Attending: Cardiovascular Disease | Admitting: Cardiovascular Disease

## 2018-03-08 ENCOUNTER — Observation Stay: Admit: 2018-03-08 | Payer: 59

## 2018-03-08 DIAGNOSIS — O165 Unspecified maternal hypertension, complicating the puerperium: Secondary | ICD-10-CM | POA: Diagnosis not present

## 2018-03-08 DIAGNOSIS — R1011 Right upper quadrant pain: Secondary | ICD-10-CM | POA: Diagnosis not present

## 2018-03-08 DIAGNOSIS — R079 Chest pain, unspecified: Secondary | ICD-10-CM

## 2018-03-08 DIAGNOSIS — O9953 Diseases of the respiratory system complicating the puerperium: Secondary | ICD-10-CM | POA: Diagnosis not present

## 2018-03-08 DIAGNOSIS — J45909 Unspecified asthma, uncomplicated: Secondary | ICD-10-CM | POA: Diagnosis not present

## 2018-03-08 DIAGNOSIS — R9431 Abnormal electrocardiogram [ECG] [EKG]: Secondary | ICD-10-CM | POA: Diagnosis not present

## 2018-03-08 DIAGNOSIS — K8000 Calculus of gallbladder with acute cholecystitis without obstruction: Secondary | ICD-10-CM | POA: Diagnosis not present

## 2018-03-08 DIAGNOSIS — K8010 Calculus of gallbladder with chronic cholecystitis without obstruction: Secondary | ICD-10-CM | POA: Diagnosis not present

## 2018-03-08 DIAGNOSIS — I10 Essential (primary) hypertension: Secondary | ICD-10-CM

## 2018-03-08 DIAGNOSIS — K8070 Calculus of gallbladder and bile duct without cholecystitis without obstruction: Secondary | ICD-10-CM

## 2018-03-08 DIAGNOSIS — O9963 Diseases of the digestive system complicating the puerperium: Secondary | ICD-10-CM | POA: Diagnosis not present

## 2018-03-08 DIAGNOSIS — R109 Unspecified abdominal pain: Secondary | ICD-10-CM | POA: Diagnosis not present

## 2018-03-08 DIAGNOSIS — E039 Hypothyroidism, unspecified: Secondary | ICD-10-CM | POA: Diagnosis not present

## 2018-03-08 DIAGNOSIS — R1013 Epigastric pain: Secondary | ICD-10-CM | POA: Diagnosis not present

## 2018-03-08 DIAGNOSIS — O26893 Other specified pregnancy related conditions, third trimester: Secondary | ICD-10-CM | POA: Diagnosis not present

## 2018-03-08 DIAGNOSIS — K219 Gastro-esophageal reflux disease without esophagitis: Secondary | ICD-10-CM | POA: Diagnosis not present

## 2018-03-08 DIAGNOSIS — K8020 Calculus of gallbladder without cholecystitis without obstruction: Secondary | ICD-10-CM | POA: Diagnosis not present

## 2018-03-08 DIAGNOSIS — O9081 Anemia of the puerperium: Secondary | ICD-10-CM | POA: Diagnosis not present

## 2018-03-08 HISTORY — PX: CHOLECYSTECTOMY: SHX55

## 2018-03-08 LAB — CBC
HCT: 33.3 % — ABNORMAL LOW (ref 35.0–47.0)
Hemoglobin: 11 g/dL — ABNORMAL LOW (ref 12.0–16.0)
MCH: 28.8 pg (ref 26.0–34.0)
MCHC: 33.1 g/dL (ref 32.0–36.0)
MCV: 87.2 fL (ref 80.0–100.0)
PLATELETS: 336 10*3/uL (ref 150–440)
RBC: 3.82 MIL/uL (ref 3.80–5.20)
RDW: 14.6 % — AB (ref 11.5–14.5)
WBC: 9.3 10*3/uL (ref 3.6–11.0)

## 2018-03-08 LAB — ECHOCARDIOGRAM COMPLETE
Height: 63 in
WEIGHTICAEL: 3707.2 [oz_av]

## 2018-03-08 LAB — TROPONIN I: Troponin I: <0.03 ng/mL (ref <0.03)

## 2018-03-08 LAB — HEPARIN LEVEL (UNFRACTIONATED): Heparin Unfractionated: <0.1 IU/mL — ABNORMAL LOW (ref 0.30–0.70)

## 2018-03-08 SURGERY — LAPAROSCOPIC CHOLECYSTECTOMY WITH INTRAOPERATIVE CHOLANGIOGRAM
Anesthesia: General

## 2018-03-08 MED ORDER — CHLORHEXIDINE GLUCONATE CLOTH 2 % EX PADS
6.0000 | MEDICATED_PAD | Freq: Once | CUTANEOUS | Status: AC
  Administered 2018-03-08: 6 via TOPICAL

## 2018-03-08 MED ORDER — ONDANSETRON HCL 4 MG/2ML IJ SOLN
4.0000 mg | Freq: Four times a day (QID) | INTRAMUSCULAR | Status: DC | PRN
  Filled 2018-03-08: qty 2

## 2018-03-08 MED ORDER — PHENYLEPHRINE HCL 10 MG/ML IJ SOLN
INTRAMUSCULAR | Status: DC | PRN
  Administered 2018-03-08: 100 ug via INTRAVENOUS

## 2018-03-08 MED ORDER — FENTANYL CITRATE (PF) 100 MCG/2ML IJ SOLN
INTRAMUSCULAR | Status: AC
  Administered 2018-03-08: 25 ug via INTRAVENOUS
  Filled 2018-03-08: qty 2

## 2018-03-08 MED ORDER — POTASSIUM CHLORIDE IN NACL 20-0.45 MEQ/L-% IV SOLN
INTRAVENOUS | Status: DC
Start: 2018-03-08 — End: 2018-03-09
  Administered 2018-03-08 – 2018-03-09 (×2): via INTRAVENOUS
  Filled 2018-03-08 (×4): qty 1000

## 2018-03-08 MED ORDER — ACETAMINOPHEN 650 MG RE SUPP
650.0000 mg | Freq: Four times a day (QID) | RECTAL | Status: DC | PRN
  Filled 2018-03-08: qty 1

## 2018-03-08 MED ORDER — DIPHENHYDRAMINE HCL 50 MG/ML IJ SOLN
INTRAMUSCULAR | Status: DC | PRN
  Administered 2018-03-08: 25 mg via INTRAVENOUS

## 2018-03-08 MED ORDER — MIDAZOLAM HCL 2 MG/2ML IJ SOLN
INTRAMUSCULAR | Status: AC
  Filled 2018-03-08: qty 2

## 2018-03-08 MED ORDER — OXYCODONE-ACETAMINOPHEN 5-325 MG PO TABS
1.0000 | ORAL_TABLET | ORAL | Status: DC | PRN
  Administered 2018-03-08 – 2018-03-09 (×2): 1 via ORAL
  Administered 2018-03-09: 2 via ORAL
  Administered 2018-03-09: 1 via ORAL
  Filled 2018-03-08 (×2): qty 1
  Filled 2018-03-08: qty 2
  Filled 2018-03-08: qty 1

## 2018-03-08 MED ORDER — LIDOCAINE HCL (CARDIAC) 20 MG/ML IV SOLN
INTRAVENOUS | Status: DC | PRN
  Administered 2018-03-08: 100 mg via INTRAVENOUS

## 2018-03-08 MED ORDER — PROPOFOL 10 MG/ML IV BOLUS
INTRAVENOUS | Status: DC | PRN
  Administered 2018-03-08: 130 mg via INTRAVENOUS

## 2018-03-08 MED ORDER — ENOXAPARIN SODIUM 40 MG/0.4ML ~~LOC~~ SOLN
40.0000 mg | SUBCUTANEOUS | Status: DC
  Administered 2018-03-09: 40 mg via SUBCUTANEOUS
  Filled 2018-03-08: qty 0.4

## 2018-03-08 MED ORDER — ROCURONIUM BROMIDE 100 MG/10ML IV SOLN
INTRAVENOUS | Status: DC | PRN
  Administered 2018-03-08: 20 mg via INTRAVENOUS
  Administered 2018-03-08: 30 mg via INTRAVENOUS

## 2018-03-08 MED ORDER — ONDANSETRON HCL 4 MG/2ML IJ SOLN
INTRAMUSCULAR | Status: DC | PRN
  Administered 2018-03-08: 4 mg via INTRAVENOUS

## 2018-03-08 MED ORDER — VANCOMYCIN HCL 10 G IV SOLR
1500.0000 mg | Freq: Three times a day (TID) | INTRAVENOUS | Status: AC
  Administered 2018-03-09: 1500 mg via INTRAVENOUS
  Filled 2018-03-08 (×2): qty 1500

## 2018-03-08 MED ORDER — HEPARIN BOLUS VIA INFUSION
2300.0000 [IU] | Freq: Once | INTRAVENOUS | Status: AC
  Administered 2018-03-08: 2300 [IU] via INTRAVENOUS
  Filled 2018-03-08: qty 2300

## 2018-03-08 MED ORDER — BUPIVACAINE HCL (PF) 0.5 % IJ SOLN
INTRAMUSCULAR | Status: AC
  Filled 2018-03-08: qty 30

## 2018-03-08 MED ORDER — DIPHENHYDRAMINE HCL 50 MG/ML IJ SOLN
INTRAMUSCULAR | Status: AC
  Filled 2018-03-08: qty 1

## 2018-03-08 MED ORDER — ONDANSETRON 4 MG PO TBDP
4.0000 mg | ORAL_TABLET | Freq: Four times a day (QID) | ORAL | Status: DC | PRN
  Administered 2018-03-08 – 2018-03-09 (×2): 4 mg via ORAL
  Filled 2018-03-08 (×3): qty 1

## 2018-03-08 MED ORDER — ONDANSETRON HCL 4 MG/2ML IJ SOLN: 4.0000 mg | Freq: Once | INTRAMUSCULAR | Status: DC | PRN

## 2018-03-08 MED ORDER — FENTANYL CITRATE (PF) 100 MCG/2ML IJ SOLN
INTRAMUSCULAR | Status: AC
  Filled 2018-03-08: qty 2

## 2018-03-08 MED ORDER — FENTANYL CITRATE (PF) 100 MCG/2ML IJ SOLN
INTRAMUSCULAR | Status: DC | PRN
  Administered 2018-03-08: 100 ug via INTRAVENOUS
  Administered 2018-03-08: 25 ug via INTRAVENOUS

## 2018-03-08 MED ORDER — SUGAMMADEX SODIUM 500 MG/5ML IV SOLN
INTRAVENOUS | Status: DC | PRN
  Administered 2018-03-08: 225 mg via INTRAVENOUS

## 2018-03-08 MED ORDER — DIPHENHYDRAMINE HCL 50 MG/ML IJ SOLN
12.5000 mg | Freq: Four times a day (QID) | INTRAMUSCULAR | Status: DC | PRN
  Administered 2018-03-08: 12.5 mg via INTRAVENOUS
  Filled 2018-03-08: qty 1

## 2018-03-08 MED ORDER — LIDOCAINE HCL (PF) 1 % IJ SOLN
INTRAMUSCULAR | Status: AC
  Filled 2018-03-08: qty 30

## 2018-03-08 MED ORDER — ACETAMINOPHEN 325 MG PO TABS: 650.0000 mg | ORAL_TABLET | Freq: Four times a day (QID) | ORAL | Status: DC | PRN

## 2018-03-08 MED ORDER — DEXAMETHASONE SODIUM PHOSPHATE 10 MG/ML IJ SOLN
INTRAMUSCULAR | Status: DC | PRN
  Administered 2018-03-08: 6 mg via INTRAVENOUS

## 2018-03-08 MED ORDER — KETOROLAC TROMETHAMINE 30 MG/ML IJ SOLN: 30.0000 mg | Freq: Four times a day (QID) | INTRAMUSCULAR | Status: DC

## 2018-03-08 MED ORDER — FENTANYL CITRATE (PF) 100 MCG/2ML IJ SOLN
25.0000 ug | INTRAMUSCULAR | Status: DC | PRN
  Administered 2018-03-08 (×2): 25 ug via INTRAVENOUS

## 2018-03-08 MED ORDER — LACTATED RINGERS IV SOLN
INTRAVENOUS | Status: DC | PRN
  Administered 2018-03-08: 18:00:00 via INTRAVENOUS

## 2018-03-08 MED ORDER — LIDOCAINE HCL 1 % IJ SOLN
INTRAMUSCULAR | Status: DC | PRN
  Administered 2018-03-08: 20 mL

## 2018-03-08 MED ORDER — CHLORHEXIDINE GLUCONATE CLOTH 2 % EX PADS: 6.0000 | MEDICATED_PAD | Freq: Once | CUTANEOUS | Status: DC

## 2018-03-08 MED ORDER — VANCOMYCIN HCL 10 G IV SOLR
1500.0000 mg | INTRAVENOUS | Status: AC
  Administered 2018-03-08: 1500 mg via INTRAVENOUS
  Filled 2018-03-08: qty 1500

## 2018-03-08 MED ORDER — DIPHENHYDRAMINE HCL 25 MG PO CAPS: 50.0000 mg | ORAL_CAPSULE | Freq: Four times a day (QID) | ORAL | Status: DC | PRN

## 2018-03-08 MED ORDER — SODIUM CHLORIDE 0.9 % IV SOLN
INTRAVENOUS | Status: DC
  Administered 2018-03-08: 09:00:00 via INTRAVENOUS

## 2018-03-08 MED ORDER — SODIUM CHLORIDE 0.9% FLUSH
3.0000 mL | Freq: Two times a day (BID) | INTRAVENOUS | Status: DC
  Administered 2018-03-08 – 2018-03-09 (×3): 3 mL via INTRAVENOUS

## 2018-03-08 MED ORDER — LACTATED RINGERS IV SOLN: INTRAVENOUS | Status: DC | PRN

## 2018-03-08 MED ORDER — MIDAZOLAM HCL 2 MG/2ML IJ SOLN
INTRAMUSCULAR | Status: DC | PRN
  Administered 2018-03-08: 2 mg via INTRAVENOUS

## 2018-03-08 MED ORDER — MORPHINE SULFATE (PF) 2 MG/ML IV SOLN: 2.0000 mg | INTRAVENOUS | Status: DC | PRN

## 2018-03-08 SURGICAL SUPPLY — 36 items
APPLIER CLIP ROT 10 11.4 M/L (STAPLE) ×2
CATH REDDICK CHOLANGI 4FR 50CM (CATHETERS) IMPLANT
CHLORAPREP W/TINT 26ML (MISCELLANEOUS) ×2 IMPLANT
CLIP APPLIE ROT 10 11.4 M/L (STAPLE) ×1 IMPLANT
DECANTER SPIKE VIAL GLASS SM (MISCELLANEOUS) IMPLANT
DERMABOND ADVANCED (GAUZE/BANDAGES/DRESSINGS) ×1
DERMABOND ADVANCED .7 DNX12 (GAUZE/BANDAGES/DRESSINGS) ×1 IMPLANT
DRESSING SURGICEL FIBRLLR 1X2 (HEMOSTASIS) IMPLANT
DRSG SURGICEL FIBRILLAR 1X2 (HEMOSTASIS)
ELECT REM PT RETURN 9FT ADLT (ELECTROSURGICAL) ×2
ELECTRODE REM PT RTRN 9FT ADLT (ELECTROSURGICAL) ×1 IMPLANT
GLOVE BIO SURGEON STRL SZ7 (GLOVE) ×2 IMPLANT
GLOVE BIOGEL PI IND STRL 7.5 (GLOVE) ×1 IMPLANT
GLOVE BIOGEL PI INDICATOR 7.5 (GLOVE) ×1
GOWN STRL REUS W/ TWL LRG LVL3 (GOWN DISPOSABLE) ×3 IMPLANT
GOWN STRL REUS W/TWL LRG LVL3 (GOWN DISPOSABLE) ×3
GRASPER SUT TROCAR 14GX15 (MISCELLANEOUS) ×2 IMPLANT
IRRIGATION STRYKERFLOW (MISCELLANEOUS) IMPLANT
IRRIGATOR STRYKERFLOW (MISCELLANEOUS)
IV NS 1000ML (IV SOLUTION)
IV NS 1000ML BAXH (IV SOLUTION) IMPLANT
KIT TURNOVER KIT A (KITS) ×2 IMPLANT
NEEDLE HYPO 22GX1.5 SAFETY (NEEDLE) ×2 IMPLANT
NEEDLE INSUFFLATION 14GA 120MM (NEEDLE) ×2 IMPLANT
NS IRRIG 1000ML POUR BTL (IV SOLUTION) ×2 IMPLANT
PACK LAP CHOLECYSTECTOMY (MISCELLANEOUS) ×2 IMPLANT
POUCH ENDO CATCH 10MM SPEC (MISCELLANEOUS) ×2 IMPLANT
SCISSORS METZENBAUM CVD 33 (INSTRUMENTS) ×2 IMPLANT
SLEEVE ENDOPATH XCEL 5M (ENDOMECHANICALS) ×4 IMPLANT
SUT MNCRL AB 4-0 PS2 18 (SUTURE) ×2 IMPLANT
SUT VICRYL 0 UR6 27IN ABS (SUTURE) ×2 IMPLANT
SUT VICRYL AB 3-0 FS1 BRD 27IN (SUTURE) ×2 IMPLANT
SYR 20CC LL (SYRINGE) IMPLANT
TROCAR XCEL NON-BLD 11X100MML (ENDOMECHANICALS) ×2 IMPLANT
TROCAR XCEL NON-BLD 5MMX100MML (ENDOMECHANICALS) ×2 IMPLANT
TUBING INSUFFLATION (TUBING) ×2 IMPLANT

## 2018-03-08 NOTE — Progress Notes (Signed)
*  PRELIMINARY RESULTS* Echocardiogram 2D Echocardiogram has been performed.  Kimberly GulaJoan M Davionne Mcfarland 03/08/2018, 9:10 AM

## 2018-03-08 NOTE — Transfer of Care (Signed)
Immediate Anesthesia Transfer of Care Note  Patient: Kimberly Mcfarland  Procedure(s) Performed: LAPAROSCOPIC CHOLECYSTECTOMY (N/A )  Patient Location: PACU  Anesthesia Type:General  Level of Consciousness: sedated  Airway & Oxygen Therapy: Patient Spontanous Breathing and Patient connected to face mask oxygen  Post-op Assessment: Report given to RN and Post -op Vital signs reviewed and stable  Post vital signs: Reviewed and stable  Last Vitals:  Vitals:   03/08/18 0507 03/08/18 1839  BP: 110/67 (!) 98/53  Pulse: 77 77  Resp: 16 (!) 23  Temp: 36.8 C   SpO2: 98% 100%    Last Pain:  Vitals:   03/08/18 0900  TempSrc:   PainSc: 3       Patients Stated Pain Goal: 0 (03/08/18 0900)  Complications: No apparent anesthesia complications

## 2018-03-08 NOTE — Anesthesia Post-op Follow-up Note (Signed)
Anesthesia QCDR form completed.        

## 2018-03-08 NOTE — Anesthesia Preprocedure Evaluation (Addendum)
Anesthesia Evaluation  Patient identified by MRN, date of birth, ID band Patient awake    Reviewed: Allergy & Precautions, NPO status , Patient's Chart, lab work & pertinent test results, reviewed documented beta blocker date and time   History of Anesthesia Complications (+) history of anesthetic complications  Airway Mallampati: III  TM Distance: >3 FB     Dental  (+) Chipped   Pulmonary asthma ,           Cardiovascular hypertension, Pt. on medications and Pt. on home beta blockers + Valvular Problems/Murmurs      Neuro/Psych    GI/Hepatic GERD  Controlled,  Endo/Other  Hypothyroidism   Renal/GU      Musculoskeletal   Abdominal   Peds  Hematology   Anesthesia Other Findings Obese. PCOS.Had difficulty voiding after surgery. Echo and EKG ok.  Reproductive/Obstetrics                            Anesthesia Physical Anesthesia Plan  ASA: III  Anesthesia Plan: General   Post-op Pain Management:    Induction: Intravenous  PONV Risk Score and Plan:   Airway Management Planned: Oral ETT  Additional Equipment:   Intra-op Plan:   Post-operative Plan:   Informed Consent: I have reviewed the patients History and Physical, chart, labs and discussed the procedure including the risks, benefits and alternatives for the proposed anesthesia with the patient or authorized representative who has indicated his/her understanding and acceptance.     Plan Discussed with: CRNA  Anesthesia Plan Comments:         Anesthesia Quick Evaluation

## 2018-03-08 NOTE — Progress Notes (Signed)
She has several small welts on her abdomin and back and is c/o itching.  No new medications that she may be allergic to.

## 2018-03-08 NOTE — Op Note (Signed)
SURGICAL OPERATIVE REPORT   DATE OF PROCEDURE: 03/08/2018  ATTENDING Surgeon(s): Ancil Linseyavis, Burch Marchuk Evan, MD  ANESTHESIA: GETA  PRE-OPERATIVE DIAGNOSIS: Symptomatic Cholelithiasis (K80.20)  POST-OPERATIVE DIAGNOSIS: Symptomatic Cholelithiasis (K80.20)  PROCEDURE(S): (cpt's: 47562) 1.) Laparoscopic Cholecystectomy  INTRAOPERATIVE FINDINGS: Distended gallbladder without significant pericholecystic inflammation  INTRAOPERATIVE FLUIDS: 1000 mL crystalloid   ESTIMATED BLOOD LOSS: Minimal (<30 mL)   URINE OUTPUT: No foley  SPECIMENS: Gallbladder  IMPLANTS: None  DRAINS: None   COMPLICATIONS: None apparent   CONDITION AT COMPLETION: Hemodynamically stable and extubated  DISPOSITION: PACU   INDICATION(S) FOR PROCEDURE:  Patient is a 37 y.o. female 2 weeks post-partum with pre-eclampsia during pregnancy who [facility] ED yesterday for evaluation of upper abdominal and chest pain. Patient underwent unremarkable cardiac workup and limited RUQ abdominal ultrasound, which demonstrated cholelithiasis. Patient reports she's experienced post-prandial abdominal pain after fatty foods such as onion rings x 7 years, which became worse during her recent pregnancy and became severe over the past 2 days. She reports her pain has been well-controlled while in the hospital, but she expresses anxiety about going home and experiencing the pain again. All risks, benefits, and alternatives to above elective procedures were discussed with the patient, who elected to proceed, and informed consent was accordingly obtained at that time.   DETAILS OF PROCEDURE:  Patient was brought to the operating suite and appropriately identified. General anesthesia was administered along with peri-operative prophylactic IV antibiotics, and endotracheal intubation was performed by anesthesiologist, along with NG/OG tube for gastric decompression. In supine position, operative site was prepped and draped in usual sterile  fashion, and following a brief time out, initial 5 mm incision was made in a natural skin crease just above the umbilicus. Fascia was then elevated, and a Verress needle was inserted and its proper position confirmed using aspiration and saline meniscus test.  Upon insufflation of the abdominal cavity with carbon dioxide to a well-tolerated pressure of 12-15 mmHg, 5 mm peri-umbilical port followed by laparoscope were inserted and used to inspect the abdominal cavity and its contents with no injuries from insertion of the first trochar noted. Three additional trocars were inserted, one at the epigastric position (10 mm) and two along the Right costal margin (5 mm). The table was then placed in reverse Trendelenburg position with the Right side up. Filmy adhesions between the gallbladder and omentum/duodenum/transverse colon were lysed using combined blunt dissection and selective electrocautery. The apex/dome of the gallbladder was grasped with an atraumatic grasper passed through the lateral port and retracted apically over the liver. The infundibulum was also grasped and retracted, exposing Calot's triangle. The peritoneum overlying the gallbladder infundibulum was incised and dissected free of surrounding peritoneal attachments, revealing the cystic duct and cystic artery, which were clipped twice on the patient side and once on the gallbladder specimen side close to the gallbladder. The gallbladder was then dissected from its peritoneal attachments to the liver using electrocautery, and the gallbladder was placed into a laparoscopic specimen bag and removed from the abdominal cavity via the epigastric port site. Hemostasis and secure placement of clips were confirmed, and intra-peritoneal cavity was inspected with no additional findings. PMI laparoscopic fascial closure device was then used to re-approximate fascia at the 10 mm epigastric port site.  All ports were then removed under direct visualization, and  abdominal cavity was desuflated. All port sites were irrigated/cleaned, additional local anesthetic was injected at each incision, 3-0 Vicryl was used to re-approximate dermis at 10 mm port site(s), and subcuticular  4-0 Monocryl suture was used to re-approximate skin. Skin was then cleaned, dried, and sterile skin glue was applied. Patient was then safely able to be awakened, extubated, and transferred to PACU for post-operative monitoring and care.   I was present for all aspects of procedure, and there were no intra-operative complications apparent.

## 2018-03-08 NOTE — Progress Notes (Signed)
Progress Note  Patient Name: Kimberly Mcfarland Date of Encounter: 03/08/2018  Primary Cardiologist: New to [facility] - consult by Nahser  Subjective   Admitted overnight with chest/abdominal pain with abnormal EKG and one troponin of 0.03, with subsequent values being negative. CTA chest was negative for PE with incidental pulmonary nodule noted.  RUQ ultrasound with cholelithiasis without signs of acute cholecystitis. WBC 11.8-->9.3, HGB 12.2-->11. Remains on heparin gtt. Vitals stable.   Continues to note epigastric pain radiating to the throat and shoulder blades.   Inpatient Medications    Scheduled Meds: . aspirin EC  81 mg Oral Daily  . ferrous sulfate  325 mg Oral BID  . labetalol  100 mg Oral BID  . pantoprazole  40 mg Oral BID AC  . prenatal multivitamin  1 tablet Oral Q1200   Continuous Infusions: . heparin 1,050 Units/hr (03/08/18 0329)   PRN Meds: acetaminophen **OR** acetaminophen, albuterol, nitroGLYCERIN, ondansetron **OR** ondansetron (ZOFRAN) IV, oxyCODONE, polyethylene glycol   Vital Signs    Vitals:   03/07/18 1538 03/07/18 1815 03/07/18 1957 03/08/18 0507  BP:  (!) 130/91 129/81 110/67  Pulse:  91 86 77  Resp:  16 18 16   Temp:   98.5 F (36.9 C) 98.3 F (36.8 C)  TempSrc:   Oral Oral  SpO2:  97% 99% 98%  Weight: 234 lb (106.1 kg)  235 lb 8 oz (106.8 kg) 231 lb 11.2 oz (105.1 kg)  Height: 5\' 3"  (1.6 m)       Intake/Output Summary (Last 24 hours) at 03/08/2018 0801 Last data filed at 03/08/2018 0527 Gross per 24 hour  Intake 69.93 ml  Output 500 ml  Net -430.07 ml   Filed Weights   03/07/18 1538 03/07/18 1957 03/08/18 0507  Weight: 234 lb (106.1 kg) 235 lb 8 oz (106.8 kg) 231 lb 11.2 oz (105.1 kg)    Telemetry    NSR - Personally Reviewed  ECG    Initial EKG on 3/14 showed NSR, 92 bpm, right axis deviation, inferolateral TWI. Repat EKG this morning showed NSR, 72 bpm, normal axis, improved inferolateral st/t changes - Personally  Reviewed  Physical Exam   GEN: No acute distress.   Neck: No JVD. Cardiac: RRR, no murmurs, rubs, or gallops.  Respiratory: Clear to auscultation bilaterally.  GI: Soft, nontender, non-distended.   MS: No edema; No deformity. Neuro:  Alert and oriented x 3; Nonfocal.  Psych: Normal affect.  Labs    Chemistry Recent Labs  Lab 03/06/18 1535 03/07/18 1540  NA 137 138  K 4.0 3.7  CL 104 106  CO2 22 21*  GLUCOSE 119* 132*  BUN 13 14  CREATININE 0.70 0.75  CALCIUM 8.7* 8.3*  PROT 7.4 7.0  ALBUMIN 3.8 3.5  AST 28 23  ALT 31 26  ALKPHOS 77 70  BILITOT 0.5 0.6  GFRNONAA >60 >60  GFRAA >60 >60  ANIONGAP 11 11     Hematology Recent Labs  Lab 03/06/18 1535 03/07/18 1540 03/08/18 0709  WBC 14.7* 11.8* 9.3  RBC 4.24 4.27 3.82  HGB 12.2 12.2 11.0*  HCT 36.8 37.1 33.3*  MCV 86.8 86.8 87.2  MCH 28.8 28.4 28.8  MCHC 33.2 32.8 33.1  RDW 14.8* 14.7* 14.6*  PLT 408 422 336    Cardiac Enzymes Recent Labs  Lab 03/07/18 1538 03/07/18 2023 03/08/18 0117 03/08/18 0709  TROPONINI 0.03* <0.03 <0.03 <0.03   No results for input(s): TROPIPOC in the last 168 hours.   BNPNo  results for input(s): BNP, PROBNP in the last 168 hours.   DDimer No results for input(s): DDIMER in the last 168 hours.   Radiology    Dg Chest 2 View  Result Date: 03/07/2018 IMPRESSION: No active cardiopulmonary disease. Electronically Signed   By: Kennith CenterEric  Mansell M.D.   On: 03/07/2018 17:23   Ct Angio Chest Pe W And/or Wo Contrast  Result Date: 03/07/2018 IMPRESSION: 1. No CT evidence for acute pulmonary embolus. 2. 7 mm right lower lobe pulmonary nodule. Non-contrast chest CT at 6-12 months is recommended. If the nodule is stable at time of repeat CT, then future CT at 18-24 months (from today's scan) is considered optional for low-risk patients, but is recommended for high-risk patients. This recommendation follows the consensus statement: Guidelines for Management of Incidental Pulmonary Nodules  Detected on CT Images: From the Fleischner Society 2017; Radiology 2017; 284:228-243. Electronically Signed   By: Kennith CenterEric  Mansell M.D.   On: 03/07/2018 18:37   [place] Abdomen Limited Ruq  Result Date: 03/06/2018 IMPRESSION: Cholelithiasis.  No secondary signs of acute cholecystitis. Electronically Signed   By: Mitzi HansenLance  Furusawa-Stratton M.D.   On: 03/06/2018 18:02    Cardiac Studies   TTE pending.   Patient Profile     37 y.o. female with history of recent spontaneous delivery 2 weeks prior who was [facility] with chest/abdominal pain and noted to have an abnormal EKG and an initial troponin of 0.03 with subsequent values being negative.   Assessment & Plan    1. Abnormal EKG, elevated troponin/chest pain: -Initial troponin 0.03 with subsequent values being negative -EKG at presentation with right axis deviation and inferolateral TWI -CTA negative for PE -Follow up EKG improved st/t changes this morning -Heparin gtt -Concern for possible SCAD -Discuss with MD about possible LHC -Risks and benefits of cardiac catheterization have been discussed with the patient including risks of bleeding, bruising, infection, kidney damage, stroke, heart attack, and death. The patient understands these risks and is willing to proceed with the procedure. All questions have been answered and concerns listened to  2. HTN/preeclampsia : -Improved -Labetalol   3. Cholelithiasis: -Cannot rule out as etiology of her pain as her symptoms do get worse with eating spicy foods -Outpatient follow up  For questions or updates, please contact [facility] Please consult [email or web address] for contact info under Cardiology/STEMI.    Signed, Eula Listenyan Jorey Dollard, PA-C [facility] Pager: [phone] 03/08/2018, 8:01 AM

## 2018-03-08 NOTE — Consult Note (Signed)
SURGICAL CONSULTATION NOTE (initial) - cpt: 16109  HISTORY OF PRESENT ILLNESS (HPI):  37 y.o. female 2 weeks post-partum with pre-eclampsia during pregnancy presented to [facility] ED for evaluation of upper abdominal and chest pain. Patient underwent unremarkable cardiac workup and limited RUQ abdominal ultrasound. Patient reports she's experienced post-prandial abdominal pain following fatty foods such as onion rings x 7 years, which became worse during her recent pregnancy and particularly severe over the past 2 days. She currently reports her pain has been well-controlled while in the hospital, but she expresses anxiety about going home and experiencing the pain again. She currently denies N/V, fever/chills, CP, or SOB and denies any prior open abdominal surgeries. Patient also reports a history of GERD, controlled with medication.  Surgery is consulted by medical physician Dr. Nemiah Commander in this context for evaluation and management of symptomatic cholelithiasis.  PAST MEDICAL HISTORY (PMH):  Past Medical History:  Diagnosis Date  . Asthma    has not used inhaler in years  . Breast mass x 1  month   about a 1 cm mass Left at 2 o'clock per pt  . Complication of anesthesia    difficulty voiding postop  . Family history of hemophilia 05/04/2014   patient has never been tested  . GERD (gastroesophageal reflux disease)   . Heart murmur    as child  . Hypothyroid    h/o outgrew  . PCOS (polycystic ovarian syndrome)      PAST SURGICAL HISTORY (PSH):  Past Surgical History:  Procedure Laterality Date  . CYST EXCISION  2010   head cysts x 14.  high levels of keratin  . LESION EXCISION N/A 02/16/2016   Procedure: EXCISION SCALP LESION,excision 2 large scalp cysrts with intermediate closure;  Surgeon: Kieth Brightly, MD;  Location: [facility] ORS;  Service: General;  Laterality: N/A;  . WISDOM TOOTH EXTRACTION       MEDICATIONS:  Prior to Admission medications   Medication Sig Start Date  End Date Taking? Authorizing Provider  acetaminophen (TYLENOL) 500 MG tablet Take 1,000 mg by mouth every 6 (six) hours as needed for headache.    [provider]  albuterol (PROVENTIL HFA;VENTOLIN HFA) 108 (90 BASE) MCG/ACT inhaler Inhale 2 puffs into the lungs every 4 (four) hours as needed for wheezing or shortness of breath.    [provider]  calcium carbonate (TUMS - DOSED IN MG ELEMENTAL CALCIUM) 500 MG chewable tablet Chew 1 tablet by mouth as needed for indigestion or heartburn.    [provider]  Ferrous Sulfate (IRON) 325 (65 Fe) MG TABS Take 1 tablet (325 mg total) by mouth 2 (two) times daily. 02/20/18   Farrel Conners, CNM  ibuprofen (ADVIL,MOTRIN) 600 MG tablet Take 1 tablet (600 mg total) by mouth every 6 (six) hours. 02/20/18   Farrel Conners, CNM  labetalol (NORMODYNE) 200 MG tablet Take 1 tablet (200 mg total) by mouth 2 (two) times daily. 02/25/18   Oswaldo Conroy, CNM  Prenatal Vit-Fe Fumarate-FA (PRENATAL MULTIVITAMIN) TABS tablet Take 1 tablet by mouth daily at 12 noon.    [provider]  prochlorperazine (COMPAZINE) 10 MG tablet Take 1 tablet (10 mg total) by mouth every 6 (six) hours as needed for nausea or vomiting. 03/06/18   Willy Eddy, MD  ranitidine (ZANTAC) 150 MG tablet Take 150 mg by mouth 2 (two) times daily.    [provider]     ALLERGIES:  Allergies  Allergen Reactions  . Sulfa Antibiotics Other (See  Comments)    seizures  . Levaquin [Levofloxacin In D5w] Hives  . Penicillins Hives     SOCIAL HISTORY:  Social History   Socioeconomic History  . Marital status: Married    Spouse name: Not on file  . Number of children: 1  . Years of education: Not on file  . Highest education level: Not on file  Social Needs  . Financial resource strain: Not on file  . Food insecurity - worry: Not on file  . Food insecurity - inability: Not on file  . Transportation needs - medical: Not on file  .  Transportation needs - non-medical: Not on file  Occupational History  . Not on file  Tobacco Use  . Smoking status: Never Smoker  . Smokeless tobacco: Never Used  Substance and Sexual Activity  . Alcohol use: No    Alcohol/week: 0.0 oz    Frequency: Never    Comment: wine occasionally/not while pregnant  . Drug use: No  . Sexual activity: No    Birth control/protection: None  Other Topics Concern  . Not on file  Social History Narrative  . Not on file    The patient currently resides (home / rehab facility / nursing home): Home The patient normally is (ambulatory / bedbound): Ambulatory   FAMILY HISTORY:  Family History  Problem Relation Age of Onset  . Diabetes Mother   . Asthma Mother   . Obesity Mother   . Hypertension Mother   . Hemophilia Mother   . Heart disease Father   . Heart attack Father   . Breast cancer Other   . Diabetes Maternal Grandmother        type 2  . Factor VIII deficiency Other        Also has factor 9 deficiency  . Factor VIII deficiency Other        also has factor 9 deficiency  . Hemophilia Maternal Grandfather      REVIEW OF SYSTEMS:  Constitutional: denies weight loss, fever, chills, or sweats  Eyes: denies any other vision changes, history of eye injury  ENT: denies sore throat, hearing problems  Respiratory: denies shortness of breath, wheezing  Cardiovascular: denies chest pain, palpitations  Gastrointestinal: abdominal pain, N/V, and bowel function as per HPI Genitourinary: denies burning with urination or urinary frequency Musculoskeletal: denies any other joint pains or cramps  Skin: denies any other rashes or skin discolorations  Neurological: denies any other headache, dizziness, weakness  Psychiatric: denies any other depression, anxiety   All other review of systems were negative   VITAL SIGNS:  Temp:  [98.3 F (36.8 C)-98.5 F (36.9 C)] 98.3 F (36.8 C) (03/15 0507) Pulse Rate:  [77-91] 77 (03/15 0507) Resp:   [16-18] 16 (03/15 0507) BP: (110-130)/(67-91) 110/67 (03/15 0507) SpO2:  [97 %-99 %] 98 % (03/15 0507) Weight:  [231 lb 11.2 oz (105.1 kg)-235 lb 8 oz (106.8 kg)] 231 lb 11.2 oz (105.1 kg) (03/15 0507)     Height: 5\' 3"  (160 cm) Weight: 231 lb 11.2 oz (105.1 kg) BMI (Calculated): 41.05   INTAKE/OUTPUT:  This shift: Total I/O In: -  Out: 200 [Urine:200]  Last 2 shifts: @IOLAST2SHIFTS @   PHYSICAL EXAM:  Constitutional:  -- Obese body habitus  -- Awake, alert, and oriented x3, no apparent distress Eyes:  -- Pupils equally round and reactive to light  -- No scleral icterus, B/L no occular discharge Ear, nose, throat: -- Neck is FROM WNL -- No jugular venous distension  Pulmonary:  -- No wheezes or rhales -- Equal breath sounds bilaterally -- Breathing non-labored at rest Cardiovascular:  -- S1, S2 present  -- No pericardial rubs  Gastrointestinal:  -- Abdomen soft and non-distended with mild-/moderate- RUQ abdominal tenderness to palpation, no guarding or rebound tenderness -- No abdominal masses appreciated, pulsatile or otherwise  Musculoskeletal and Integumentary:  -- Wounds or skin discoloration: None appreciated -- Extremities: B/L UE and LE FROM, hands and feet warm, no edema  Neurologic:  -- Motor function: Intact and symmetric -- Sensation: Intact and symmetric Psychiatric:  -- Mood and affect WNL  Labs:  CBC Latest Ref Rng & Units 03/08/2018 03/07/2018 03/06/2018  WBC 3.6 - 11.0 K/uL 9.3 11.8(H) 14.7(H)  Hemoglobin 12.0 - 16.0 g/dL 11.0(L) 12.2 12.2  Hematocrit 35.0 - 47.0 % 33.3(L) 37.1 36.8  Platelets 150 - 440 K/uL 336 422 408   CMP Latest Ref Rng & Units 03/07/2018 03/06/2018 02/17/2018  Glucose 65 - 99 mg/dL 161(W132(H) 960(A119(H) 540(J127(H)  BUN 6 - 20 mg/dL 14 13 9   Creatinine 0.44 - 1.00 mg/dL 8.110.75 9.140.70 7.820.49  Sodium 135 - 145 mmol/L 138 137 137  Potassium 3.5 - 5.1 mmol/L 3.7 4.0 3.6  Chloride 101 - 111 mmol/L 106 104 107  CO2 22 - 32 mmol/L 21(L) 22 21(L)  Calcium  8.9 - 10.3 mg/dL 8.3(L) 8.7(L) 8.7(L)  Total Protein 6.5 - 8.1 g/dL 7.0 7.4 6.6  Total Bilirubin 0.3 - 1.2 mg/dL 0.6 0.5 0.3  Alkaline Phos 38 - 126 U/L 70 77 106  AST 15 - 41 U/L 23 28 24   ALT 14 - 54 U/L 26 31 15    Imaging studies:  Limited RUQ Abdominal Ultrasound (03/06/2018) Multiple mobile gallstones measuring up to 6.3 mm. No  gallbladder wall thickening or pericholecystic fluid. Negative sonographic Murphy's sign. Common bile duct: 3 mm.  Assessment/Plan: (ICD-10's: 63K80.20) 37 y.o. female with symptomatic cholelithiasis, complicated by pertinent comorbidities including morbid obesity (BMI >41), asthma, PCOS, hypothyroidism, GERD, and recent childbirth.   - NPO, IV fluids   - all risks, benefits, and alternatives to cholecystectomy were discussed with the patient and her family, all of their questions were answered to their expressed satisfaction, patient expresses she wishes to proceed, and informed consent was obtained.   - will plan for laparoscopic cholecystectomy pending OR availability   - medical management of comorbidities per primary medical team  - DVT prophylaxis, ambulation encouraged  All of the above findings and recommendations were discussed with the patient and her family, and all of patient's and her family's questions were answered to their expressed satisfaction.  Thank you for the opportunity to participate in this patient's care.   -- Scherrie GerlachJason E. Earlene Plateravis, MD, RPVI [place]: [facility] - Partnering for exceptional care. Office: [phone]

## 2018-03-08 NOTE — Progress Notes (Signed)
ANTICOAGULATION CONSULT NOTE - Initial Consult  Pharmacy Consult for heparin gtt Indication: chest pain/ACS  Allergies  Allergen Reactions  . Sulfa Antibiotics Other (See Comments)    seizures  . Levaquin [Levofloxacin In D5w] Hives  . Penicillins Hives    Patient Measurements: Height: 5\' 3"  (160 cm) Weight: 235 lb 8 oz (106.8 kg) IBW/kg (Calculated) : 52.4 Heparin Dosing Weight: 77.7kg  Vital Signs: Temp: 98.5 F (36.9 C) (03/14 1957) Temp Source: Oral (03/14 1957) BP: 129/81 (03/14 1957) Pulse Rate: 86 (03/14 1957)  Labs: Recent Labs    03/06/18 1535 03/07/18 1538 03/07/18 1540 03/07/18 2023 03/08/18 0117  HGB 12.2  --  12.2  --   --   HCT 36.8  --  37.1  --   --   PLT 408  --  422  --   --   APTT  --   --  35  --   --   LABPROT  --   --  13.6  --   --   INR  --   --  1.05  --   --   HEPARINUNFRC  --   --   --   --  <0.10*  CREATININE 0.70  --  0.75  --   --   TROPONINI  --  0.03*  --  <0.03 <0.03    Estimated Creatinine Clearance: 113.9 mL/min (by C-G formula based on SCr of 0.75 mg/dL).   Medical History: Past Medical History:  Diagnosis Date  . Asthma    has not used inhaler in years  . Breast mass x 1  month   about a 1 cm mass Left at 2 o'clock per pt  . Complication of anesthesia    difficulty voiding postop  . Family history of hemophilia 05/04/2014   patient has never been tested  . GERD (gastroesophageal reflux disease)   . Heart murmur    as child  . Hypothyroid    h/o outgrew  . PCOS (polycystic ovarian syndrome)     Medications:  Medications Prior to Admission  Medication Sig Dispense Refill Last Dose  . acetaminophen (TYLENOL) 500 MG tablet Take 1,000 mg by mouth every 6 (six) hours as needed for headache.   Taking  . albuterol (PROVENTIL HFA;VENTOLIN HFA) 108 (90 BASE) MCG/ACT inhaler Inhale 2 puffs into the lungs every 4 (four) hours as needed for wheezing or shortness of breath.   Taking  . calcium carbonate (TUMS - DOSED IN MG  ELEMENTAL CALCIUM) 500 MG chewable tablet Chew 1 tablet by mouth as needed for indigestion or heartburn.   Taking  . Ferrous Sulfate (IRON) 325 (65 Fe) MG TABS Take 1 tablet (325 mg total) by mouth 2 (two) times daily. 30 each 1 Taking  . ibuprofen (ADVIL,MOTRIN) 600 MG tablet Take 1 tablet (600 mg total) by mouth every 6 (six) hours. 30 tablet 0 Taking  . labetalol (NORMODYNE) 200 MG tablet Take 1 tablet (200 mg total) by mouth 2 (two) times daily. 60 tablet 2 Taking  . Prenatal Vit-Fe Fumarate-FA (PRENATAL MULTIVITAMIN) TABS tablet Take 1 tablet by mouth daily at 12 noon.   Taking  . prochlorperazine (COMPAZINE) 10 MG tablet Take 1 tablet (10 mg total) by mouth every 6 (six) hours as needed for nausea or vomiting. 12 tablet 0   . ranitidine (ZANTAC) 150 MG tablet Take 150 mg by mouth 2 (two) times daily.   Taking   Scheduled:  . aspirin EC  81 mg Oral  Daily  . ferrous sulfate  325 mg Oral BID  . heparin  2,300 Units Intravenous Once  . labetalol  100 mg Oral BID  . pantoprazole  40 mg Oral BID AC  . prenatal multivitamin  1 tablet Oral Q1200   Infusions:  . heparin 900 Units/hr (03/07/18 2019)   PRN: acetaminophen **OR** acetaminophen, albuterol, nitroGLYCERIN, ondansetron **OR** ondansetron (ZOFRAN) IV, oxyCODONE, polyethylene glycol Anti-infectives (From admission, onward)   None      Assessment: 37 year old female with ACS requiring heparin gtt per pharmacy protocol.   Goal of Therapy:  Heparin level 0.3-0.7 units/ml Monitor platelets by anticoagulation protocol: Yes   Plan:  Give 4000 units bolus x 1 Start heparin infusion at 900 units/hr Check anti-Xa level in 6 hours and daily while on heparin Continue to monitor H&H and platelets   03/15 @ 0100 HL < 0.10 subtherapeutic. Will rebolus w/ heparin 2300 units IV x 1 and increase rate to 1050 units/hr and recheck HL @ 0900.   Thomasene Ripple, PharmD, BCPS Clinical Pharmacist 03/08/2018

## 2018-03-08 NOTE — Progress Notes (Signed)
[employer] [facility] - [place] at [facility]   PATIENT NAME: Kimberly Mcfarland    MR#:  [id number]  DATE OF BIRTH:  [birth year]  SUBJECTIVE:  CHIEF COMPLAINT:   Chief Complaint  Patient presents with  . Abdominal Pain   - still has abdominal pain, no chest pain -   REVIEW OF SYSTEMS:  Review of Systems  Constitutional: Negative for chills, fever and malaise/fatigue.  HENT: Negative for hearing loss.   Eyes: Negative for blurred vision and double vision.  Respiratory: Negative for cough, shortness of breath and wheezing.   Cardiovascular: Negative for chest pain, palpitations and leg swelling.  Gastrointestinal: Positive for abdominal pain and nausea. Negative for constipation, diarrhea and vomiting.  Genitourinary: Negative for dysuria.  Musculoskeletal: Negative for myalgias.  Neurological: Negative for dizziness, speech change, focal weakness, seizures and headaches.  Psychiatric/Behavioral: Negative for depression.    DRUG ALLERGIES:   Allergies  Allergen Reactions  . Sulfa Antibiotics Other (See Comments)    seizures  . Levaquin [Levofloxacin In D5w] Hives  . Penicillins Hives    VITALS:  Blood pressure 110/67, pulse 77, temperature 98.3 F (36.8 C), temperature source Oral, resp. rate 16, height 5\' 3"  (1.6 m), weight 105.1 kg (231 lb 11.2 oz), SpO2 98 %, unknown if currently breastfeeding.  PHYSICAL EXAMINATION:  Physical Exam  GENERAL:  37 y.o.-year-old patient lying in the bed with no acute distress.  EYES: Pupils equal, round, reactive to light and accommodation. No scleral icterus. Extraocular muscles intact.  HEENT: Head atraumatic, normocephalic. Oropharynx and nasopharynx clear.  Several cysts on her scalp- a huge one along  the anterior hair line NECK:  Supple, no jugular venous distention. No thyroid enlargement, no tenderness.  LUNGS: Normal breath sounds bilaterally, no wheezing, rales,rhonchi or crepitation. No use of accessory muscles of  respiration.  CARDIOVASCULAR: S1, S2 normal. No murmurs, rubs, or gallops.  ABDOMEN: Soft, tender on mid abdominal palpation, nondistended. Bowel sounds present. No organomegaly or mass.  EXTREMITIES: No pedal edema, cyanosis, or clubbing.  NEUROLOGIC: Cranial nerves II through XII are intact. Muscle strength 5/5 in all extremities. Sensation intact. Gait not checked.  PSYCHIATRIC: The patient is alert and oriented x 3.  SKIN: No obvious rash, lesion, or ulcer.    LABORATORY PANEL:   CBC Recent Labs  Lab 03/08/18 0709  WBC 9.3  HGB 11.0*  HCT 33.3*  PLT 336   ------------------------------------------------------------------------------------------------------------------  Chemistries  Recent Labs  Lab 03/07/18 1540  NA 138  K 3.7  CL 106  CO2 21*  GLUCOSE 132*  BUN 14  CREATININE 0.75  CALCIUM 8.3*  AST 23  ALT 26  ALKPHOS 70  BILITOT 0.6   ------------------------------------------------------------------------------------------------------------------  Cardiac Enzymes Recent Labs  Lab 03/08/18 0709  TROPONINI <0.03   ------------------------------------------------------------------------------------------------------------------  RADIOLOGY:  Dg Chest 2 View  Result Date: 03/07/2018 CLINICAL DATA:  Chest pain EXAM: CHEST - 2 VIEW COMPARISON:  01/29/2016 FINDINGS: The heart size and mediastinal contours are within normal limits. Both lungs are clear. The visualized skeletal structures are unremarkable. IMPRESSION: No active cardiopulmonary disease. Electronically Signed   By: Kennith Center M.D.   On: 03/07/2018 17:23   Ct Angio Chest Pe W And/or Wo Contrast  Result Date: 03/07/2018 CLINICAL DATA:  2 weeks postpartum. High pretest probability for pulmonary embolus. EXAM: CT ANGIOGRAPHY CHEST WITH CONTRAST TECHNIQUE: Multidetector CT imaging of the chest was performed using the standard protocol during bolus administration of intravenous contrast. Multiplanar  CT image reconstructions and MIPs  were obtained to evaluate the vascular anatomy. CONTRAST:  75mL ISOVUE-370 IOPAMIDOL (ISOVUE-370) INJECTION 76% COMPARISON:  None. FINDINGS: Cardiovascular: The heart size is normal. No pericardial effusion. No thoracic aortic aneurysm. No dissection of the thoracic aorta. No evidence for filling defect in the opacified pulmonary arteries to suggest the presence of an acute pulmonary embolus. Mediastinum/Nodes: No mediastinal lymphadenopathy. There is no hilar lymphadenopathy. The esophagus has normal imaging features. There is no axillary lymphadenopathy. Lungs/Pleura: 5 mm a triangle-shaped nodule along the minor fissure (image 47 series 7) is compatible with subpleural lymph node. 7 mm pulmonary nodule identified right lung (image 40/7). No pulmonary edema. No focal airspace consolidation. No pleural effusion. Upper Abdomen: Unremarkable. Musculoskeletal: Bone windows reveal no worrisome lytic or sclerotic osseous lesions. Review of the MIP images confirms the above findings. IMPRESSION: 1. No CT evidence for acute pulmonary embolus. 2. 7 mm right lower lobe pulmonary nodule. Non-contrast chest CT at 6-12 months is recommended. If the nodule is stable at time of repeat CT, then future CT at 18-24 months (from today's scan) is considered optional for low-risk patients, but is recommended for high-risk patients. This recommendation follows the consensus statement: Guidelines for Management of Incidental Pulmonary Nodules Detected on CT Images: From the Fleischner Society 2017; Radiology 2017; 284:228-243. Electronically Signed   By: Kennith CenterEric  Mansell M.D.   On: 03/07/2018 18:37   [place] Abdomen Limited Ruq  Result Date: 03/06/2018 CLINICAL DATA:  37 y/o F; epigastric and right upper quadrant abdominal pain for 1 day. 2-1/2 weeks postpartum. EXAM: ULTRASOUND ABDOMEN LIMITED RIGHT UPPER QUADRANT COMPARISON:  None. FINDINGS: Gallbladder: Multiple mobile gallstones measuring up to 6.3 mm.  No gallbladder wall thickening or pericholecystic fluid. Negative sonographic Murphy's sign. Common bile duct: Diameter: 3 mm Liver: No focal lesion identified. Within normal limits in parenchymal echogenicity. Portal vein is patent on color Doppler imaging with normal direction of blood flow towards the liver. IMPRESSION: Cholelithiasis.  No secondary signs of acute cholecystitis. Electronically Signed   By: Mitzi HansenLance  Furusawa-Stratton M.D.   On: 03/06/2018 18:02    EKG:   Orders placed or performed during the hospital encounter of 03/07/18  . EKG 12-Lead  . EKG 12-Lead  . EKG 12-Lead  . EKG 12-Lead  . EKG 12-Lead  . EKG 12-Lead  . EKG 12-Lead  . EKG 12-Lead  . EKG 12-Lead  . EKG 12-Lead    ASSESSMENT AND PLAN:   37 year old female with past medical history significant for preeclampsia during recent delivery 2 weeks ago with high blood pressure, presents to hospital secondary to right shoulder pain and abdominal pain.  1. Abdominal pain and right shoulder pain-likely secondary to gallbladder disease. -Pain consistently starts after eating. There were some concerning EKG changes however cardiology feels it could be secondary to lead inversion. -CT angiogram has been negative for pulmonary embolism -An ultrasound abdomen confirming cholelithiasis.- -Troponins x 3 negative, echocardiogram has been normal. -surgery consult today and likely plan for laparoscopic cholecystectomy today. -Patient will be transferred to surgical service after disorder. We'll sign off. Please consult if any medical input needed. -Discontinue IV heparin -Pain medications. Continue nothing by mouth status for now  2. Anemia-postpartum anemia. Continue iron supplements and prenatal vitamins  3. GERD-on Protonix  4. Hypertension-on labetalol  Encourage ambulation after surgery   All the records are reviewed and case discussed with Care Management/Social Workerr. Management plans discussed with the patient,  family and they are in agreement.  CODE STATUS: Full Code  TOTAL TIME TAKING  CARE OF THIS PATIENT: 38 minutes.   POSSIBLE D/C IN 1-2 DAYS, DEPENDING ON CLINICAL CONDITION.   Enid Baas M.D on 03/08/2018 at 2:03 PM  Between 7am to 6pm - Pager - [phone]  After 6pm go to [email or web address] - password [email or web address]  [facility]  Office  [phone]  CC: Primary care physician; Patient, No Pcp Per

## 2018-03-08 NOTE — Anesthesia Procedure Notes (Signed)
Procedure Name: Intubation Date/Time: 03/08/2018 4:45 PM Performed by: Allean Found, CRNA Pre-anesthesia Checklist: Patient identified, Emergency Drugs available, Suction available, Patient being monitored and Timeout performed Patient Re-evaluated:Patient Re-evaluated prior to induction Oxygen Delivery Method: Circle system utilized Preoxygenation: Pre-oxygenation with 100% oxygen Induction Type: IV induction Ventilation: Mask ventilation without difficulty Laryngoscope Size: 3, Mac and McGraph Grade View: Grade II Tube type: Oral Tube size: 7.0 mm Number of attempts: 2 Airway Equipment and Method: Stylet Placement Confirmation: ETT inserted through vocal cords under direct vision,  positive ETCO2 and breath sounds checked- equal and bilateral Secured at: 21 cm Tube secured with: Tape Dental Injury: Teeth and Oropharynx as per pre-operative assessment

## 2018-03-08 NOTE — Anesthesia Postprocedure Evaluation (Signed)
Anesthesia Post Note  Patient: Kimberly Mcfarland  Procedure(s) Performed: LAPAROSCOPIC CHOLECYSTECTOMY (N/A )  Patient location during evaluation: PACU Anesthesia Type: General Level of consciousness: awake and alert Pain management: pain level controlled Vital Signs Assessment: post-procedure vital signs reviewed and stable Respiratory status: spontaneous breathing, nonlabored ventilation, respiratory function stable and patient connected to nasal cannula oxygen Cardiovascular status: blood pressure returned to baseline and stable Postop Assessment: no apparent nausea or vomiting Anesthetic complications: no     Last Vitals:  Vitals:   03/08/18 1924 03/08/18 2008  BP: 120/72 115/62  Pulse: 66 72  Resp: 19 18  Temp: (!) 36.3 C 36.9 C  SpO2: 97% 97%    Last Pain:  Vitals:   03/08/18 2123  TempSrc:   PainSc: 3                  Melissa Pulido S

## 2018-03-09 ENCOUNTER — Encounter: Payer: Self-pay | Admitting: Surgery

## 2018-03-09 DIAGNOSIS — K219 Gastro-esophageal reflux disease without esophagitis: Secondary | ICD-10-CM | POA: Diagnosis not present

## 2018-03-09 DIAGNOSIS — O165 Unspecified maternal hypertension, complicating the puerperium: Secondary | ICD-10-CM | POA: Diagnosis not present

## 2018-03-09 DIAGNOSIS — R1013 Epigastric pain: Secondary | ICD-10-CM | POA: Diagnosis not present

## 2018-03-09 DIAGNOSIS — O9081 Anemia of the puerperium: Secondary | ICD-10-CM | POA: Diagnosis not present

## 2018-03-09 DIAGNOSIS — O9953 Diseases of the respiratory system complicating the puerperium: Secondary | ICD-10-CM | POA: Diagnosis not present

## 2018-03-09 DIAGNOSIS — O9963 Diseases of the digestive system complicating the puerperium: Secondary | ICD-10-CM | POA: Diagnosis not present

## 2018-03-09 DIAGNOSIS — O26893 Other specified pregnancy related conditions, third trimester: Secondary | ICD-10-CM | POA: Diagnosis not present

## 2018-03-09 DIAGNOSIS — J45909 Unspecified asthma, uncomplicated: Secondary | ICD-10-CM | POA: Diagnosis not present

## 2018-03-09 DIAGNOSIS — K8010 Calculus of gallbladder with chronic cholecystitis without obstruction: Secondary | ICD-10-CM | POA: Diagnosis not present

## 2018-03-09 LAB — CBC
HCT: 30.5 % — ABNORMAL LOW (ref 35.0–47.0)
Hemoglobin: 9.9 g/dL — ABNORMAL LOW (ref 12.0–16.0)
MCH: 28.6 pg (ref 26.0–34.0)
MCHC: 32.4 g/dL (ref 32.0–36.0)
MCV: 88 fL (ref 80.0–100.0)
PLATELETS: 353 10*3/uL (ref 150–440)
RBC: 3.47 MIL/uL — ABNORMAL LOW (ref 3.80–5.20)
RDW: 14.5 % (ref 11.5–14.5)
WBC: 12.1 10*3/uL — AB (ref 3.6–11.0)

## 2018-03-09 MED ORDER — OXYCODONE-ACETAMINOPHEN 5-325 MG PO TABS: 1.0000 | ORAL_TABLET | ORAL | 0 refills | Status: DC | PRN

## 2018-03-09 NOTE — Discharge Summary (Signed)
Physician Discharge Summary  Patient ID: TASHAY BOZICH MRN: [id number] DOB/AGE: 1981/07/02 37 y.o.  Admit date: 03/07/2018 Discharge date: 03/09/2018  Admission Diagnoses:  Discharge Diagnoses:  Active Problems:   Abdominal pain   Discharged Condition: good  Hospital Course: 37 y.o. female 2 weeks post-partum with pre-eclampsia during pregnancy presented to [facility] ED for evaluation of upper abdominal and chest pain. Patient underwent unremarkable cardiac workup and limited RUQ abdominal ultrasound, which demonstrated cholelithiasis. Patient reportsshe's experienced post-prandial abdominal pain after fatty foods such as onion rings x 7 years, which became worse during her recent pregnancy and became severe over the past 2 days. She reports her pain has been controlled while in the hospital, but she expressed anxiety about going home and experiencing such pain again. Accordingly, informed consent was obtained and documented, and patient underwent uneventful laparoscopic cholecystectomy Earlene Plater, 03/08/2018).  Post-operatively, patient's pain improved/resolved and advancement of patient's diet and ambulation were well-tolerated. The remainder of patient's hospital course was essentially unremarkable, and discharge planning was initiated with patient safely able to be discharged home with appropriate discharge instructions, pain control, and outpatient surgical follow-up after all of her and her husband's questions were answered to their expressed satisfaction.  Consults: cardiology and general surgery  Significant Diagnostic Studies: radiology: Ultrasound: cholelithiasis without sonographic evidence of cholecystitis  Treatments: IV hydration and surgery: laparoscopic cholecystectomy Earlene Plater, 03/08/2018)  Discharge Exam: Blood pressure (!) 103/59, pulse 71, temperature 98.3 F (36.8 C), temperature source Oral, resp. rate 18, height 5\' 3"  (1.6 m), weight 238 lb 11.2 oz (108.3 kg), SpO2 98 %, unknown  if currently breastfeeding. General appearance: alert, cooperative and no distress GI: abdomen soft and non-distended with moderate epigastric peri-incisional tenderness to palpation, incisions well-approximated without erythema or drainage  Disposition: Discharge disposition: 01-Home or Self Care        Allergies as of 03/09/2018      Reactions   Sulfa Antibiotics Other (See Comments)   seizures   Levaquin [levofloxacin In D5w] Hives   Penicillins Hives      Medication List    TAKE these medications   acetaminophen 500 MG tablet Commonly known as:  TYLENOL Take 1,000 mg by mouth every 6 (six) hours as needed for headache.   albuterol 108 (90 Base) MCG/ACT inhaler Commonly known as:  PROVENTIL HFA;VENTOLIN HFA Inhale 2 puffs into the lungs every 4 (four) hours as needed for wheezing or shortness of breath.   calcium carbonate 500 MG chewable tablet Commonly known as:  TUMS - dosed in mg elemental calcium Chew 1 tablet by mouth as needed for indigestion or heartburn.   ibuprofen 600 MG tablet Commonly known as:  ADVIL,MOTRIN Take 1 tablet (600 mg total) by mouth every 6 (six) hours.   Iron 325 (65 Fe) MG Tabs Take 1 tablet (325 mg total) by mouth 2 (two) times daily.   labetalol 200 MG tablet Commonly known as:  NORMODYNE Take 1 tablet (200 mg total) by mouth 2 (two) times daily.   oxyCODONE-acetaminophen 5-325 MG tablet Commonly known as:  PERCOCET/ROXICET Take 1-2 tablets by mouth every 4 (four) hours as needed for severe pain.   prenatal multivitamin Tabs tablet Take 1 tablet by mouth daily at 12 noon.   prochlorperazine 10 MG tablet Commonly known as:  COMPAZINE Take 1 tablet (10 mg total) by mouth every 6 (six) hours as needed for nausea or vomiting.   ranitidine 150 MG tablet Commonly known as:  ZANTAC Take 150 mg by mouth 2 (two) times  daily.      Follow-up Information    [facility] [place]. Schedule an appointment as soon as  possible for a visit in 2 week(s).   Specialty:  General Surgery Contact information: [address] [place] [phone]          Signed: Ancil LinseyJason Evan Jawanna Dykman 03/09/2018, 4:27 PM

## 2018-03-12 ENCOUNTER — Telehealth: Payer: Self-pay | Admitting: Surgery

## 2018-03-12 LAB — SURGICAL PATHOLOGY

## 2018-03-12 NOTE — Telephone Encounter (Signed)
Left message for patient to call to schedule post op with Earlene Plateravis.

## 2018-03-15 ENCOUNTER — Telehealth: Payer: Self-pay | Admitting: Lactation Services

## 2018-03-15 NOTE — Telephone Encounter (Signed)
Kimberly NightingaleMary Mcfarland was supposed to come in for out patient lactation consult March 16.  Mom had gallbladder surgery.  Dr. Earnest ConroyFlores still wants her to follow up in 2 weeks.  Mom reports baby, Kimberly AmorGage Mcfarland, is doing well and gaining weight now.

## 2018-03-17 DIAGNOSIS — K8000 Calculus of gallbladder with acute cholecystitis without obstruction: Secondary | ICD-10-CM

## 2018-03-25 ENCOUNTER — Encounter: Payer: Self-pay | Admitting: Surgery

## 2018-03-25 ENCOUNTER — Ambulatory Visit (INDEPENDENT_AMBULATORY_CARE_PROVIDER_SITE_OTHER): Payer: 59 | Admitting: Surgery

## 2018-03-25 VITALS — BP 131/82 | HR 73 | Temp 97.7°F | Ht 63.0 in | Wt 230.0 lb

## 2018-03-25 DIAGNOSIS — Z4889 Encounter for other specified surgical aftercare: Secondary | ICD-10-CM

## 2018-03-25 DIAGNOSIS — K8020 Calculus of gallbladder without cholecystitis without obstruction: Secondary | ICD-10-CM

## 2018-03-25 DIAGNOSIS — L723 Sebaceous cyst: Secondary | ICD-10-CM

## 2018-03-25 NOTE — Patient Instructions (Addendum)
Please give us a call whenever you are ready to have your cyst removed.   GENERAL POST-OPERATIVE PATIENT INSTRUCTIONS   WOUND CARE INSTRUCTIONS:  Keep a dry clean dressing on the wound if there is drainage. The initial bandage may be removed after 24 hours.  Once the wound has quit draining you may leave it open to air.  If clothing rubs against the wound or causes irritation and the wound is not draining you may cover it with a dry dressing during the daytime.  Try to keep the wound dry and avoid ointments on the wound unless directed to do so.  If the wound becomes bright red and painful or starts to drain infected material that is not clear, please contact your physician immediately.  If the wound is mildly pink and has a thick firm ridge underneath it, this is normal, and is referred to as a healing ridge.  This will resolve over the next 4-6 weeks.  BATHING: You may shower if you have been informed of this by your surgeon. However, Please do not submerge in a tub, hot tub, or pool until incisions are completely sealed or have been told by your surgeon that you may do so.  DIET:  You may eat any foods that you can tolerate.  It is a good idea to eat a high fiber diet and take in plenty of fluids to prevent constipation.  If you do become constipated you may want to take a mild laxative or take ducolax tablets on a daily basis until your bowel habits are regular.  Constipation can be very uncomfortable, along with straining, after recent surgery.  ACTIVITY:  You are encouraged to cough and deep breath or use your incentive spirometer if you were given one, every 15-30 minutes when awake.  This will help prevent respiratory complications and low grade fevers post-operatively if you had a general anesthetic.  You may want to hug a pillow when coughing and sneezing to add additional support to the surgical area, if you had abdominal or chest surgery, which will decrease pain during these times.  You are  encouraged to walk and engage in light activity for the next two weeks.  You should not lift more than 20 pounds, until 04/05/2018 as it could put you at increased risk for complications.  Twenty pounds is roughly equivalent to a plastic bag of groceries. At that time- Listen to your body when lifting, if you have pain when lifting, stop and then try again in a few days. Soreness after doing exercises or activities of daily living is normal as you get back in to your normal routine.  MEDICATIONS:  Try to take narcotic medications and anti-inflammatory medications, such as tylenol, ibuprofen, naprosyn, etc., with food.  This will minimize stomach upset from the medication.  Should you develop nausea and vomiting from the pain medication, or develop a rash, please discontinue the medication and contact your physician.  You should not drive, make important decisions, or operate machinery when taking narcotic pain medication.  SUNBLOCK Use sun block to incision area over the next year if this area will be exposed to sun. This helps decrease scarring and will allow you avoid a permanent darkened area over your incision.  QUESTIONS:  Please feel free to call our office if you have any questions, and we will be glad to assist you. [phone]

## 2018-03-25 NOTE — Progress Notes (Signed)
Surgical Clinic Progress/Follow-up Note   HPI:  37 y.o. Female presents to clinic for post-op follow-up evaluation s/p laparoscopic cholecystectomy for symptomatic cholelithiasis. Patient reports complete resolution of pre-operative post-prandial RUQ abdominal pain and has been tolerating increasingly regular diet with +flatus and normal BM's, some looser BM's with more fatty foods, denies N/V, fever/chills, CP, or SOB.  Patient, however, complains of large and painful masses on her scalp that she says she's previously been told are sebaceous cysts. She's had some of these since she was 37 years old, has had several excised for pain and infection, had one incised and drained by Dr. Evette Cristal early in her recent pregnancy, and was scheduled for evaluation to have the most symptomatic one(s) excised by Dr. Evette Cristal until she became pregnant (recently post-partum) and any further discussion/planning was deferred.  Review of Systems:  Constitutional: denies any other weight loss, fever, chills, or sweats  Eyes: denies any other vision changes, history of eye injury  ENT: denies sore throat, hearing problems  Respiratory: denies shortness of breath, wheezing  Cardiovascular: denies chest pain, palpitations  Gastrointestinal: abdominal pain, N/V, and bowel function as per HPI Musculoskeletal: denies any other joint pains or cramps  Skin: Denies any other rashes or skin discolorations except as per HPI  Neurological: denies any other headache, dizziness, weakness  Psychiatric: denies any other depression, anxiety  All other review of systems: otherwise negative   Vital Signs:  BP 131/82   Pulse 73   Temp 97.7 F (36.5 C) (Oral)   Ht 5\' 3"  (1.6 m)   Wt 230 lb (104.3 kg)   LMP  (LMP Unknown) Comment: neg preg test 03/07/18  BMI 40.74 kg/m    Physical Exam:  Constitutional:  -- Normal body habitus  -- Awake, alert, and oriented x3  Eyes:  -- Pupils equally round and reactive to light  -- No  scleral icterus  Ear, nose, throat:  -- No jugular venous distension  -- No nasal drainage, bleeding Pulmonary:  -- No crackles -- Equal breath sounds bilaterally -- Breathing non-labored at rest Cardiovascular:  -- S1, S2 present  -- No pericardial rubs  Gastrointestinal:  -- Soft, nontender, non-distended, no guarding/rebound -- Incisions well-approximated without surrounding erythema or drainage -- No abdominal masses appreciated, pulsatile or otherwise  Musculoskeletal / Integumentary:  -- Wounds or skin discoloration: None appreciated except post-surgical wounds as described above (GI) and several large variable-sized tender masses consistent with very large sebaceous cysts, most prominently at the center of her forehead hair line and Right parietal region, currently without erythema or drainage -- Extremities: B/L UE and LE FROM, hands and feet warm, no edema  Neurologic:  -- Motor function: intact and symmetric  -- Sensation: intact and symmetric   Assessment:  37 y.o. yo Female with a problem list including...  Patient Active Problem List   Diagnosis Date Noted  . Calculus of gallbladder with acute cholecystitis without obstruction   . Abdominal pain 03/07/2018  . Postpartum care following vaginal delivery 02/20/2018  . Preeclampsia 02/17/2018  . Antepartum mild preeclampsia 02/13/2018  . Family history of hemophilia 02/13/2018  . Obesity in pregnancy 02/13/2018  . Elevated blood pressure affecting pregnancy in third trimester, antepartum 02/05/2018  . Palpitations 01/26/2018  . Advanced maternal age in multigravida 09/12/2017  . Supervision of high-risk pregnancy of elderly multigravida (>= 63 years old at time of delivery) 09/12/2017  . Hx of preeclampsia, prior pregnancy, currently pregnant 09/12/2017    presents to clinic for post-op  follow-up evaluation, doing well s/p laparoscopic cholecystectomy for symptomatic cholelithiasis with also symptomatic very large  multiple scalp sebaceous cysts.  Plan:              - advance diet as tolerated              - okay to submerge incisions under water (baths, swimming) prn             - gradually resume all activities without restrictions over next 2 weeks             - apply sunblock particularly to incisions with sun exposure to reduce pigmentation of scars  - all risks, benefits, and alternatives to excision of very large chronic and symptomatic previously infected likely scalp sebaceous cysts were discussed with the patient and her husband, all of their questions were answered to their expressed satisfaction, patient expresses she wishes to proceed, and informed consent was accordingly obtained at that time.  - patient says she will call soon to schedule cystectomy x 1 - 2 in OR due to size and location             - instructed to call office if any questions or concerns   All of the above recommendations were discussed with the patient and patient's family, and all of patient's and family's questions were answered to their expressed satisfaction.  -- Scherrie GerlachJason E. Earlene Plateravis, MD, RPVI [place]: [facility] - Partnering for exceptional care. Office: [phone]

## 2018-03-28 ENCOUNTER — Ambulatory Visit (INDEPENDENT_AMBULATORY_CARE_PROVIDER_SITE_OTHER): Payer: 59 | Admitting: Obstetrics and Gynecology

## 2018-03-28 ENCOUNTER — Encounter: Payer: Self-pay | Admitting: Obstetrics and Gynecology

## 2018-03-28 DIAGNOSIS — Z30017 Encounter for initial prescription of implantable subdermal contraceptive: Secondary | ICD-10-CM

## 2018-03-28 MED ORDER — ETONOGESTREL 68 MG ~~LOC~~ IMPL: 1.0000 | DRUG_IMPLANT | Freq: Once | SUBCUTANEOUS | 0 refills | Status: AC

## 2018-03-28 NOTE — Progress Notes (Signed)
Postpartum Visit   Chief Complaint  Patient presents with  . 6 week post partum   History of Present Illness: Patient is a 37 y.o. [id number] presents for postpartum visit.  Date of delivery: 02/18/2018 Type of delivery: Vaginal delivery - Vacuum or forceps assisted  no Episiotomy No.  Laceration: no Pregnancy or labor problems:  preeclampsia Any problems since the delivery:  Cholecystitis - s/p cholecystectomy   Newborn Details:  SINGLETON :  1. Birth weight: 7.3, female Maternal Details:  Breast Feeding:  yes Post partum depression/anxiety noted:  no Edinburgh Post-Partum Depression Score:  4  Date of last PAP: 08/15/2017/ASCUS (HPV negative)  Past Medical History:  Diagnosis Date  . Asthma    has not used inhaler in years  . Breast mass x 1  month   about a 1 cm mass Left at 2 o'clock per pt  . Complication of anesthesia    difficulty voiding postop  . Family history of hemophilia 05/04/2014   patient has never been tested  . GERD (gastroesophageal reflux disease)   . Heart murmur    as child  . Hypothyroid    h/o outgrew  . PCOS (polycystic ovarian syndrome)     Past Surgical History:  Procedure Laterality Date  . CHOLECYSTECTOMY N/A 03/08/2018   Procedure: LAPAROSCOPIC CHOLECYSTECTOMY;  Surgeon: Ancil Linsey, MD;  Location: [facility] ORS;  Service: General;  Laterality: N/A;  . CYST EXCISION  2010   head cysts x 14.  high levels of keratin  . LESION EXCISION N/A 02/16/2016   Procedure: EXCISION SCALP LESION,excision 2 large scalp cysrts with intermediate closure;  Surgeon: Kieth Brightly, MD;  Location: [facility] ORS;  Service: General;  Laterality: N/A;  . WISDOM TOOTH EXTRACTION      Prior to Admission medications   Medication Sig Start Date End Date Taking? Authorizing Provider  acetaminophen (TYLENOL) 500 MG tablet Take 1,000 mg by mouth every 6 (six) hours as needed for headache.    [provider]  albuterol (PROVENTIL HFA;VENTOLIN HFA) 108 (90  BASE) MCG/ACT inhaler Inhale 2 puffs into the lungs every 4 (four) hours as needed for wheezing or shortness of breath.    [provider]  calcium carbonate (TUMS - DOSED IN MG ELEMENTAL CALCIUM) 500 MG chewable tablet Chew 1 tablet by mouth as needed for indigestion or heartburn.    [provider]  labetalol (NORMODYNE) 200 MG tablet Take 1 tablet (200 mg total) by mouth 2 (two) times daily. 02/25/18   Oswaldo Conroy, CNM  Prenatal Vit-Fe Fumarate-FA (PRENATAL MULTIVITAMIN) TABS tablet Take 1 tablet by mouth daily at 12 noon.    [provider]  prochlorperazine (COMPAZINE) 10 MG tablet Take 1 tablet (10 mg total) by mouth every 6 (six) hours as needed for nausea or vomiting. 03/06/18   Willy Eddy, MD  ranitidine (ZANTAC) 150 MG tablet Take 150 mg by mouth 2 (two) times daily.    [provider]    Allergies  Allergen Reactions  . Adhesive [Tape] Hives  . Sulfa Antibiotics Other (See Comments)    seizures  . Levaquin [Levofloxacin In D5w] Hives  . Penicillins Hives     Social History   Socioeconomic History  . Marital status: Married    Spouse name: Not on file  . Number of children: 1  . Years of education: Not on file  . Highest education level: Not on file  Occupational History  . Not on file  Social Needs  .  Financial resource strain: Not on file  . Food insecurity:    Worry: Not on file    Inability: Not on file  . Transportation needs:    Medical: Not on file    Non-medical: Not on file  Tobacco Use  . Smoking status: Never Smoker  . Smokeless tobacco: Never Used  Substance and Sexual Activity  . Alcohol use: No    Alcohol/week: 0.0 oz    Frequency: Never    Comment: wine occasionally/not while pregnant  . Drug use: No  . Sexual activity: Never    Birth control/protection: None  Lifestyle  . Physical activity:    Days per week: Not on file    Minutes per session: Not on file  . Stress: Not on file  Relationships    . Social connections:    Talks on phone: Not on file    Gets together: Not on file    Attends religious service: Not on file    Active member of club or organization: Not on file    Attends meetings of clubs or organizations: Not on file    Relationship status: Not on file  . Intimate partner violence:    Fear of current or ex partner: Not on file    Emotionally abused: Not on file    Physically abused: Not on file    Forced sexual activity: Not on file  Other Topics Concern  . Not on file  Social History Narrative  . Not on file    Family History  Problem Relation Age of Onset  . Diabetes Mother   . Asthma Mother   . Obesity Mother   . Hypertension Mother   . Hemophilia Mother   . Heart disease Father   . Heart attack Father   . Breast cancer Other   . Diabetes Maternal Grandmother        type 2  . Factor VIII deficiency Other        Also has factor 9 deficiency  . Factor VIII deficiency Other        also has factor 9 deficiency  . Hemophilia Maternal Grandfather     Review of Systems  Constitutional: Negative.   HENT: Negative.   Eyes: Negative.   Respiratory: Negative.   Cardiovascular: Negative.   Gastrointestinal: Negative.   Genitourinary: Negative.   Musculoskeletal: Negative.   Skin: Negative.   Neurological: Negative.   Psychiatric/Behavioral: Negative.      Physical Exam BP 128/84   Ht 5\' 3"  (1.6 m)   Wt 232 lb (105.2 kg)   LMP  (LMP Unknown) Comment: neg preg test 03/07/18  BMI 41.10 kg/m   Physical Exam  Constitutional: She is oriented to person, place, and time. She appears well-developed and well-nourished. No distress.  Genitourinary: Vagina normal and uterus normal. Pelvic exam was performed with patient supine. There is no rash, tenderness or lesion on the right labia. There is no rash, tenderness or lesion on the left labia. Vagina exhibits no lesion. No bleeding in the vagina. No signs of injury around the vagina. Right adnexum does not  display mass, does not display tenderness and does not display fullness. Left adnexum does not display mass, does not display tenderness and does not display fullness. Cervix does not exhibit motion tenderness, lesion or polyp.   Uterus is mobile. Uterus is not enlarged, tender, exhibiting a mass or irregular (is regular).  HENT:  Head: Normocephalic and atraumatic.  Eyes: EOM are normal. No scleral icterus.  Neck: Normal range of motion. Neck supple.  Cardiovascular: Normal rate and regular rhythm. Exam reveals no gallop and no friction rub.  No murmur heard. Pulmonary/Chest: Effort normal and breath sounds normal. No respiratory distress. She has no wheezes. She has no rales.  Abdominal: Soft. Bowel sounds are normal. She exhibits no distension and no mass. There is no tenderness. There is no rebound and no guarding.  Musculoskeletal: Normal range of motion. She exhibits no edema.  Neurological: She is alert and oriented to person, place, and time. No cranial nerve deficit.  Skin: Skin is warm and dry. No erythema.  Psychiatric: She has a normal mood and affect. Her behavior is normal. Judgment normal.    Female Chaperone present during breast and/or pelvic exam.  GYNECOLOGY PROCEDURE NOTE  Patient is a 37 y.o. [id number] presenting for Nexplanon insertion as her desires means of contraception.  She provided informed consent, signed copy in the chart, time out was performed. No intercourse since delivery.  She understands that Nexplanon is a progesterone only therapy, and that patients often patients have irregular and unpredictable vaginal bleeding or amenorrhea. She understands that other side effects are possible related to systemic progesterone, including but not limited to, headaches, breast tenderness, nausea, and irritability. While effective at preventing pregnancy long acting reversible contraceptives do not prevent transmission of sexually transmitted diseases and use of barrier  methods for this purpose was discussed. The placement procedure for Nexplanon was reviewed with the patient in detail including risks of nerve injury, infection, bleeding and injury to other muscles or tendons. She understands that the Nexplanon implant is good for 3 years and needs to be removed at the end of that time.  She understands that Nexplanon is an extremely effective option for contraception, with failure rate of <1%. This information is reviewed today and all questions were answered. Informed consent was obtained, both verbally and written.   The patient is healthy and has no contraindications to Implanon use. Urine pregnancy test was performed today and was negative.  Procedure Appropriate time out taken.  Patient placed in dorsal supine with left arm above head, elbow flexed at 90 degrees, arm resting on examination table with hand behind her head.  The bicipital grove was palpated and site 8-10cm proximal to the medial epicondyle was indentified.  Per the manufacturer's recommendations, the insertion site was marked along a line 3-5 cm posterior (toward the triceps) to the bicipital groove and at 8-10 cm medial to the medial epicondyle. The insertion site was prepped with a two betadine swabs and then injected with 2 cc of 1% lidocaine without epinephrine.  Nexplanon removed form sterile blister packaging,  Device confirmed in needle, before inserting full length of needle, tenting up the skin as the needle was advance.  The drug eluting rod was then deployed by pulling back the slider per the manufactures recommendation.  The implant was palpable by the clinician as well as the patient.  The insertion site covered dressed with a 1/2" steri-strip before applying  a kerlex bandage pressure dressing..Minimal blood loss was noted during the procedure.  The patient tolerated the procedure well.   She was instructed to wear the bandage for 24 hours, call with any signs of infection.  She was given  the Implanon card and instructed to have the rod removed in 3 years.  Assessment: 37 y.o. [id number] presenting for 6 week postpartum visit  Plan: Problem List Items Addressed This Visit    None  Visit Diagnoses    Postpartum care and examination    -  Primary   Nexplanon insertion       Relevant Medications   etonogestrel (NEXPLANON) 68 MG IMPL implant       1) Contraception Education: Nexplanon placed today without issues.   2)  Pap - ASCCP guidelines and rational discussed.  Patient opts for routine screening interval (due in 2 years)  3) Patient underwent screening for postpartum depression with no concerns noted.  4) Follow up 1 year for routine annual exam  Thomasene Mohair, MD 03/28/2018 11:28 AM

## 2018-04-05 DIAGNOSIS — L723 Sebaceous cyst: Secondary | ICD-10-CM | POA: Diagnosis not present

## 2018-04-06 ENCOUNTER — Emergency Department
Admission: EM | Admit: 2018-04-06 | Discharge: 2018-04-06 | Disposition: A | Discharge status: Home / Self Care | Payer: 59 | Attending: Emergency Medicine | Admitting: Emergency Medicine

## 2018-04-06 ENCOUNTER — Encounter: Payer: Self-pay | Admitting: Internal Medicine

## 2018-04-06 DIAGNOSIS — L02811 Cutaneous abscess of head [any part, except face]: Secondary | ICD-10-CM | POA: Diagnosis present

## 2018-04-06 DIAGNOSIS — J45909 Unspecified asthma, uncomplicated: Secondary | ICD-10-CM | POA: Diagnosis not present

## 2018-04-06 DIAGNOSIS — Z79899 Other long term (current) drug therapy: Secondary | ICD-10-CM | POA: Diagnosis not present

## 2018-04-06 DIAGNOSIS — R220 Localized swelling, mass and lump, head: Secondary | ICD-10-CM | POA: Diagnosis not present

## 2018-04-06 NOTE — Discharge Instructions (Signed)
Continue the Doxycycline until finished. You can take Ibuprofen as needed for the swelling. Make a follow up appt with Dr. Logan BoresEvans as soon as possible.

## 2018-04-06 NOTE — ED Notes (Signed)
See triage note  Presents with facial swelling .she has a large abscess area to frontal scalp area  Has been seen by PCP and given doxy  States area started draining yesterday  States now swelling is moving from forehead under eyes and nose

## 2018-04-06 NOTE — ED Triage Notes (Signed)
Pt reports cyst to top of head that is swelling and draining.

## 2018-04-06 NOTE — ED Provider Notes (Signed)
[facility] Emergency Department Provider Note ____________________________________________  Time seen: 1047  I have reviewed the triage vital signs and the nursing notes.  HISTORY  Chief Complaint  Abscess   HPI Kimberly Mcfarland is a 37 y.o. female with c/o a draining cyst in her scalp. She reports this cyst has been there for years. Thursday, the cyst opened up and drained brown, cloudy fluid. She went to [facility] yesterday for the same. They did not I&D the cyst. They put her on Doxycycline 100 mg BID x 10 days. She has had 3 doses of the abx, and reports the redness has improved but the swelling in her forehead seems to be getting worse. She denies headaches, dizziness or visual changes. She denies fever or body aches but has had some chills. She denies nausea or vomiting. She has not tried anything additional OTC. She has been working with a Development worker, international aid, Dr. Earlene Plater to discuss removing this cyst, but no surgery date had been scheduled.  Past Medical History:  Diagnosis Date  . Asthma    has not used inhaler in years  . Breast mass x 1  month   about a 1 cm mass Left at 2 o'clock per pt  . Complication of anesthesia    difficulty voiding postop  . Family history of hemophilia 05/04/2014   patient has never been tested  . GERD (gastroesophageal reflux disease)   . Heart murmur    as child  . Hypothyroid    h/o outgrew  . PCOS (polycystic ovarian syndrome)     Patient Active Problem List   Diagnosis Date Noted  . Abdominal pain 03/07/2018  . Postpartum care following vaginal delivery 02/20/2018  . Preeclampsia 02/17/2018  . Antepartum mild preeclampsia 02/13/2018  . Family history of hemophilia 02/13/2018  . Obesity in pregnancy 02/13/2018  . Elevated blood pressure affecting pregnancy in third trimester, antepartum 02/05/2018  . Palpitations 01/26/2018  . Advanced maternal age in multigravida 09/12/2017  . Supervision of high-risk pregnancy of elderly  multigravida (>= 25 years old at time of delivery) 09/12/2017  . Hx of preeclampsia, prior pregnancy, currently pregnant 09/12/2017    Past Surgical History:  Procedure Laterality Date  . CHOLECYSTECTOMY N/A 03/08/2018   Procedure: LAPAROSCOPIC CHOLECYSTECTOMY;  Surgeon: Ancil Linsey, MD;  Location: [facility] ORS;  Service: General;  Laterality: N/A;  . CYST EXCISION  2010   head cysts x 14.  high levels of keratin  . LESION EXCISION N/A 02/16/2016   Procedure: EXCISION SCALP LESION,excision 2 large scalp cysrts with intermediate closure;  Surgeon: Kieth Brightly, MD;  Location: [facility] ORS;  Service: General;  Laterality: N/A;  . WISDOM TOOTH EXTRACTION      Prior to Admission medications   Medication Sig Start Date End Date Taking? Authorizing Provider  acetaminophen (TYLENOL) 500 MG tablet Take 1,000 mg by mouth every 6 (six) hours as needed for headache.    [provider]  albuterol (PROVENTIL HFA;VENTOLIN HFA) 108 (90 BASE) MCG/ACT inhaler Inhale 2 puffs into the lungs every 4 (four) hours as needed for wheezing or shortness of breath.    [provider]  calcium carbonate (TUMS - DOSED IN MG ELEMENTAL CALCIUM) 500 MG chewable tablet Chew 1 tablet by mouth as needed for indigestion or heartburn.    [provider]  etonogestrel (NEXPLANON) 68 MG IMPL implant 1 each (68 mg total) by Subdermal route once for 1 dose. 03/28/18 03/28/18  Conard Novak, MD  labetalol (  NORMODYNE) 200 MG tablet Take 1 tablet (200 mg total) by mouth 2 (two) times daily. 02/25/18   Oswaldo Conroy, CNM  Prenatal Vit-Fe Fumarate-FA (PRENATAL MULTIVITAMIN) TABS tablet Take 1 tablet by mouth daily at 12 noon.    [provider]  prochlorperazine (COMPAZINE) 10 MG tablet Take 1 tablet (10 mg total) by mouth every 6 (six) hours as needed for nausea or vomiting. 03/06/18   Willy Eddy, MD  ranitidine (ZANTAC) 150 MG tablet Take 150 mg by mouth 2 (two) times daily.     [provider]    Allergies Adhesive [tape]; Sulfa antibiotics; Levaquin [levofloxacin in d5w]; and Penicillins  Family History  Problem Relation Age of Onset  . Diabetes Mother   . Asthma Mother   . Obesity Mother   . Hypertension Mother   . Hemophilia Mother   . Heart disease Father   . Heart attack Father   . Breast cancer Other   . Diabetes Maternal Grandmother        type 2  . Factor VIII deficiency Other        Also has factor 9 deficiency  . Factor VIII deficiency Other        also has factor 9 deficiency  . Hemophilia Maternal Grandfather     Social History Social History   Tobacco Use  . Smoking status: Never Smoker  . Smokeless tobacco: Never Used  Substance Use Topics  . Alcohol use: No    Alcohol/week: 0.0 oz    Frequency: Never    Comment: wine occasionally/not while pregnant  . Drug use: No    Review of Systems  Constitutional: Positive for chills. Negative for fever. Eyes: Negative for visual changes. Skin: Positive for cyst of scalp, not currently draining. Neurological: Negative for headaches, dizziness, focal weakness or numbness. ____________________________________________  PHYSICAL EXAM:  VITAL SIGNS: ED Triage Vitals [04/06/18 0948]  Enc Vitals Group     BP (!) 130/91     Pulse Rate 62     Resp 18     Temp 97.9 F (36.6 C)     Temp Source Oral     SpO2 97 %     Weight 228 lb (103.4 kg)     Height 5\' 3"  (1.6 m)     Head Circumference      Peak Flow      Pain Score 4     Pain Loc      Pain Edu?      Excl. in GC?     Constitutional: Alert and oriented. Well appearing and in no distress. Cardiovascular: Normal rate, regular rhythm.  Respiratory: Normal respiratory effort. No wheezes/rales/rhonchi. Neurologic:  Normal speech and language. No gross focal neurologic deficits are appreciated. Skin: 3 cm x 2 cm raised, irregular mass noted on front of scalp, adjacent to the hairline. Soft, no drainage expressed. No  evidence of surrounding cellulitis. Mild swelling of the anterior forehead but improved today per patient report. Psychiatric: Mood and affect are normal. Patient exhibits appropriate insight and judgment. ____________________________________________  INITIAL IMPRESSION / ASSESSMENT AND PLAN / ED COURSE  37 yo with mass of scalp. Has had evaluation in the past, told it was a cyst. Treated by UC yesterday for abscess, currently on Doxycycline. No need to change abx at this time. Encouraged ice to the forehead, Ibuprofen as needed for swelling. She will follow up with Dr. Earlene Plater to discuss surgical excision. Return precautions discussed. ____________________________________________  FINAL CLINICAL IMPRESSION(S) / ED  DIAGNOSES  Final diagnoses:  Mass of scalp      Lorre MunroeBaity, Larie Mathes W, NP 04/06/18 1214    Darci CurrentBrown, Garden N, MD 04/06/18 1322

## 2018-04-23 ENCOUNTER — Telehealth: Payer: Self-pay | Admitting: Surgery

## 2018-04-23 NOTE — Telephone Encounter (Signed)
Left a message for the patient to call the office patient was seen in the ED.

## 2018-04-23 NOTE — Telephone Encounter (Signed)
-----   Message from Nicole Kindred sent at 04/19/2018  3:02 PM EDT ----- Regarding: ED follow up Patient seen in ED for cyst on scalp. Patient has seen Dr Earlene Plater in the office to discuss removal.   Patient referred back to Dr Earlene Plater to discuss removing the Cyst.   Put with Earlene Plater next time he is in the office. :)

## 2018-04-26 ENCOUNTER — Other Ambulatory Visit: Payer: Self-pay

## 2018-04-26 ENCOUNTER — Emergency Department: Payer: 59

## 2018-04-26 DIAGNOSIS — J45909 Unspecified asthma, uncomplicated: Secondary | ICD-10-CM | POA: Diagnosis not present

## 2018-04-26 DIAGNOSIS — R1013 Epigastric pain: Secondary | ICD-10-CM | POA: Diagnosis not present

## 2018-04-26 DIAGNOSIS — Z79899 Other long term (current) drug therapy: Secondary | ICD-10-CM | POA: Insufficient documentation

## 2018-04-26 DIAGNOSIS — K210 Gastro-esophageal reflux disease with esophagitis: Secondary | ICD-10-CM | POA: Diagnosis not present

## 2018-04-26 DIAGNOSIS — N390 Urinary tract infection, site not specified: Secondary | ICD-10-CM | POA: Diagnosis not present

## 2018-04-26 LAB — BASIC METABOLIC PANEL
Anion gap: 10 (ref 5–15)
BUN: 15 mg/dL (ref 6–20)
CALCIUM: 9.1 mg/dL (ref 8.9–10.3)
CHLORIDE: 104 mmol/L (ref 101–111)
CO2: 26 mmol/L (ref 22–32)
CREATININE: 0.73 mg/dL (ref 0.44–1.00)
GFR calc non Af Amer: >60 mL/min (ref >60)
Glucose, Bld: 106 mg/dL — ABNORMAL HIGH (ref 65–99)
Potassium: 3.2 mmol/L — ABNORMAL LOW (ref 3.5–5.1)
Sodium: 140 mmol/L (ref 135–145)

## 2018-04-26 LAB — CBC
HCT: 35.3 % (ref 35.0–47.0)
Hemoglobin: 11.5 g/dL — ABNORMAL LOW (ref 12.0–16.0)
MCH: 26.2 pg (ref 26.0–34.0)
MCHC: 32.7 g/dL (ref 32.0–36.0)
MCV: 80.3 fL (ref 80.0–100.0)
PLATELETS: 388 10*3/uL (ref 150–440)
RBC: 4.4 MIL/uL (ref 3.80–5.20)
RDW: 15.5 % — ABNORMAL HIGH (ref 11.5–14.5)
WBC: 14.8 10*3/uL — AB (ref 3.6–11.0)

## 2018-04-26 LAB — TROPONIN I

## 2018-04-26 NOTE — ED Triage Notes (Signed)
Pt states she has epigastric pain and throat pain for 3 days. Pt states she is taking gaviscon without relief. Pt denies shob, nausea. Pt states has had lightheadedness, "but not new lightheadeness".

## 2018-04-27 ENCOUNTER — Emergency Department
Admission: EM | Admit: 2018-04-27 | Discharge: 2018-04-27 | Disposition: A | Discharge status: Home / Self Care | Payer: 59 | Attending: Emergency Medicine | Admitting: Emergency Medicine

## 2018-04-27 DIAGNOSIS — Z79899 Other long term (current) drug therapy: Secondary | ICD-10-CM | POA: Diagnosis not present

## 2018-04-27 DIAGNOSIS — K2100 Gastro-esophageal reflux disease with esophagitis, without bleeding: Secondary | ICD-10-CM

## 2018-04-27 DIAGNOSIS — R1013 Epigastric pain: Secondary | ICD-10-CM

## 2018-04-27 DIAGNOSIS — J45909 Unspecified asthma, uncomplicated: Secondary | ICD-10-CM | POA: Diagnosis not present

## 2018-04-27 DIAGNOSIS — N390 Urinary tract infection, site not specified: Secondary | ICD-10-CM

## 2018-04-27 LAB — HEPATIC FUNCTION PANEL
ALBUMIN: 4.1 g/dL (ref 3.5–5.0)
ALT: 36 U/L (ref 14–54)
AST: 26 U/L (ref 15–41)
Alkaline Phosphatase: 77 U/L (ref 38–126)
BILIRUBIN TOTAL: 0.4 mg/dL (ref 0.3–1.2)
Bilirubin, Direct: <0.1 mg/dL — ABNORMAL LOW (ref 0.1–0.5)
TOTAL PROTEIN: 7.9 g/dL (ref 6.5–8.1)

## 2018-04-27 LAB — URINALYSIS, COMPLETE (UACMP) WITH MICROSCOPIC
BILIRUBIN URINE: NEGATIVE
Glucose, UA: NEGATIVE mg/dL
Ketones, ur: NEGATIVE mg/dL
NITRITE: NEGATIVE
PROTEIN: NEGATIVE mg/dL
Specific Gravity, Urine: 1.019 (ref 1.005–1.030)
pH: 5 (ref 5.0–8.0)

## 2018-04-27 LAB — POCT PREGNANCY, URINE: PREG TEST UR: NEGATIVE

## 2018-04-27 LAB — LIPASE, BLOOD: LIPASE: 20 U/L (ref 11–51)

## 2018-04-27 MED ORDER — CEPHALEXIN 500 MG PO CAPS: 500.0000 mg | ORAL_CAPSULE | Freq: Three times a day (TID) | ORAL | 0 refills | Status: DC

## 2018-04-27 MED ORDER — SUCRALFATE 1 GM/10ML PO SUSP: 1.0000 g | Freq: Four times a day (QID) | ORAL | 1 refills | Status: DC

## 2018-04-27 MED ORDER — FAMOTIDINE 20 MG PO TABS: 20.0000 mg | ORAL_TABLET | Freq: Two times a day (BID) | ORAL | 0 refills | Status: DC

## 2018-04-27 MED ORDER — NITROFURANTOIN MONOHYD MACRO 100 MG PO CAPS
100.0000 mg | ORAL_CAPSULE | Freq: Once | ORAL | Status: AC
  Administered 2018-04-27: 100 mg via ORAL
  Filled 2018-04-27: qty 1

## 2018-04-27 MED ORDER — GI COCKTAIL ~~LOC~~
30.0000 mL | Freq: Once | ORAL | Status: AC
  Administered 2018-04-27: 30 mL via ORAL
  Filled 2018-04-27: qty 30

## 2018-04-27 MED ORDER — NITROFURANTOIN MONOHYD MACRO 100 MG PO CAPS: 100.0000 mg | ORAL_CAPSULE | Freq: Two times a day (BID) | ORAL | 0 refills | Status: DC

## 2018-04-27 MED ORDER — CEPHALEXIN 500 MG PO CAPS: 500.0000 mg | ORAL_CAPSULE | Freq: Once | ORAL | Status: DC

## 2018-04-27 NOTE — Discharge Instructions (Addendum)
1.  Take antibiotic as prescribed (Macrobid 100 mg twice daily for 7 days). 2.  Continue Prilosec daily.  Add Pepcid 20 mg twice daily for better control of reflux symptoms. 3.  Take Carafate as prescribed for reflux symptoms. 4.  Return to the ER for worsening symptoms, persistent vomiting, difficulty breathing or other concerns.

## 2018-04-27 NOTE — ED Notes (Signed)

## 2018-04-27 NOTE — ED Provider Notes (Signed)
[facility] Emergency Department Provider Note   ____________________________________________   First MD Initiated Contact with Patient 04/27/18 0235     (approximate)  I have reviewed the triage vital signs and the nursing notes.   HISTORY  Chief Complaint Abdominal Pain    HPI Kimberly Mcfarland is a 37 y.o. female who presents to the ED from home with a chief complaint of epigastric burning pain for the past 3 to 4 days.  Patient has a history of GERD, taking Prilosec 40 mg daily.  For the past 3 or 4 days she has been having constant epigastric burning type pain radiating into her throat.  Has also been taking Gaviscon without relief of symptoms.  Patient is 2.5 months postpartum, not breast-feeding, status post laparoscopic cholecystectomy 6 weeks ago.  Admits she has been drinking sodas again which she thinks is exacerbating her GERD symptoms.  Denies associated fever, chills, chest pain, shortness of breath, nausea, vomiting, dysuria, diarrhea.  States she has chronic lightheaded sensations.  Denies recent travel or trauma.   Past Medical History:  Diagnosis Date  . Asthma    has not used inhaler in years  . Breast mass x 1  month   about a 1 cm mass Left at 2 o'clock per pt  . Complication of anesthesia    difficulty voiding postop  . Family history of hemophilia 05/04/2014   patient has never been tested  . GERD (gastroesophageal reflux disease)   . Heart murmur    as child  . Hypothyroid    h/o outgrew  . PCOS (polycystic ovarian syndrome)     Patient Active Problem List   Diagnosis Date Noted  . Abdominal pain 03/07/2018  . Postpartum care following vaginal delivery 02/20/2018  . Preeclampsia 02/17/2018  . Antepartum mild preeclampsia 02/13/2018  . Family history of hemophilia 02/13/2018  . Obesity in pregnancy 02/13/2018  . Elevated blood pressure affecting pregnancy in third trimester, antepartum 02/05/2018  . Palpitations 01/26/2018   . Advanced maternal age in multigravida 09/12/2017  . Supervision of high-risk pregnancy of elderly multigravida (>= 5 years old at time of delivery) 09/12/2017  . Hx of preeclampsia, prior pregnancy, currently pregnant 09/12/2017    Past Surgical History:  Procedure Laterality Date  . CHOLECYSTECTOMY N/A 03/08/2018   Procedure: LAPAROSCOPIC CHOLECYSTECTOMY;  Surgeon: Ancil Linsey, MD;  Location: [facility] ORS;  Service: General;  Laterality: N/A;  . CYST EXCISION  2010   head cysts x 14.  high levels of keratin  . LESION EXCISION N/A 02/16/2016   Procedure: EXCISION SCALP LESION,excision 2 large scalp cysrts with intermediate closure;  Surgeon: Kieth Brightly, MD;  Location: [facility] ORS;  Service: General;  Laterality: N/A;  . WISDOM TOOTH EXTRACTION      Prior to Admission medications   Medication Sig Start Date End Date Taking? Authorizing Provider  acetaminophen (TYLENOL) 500 MG tablet Take 1,000 mg by mouth every 6 (six) hours as needed for headache.    [provider]  albuterol (PROVENTIL HFA;VENTOLIN HFA) 108 (90 BASE) MCG/ACT inhaler Inhale 2 puffs into the lungs every 4 (four) hours as needed for wheezing or shortness of breath.    [provider]  calcium carbonate (TUMS - DOSED IN MG ELEMENTAL CALCIUM) 500 MG chewable tablet Chew 1 tablet by mouth as needed for indigestion or heartburn.    [provider]  etonogestrel (NEXPLANON) 68 MG IMPL implant 1 each (68 mg total) by Subdermal route once for  1 dose. 03/28/18 03/28/18  Conard Novak, MD  labetalol (NORMODYNE) 200 MG tablet Take 1 tablet (200 mg total) by mouth 2 (two) times daily. 02/25/18   Oswaldo Conroy, CNM  Prenatal Vit-Fe Fumarate-FA (PRENATAL MULTIVITAMIN) TABS tablet Take 1 tablet by mouth daily at 12 noon.    [provider]  prochlorperazine (COMPAZINE) 10 MG tablet Take 1 tablet (10 mg total) by mouth every 6 (six) hours as needed for nausea or vomiting. 03/06/18    Willy Eddy, MD  ranitidine (ZANTAC) 150 MG tablet Take 150 mg by mouth 2 (two) times daily.    [provider]    Allergies Adhesive [tape]; Sulfa antibiotics; Levaquin [levofloxacin in d5w]; and Penicillins  Family History  Problem Relation Age of Onset  . Diabetes Mother   . Asthma Mother   . Obesity Mother   . Hypertension Mother   . Hemophilia Mother   . Heart disease Father   . Heart attack Father   . Breast cancer Other   . Diabetes Maternal Grandmother        type 2  . Factor VIII deficiency Other        Also has factor 9 deficiency  . Factor VIII deficiency Other        also has factor 9 deficiency  . Hemophilia Maternal Grandfather     Social History Social History   Tobacco Use  . Smoking status: Never Smoker  . Smokeless tobacco: Never Used  Substance Use Topics  . Alcohol use: No    Alcohol/week: 0.0 oz    Frequency: Never    Comment: wine occasionally/not while pregnant  . Drug use: No    Review of Systems  Constitutional: No fever/chills. Eyes: No visual changes. ENT: No sore throat. Cardiovascular: Denies chest pain. Respiratory: Denies shortness of breath. Gastrointestinal: Positive for abdominal pain.  No nausea, no vomiting.  No diarrhea.  No constipation. Genitourinary: Negative for dysuria. Musculoskeletal: Negative for back pain. Skin: Negative for rash. Neurological: Negative for headaches, focal weakness or numbness.   ____________________________________________   PHYSICAL EXAM:  VITAL SIGNS: ED Triage Vitals [04/26/18 2259]  Enc Vitals Group     BP 134/75     Pulse Rate 70     Resp 18     Temp 98.7 F (37.1 C)     Temp Source Oral     SpO2 100 %     Weight 227 lb (103 kg)     Height  (1.6 m)     Head Circumference      Peak Flow      Pain Score 8     Pain Loc      Pain Edu?      Excl. in GC?     Constitutional: Alert and oriented. Well appearing and in no acute distress. Eyes: Conjunctivae  are normal. PERRL. EOMI. Head: Atraumatic. Nose: No congestion/rhinnorhea. Mouth/Throat: Mucous membranes are moist.  Oropharynx non-erythematous. Neck: No stridor.   Cardiovascular: Normal rate, regular rhythm. Grossly normal heart sounds.  Good peripheral circulation. Respiratory: Normal respiratory effort.  No retractions. Lungs CTAB. Gastrointestinal: Soft and nontender to light or deep palpation. No distention. No abdominal bruits. No CVA tenderness. Musculoskeletal: No lower extremity tenderness nor edema.  No joint effusions. Neurologic:  Normal speech and language. No gross focal neurologic deficits are appreciated. No gait instability. Skin:  Skin is warm, dry and intact. No rash noted. Psychiatric: Mood and affect are normal. Speech and behavior are normal.  ____________________________________________   LABS (all labs ordered are listed, but only abnormal results are displayed)  Labs Reviewed  BASIC METABOLIC PANEL - Abnormal; Notable for the following components:      Result Value   Potassium 3.2 (*)    Glucose, Bld 106 (*)    All other components within normal limits  CBC - Abnormal; Notable for the following components:   WBC 14.8 (*)    Hemoglobin 11.5 (*)    RDW 15.5 (*)    All other components within normal limits  TROPONIN I  HEPATIC FUNCTION PANEL  LIPASE, BLOOD  URINALYSIS, COMPLETE (UACMP) WITH MICROSCOPIC  POC URINE PREG, ED   ____________________________________________  EKG  ED ECG REPORT I, SUNG,JADE J, the attending physician, personally viewed and interpreted this ECG.   Date: 04/27/2018  EKG Time: 2255  Rate: 68  Rhythm: normal EKG, normal sinus rhythm  Axis: Normal  Intervals:none  ST&T Change: Nonspecific  ____________________________________________  RADIOLOGY  ED MD interpretation: No acute cardiopulmonary process  Official radiology report(s): Dg Chest 2 View  Result Date: 04/26/2018 CLINICAL DATA:  37 year old female with  epigastric pain. EXAM: CHEST - 2 VIEW COMPARISON:  Chest CT dated 03/07/2018 FINDINGS: The heart size and mediastinal contours are within normal limits. Both lungs are clear. The visualized skeletal structures are unremarkable. IMPRESSION: No active cardiopulmonary disease. Electronically Signed   By: Elgie Collard M.D.   On: 04/26/2018 23:27    ____________________________________________   PROCEDURES  Procedure(s) performed: None  Procedures  Critical Care performed: No  ____________________________________________   INITIAL IMPRESSION / ASSESSMENT AND PLAN / ED COURSE  As part of my medical decision making, I reviewed the following data within the electronic MEDICAL RECORD NUMBER Nursing notes reviewed and incorporated, Labs reviewed, EKG interpreted, Old chart reviewed and Notes from prior ED visits   37 year old female with GERD who presents with 3 to 4-day history of epigastric burning sensation. Differential diagnosis includes, but is not limited to, intrathoracic causes for epigastric abdominal pain including ACS, gastritis, duodenitis, pancreatitis, small bowel or large bowel obstruction, abdominal aortic aneurysm, hernia, and gastritis.   Initial laboratory results remarkable for mild leukocytosis.  Chest x-ray unremarkable.  Will add LFTs, lipase.  Administer GI cocktail and reassess.   Clinical Course as of Apr 27 444  Sat Apr 27, 2018  0420 Pain resolved after GI cocktail.  Urinalysis noted.  Will discharge home on Macrobid for UTI.  Will prescribe Pepcid and Carafate for GERD symptoms.  Patient will follow-up closely with her PCP.  Strict return precautions given.  Patient verbalizes understanding and agrees with plan of care.   [JS]    Clinical Course User Index [JS] Irean Hong, MD     ____________________________________________   FINAL CLINICAL IMPRESSION(S) / ED DIAGNOSES  Final diagnoses:  Epigastric pain  Gastroesophageal reflux disease with  esophagitis     ED Discharge Orders    None       Note:  This document was prepared using Dragon voice recognition software and may include unintentional dictation errors.    Irean Hong, MD 04/27/18 [phone]

## 2018-04-27 NOTE — ED Notes (Addendum)
Pt states "severe acid reflux" pt states she has intense burning to esophagus. Pt states she has hx of reflux and tried tums and prilosec without relief. Pt states she recently started to drink soda again. Pt states pain is "not in chest but from collar bone and up and in esophagus."

## 2018-04-28 LAB — URINE CULTURE

## 2018-04-30 NOTE — Telephone Encounter (Signed)
Patients coming in on 05/02/18.

## 2018-05-02 ENCOUNTER — Encounter: Payer: Self-pay | Admitting: Surgery

## 2018-05-02 ENCOUNTER — Ambulatory Visit (INDEPENDENT_AMBULATORY_CARE_PROVIDER_SITE_OTHER): Payer: 59 | Admitting: Surgery

## 2018-05-02 DIAGNOSIS — L723 Sebaceous cyst: Secondary | ICD-10-CM | POA: Diagnosis not present

## 2018-05-02 HISTORY — DX: Sebaceous cyst: L72.3

## 2018-05-02 NOTE — H&P (View-Only) (Signed)
Surgical Clinic Progress/Follow-up Note   HPI:  37 y.o. Female presents to clinic for follow-up evaluation of scalp sebaceous cysts. Patient last seen 4/1 for post-cholecystectomy follow-up, at which time she requested evaluation and excision of the large and painful sebaceous cysts of her scalp. At that time, she reported she's had some of these since she was 37 years old, has had several excised for pain and infection, had one incised and drained by Dr. Evette Cristal early in her recent pregnancy, and was scheduled for evaluation to have the most symptomatic one(s) excised by Dr. Evette Cristal until she became pregnant (recently post-partum) and any further discussion/planning was deferred.   At patient's last visit on 4/1, she was offered excision of one or more of the most symptomatic of her scalp sebaceous cysts, and she said she planned to call office to schedule her surgery as soon as she knew her work schedule. Instead, however, she presented 2 weeks later to [facility] ED for spontaneous drainage from one or more cysts and was prescribe doxycycline, which she has since completed. She currently returns to follow-up and again requests excision, now requesting excision on an upcoming Monday as permitted by her upcoming work schedule. She currently denies any fever/chills, further drainage, abdominal pain, N/V, CP, or SOB.  Review of Systems:  Constitutional: denies any other weight loss, fever, chills, or sweats  Eyes: denies any other vision changes, history of eye injury  ENT: denies sore throat, hearing problems  Respiratory: denies shortness of breath, wheezing  Cardiovascular: denies chest pain, palpitations  Gastrointestinal: denies abdominal pain, N/V, or diarrhea Musculoskeletal: denies any other joint pains or cramps  Skin: Denies any other rashes or skin discolorations except as per interval history Neurological: denies any other headache, dizziness, weakness  Psychiatric: denies any other  depression, anxiety  All other review of systems: otherwise negative   Vital Signs:  BP (!) 124/91   Pulse 66   Temp 97.8 F (36.6 C) (Oral)   Ht  (1.6 m)   Wt 224 lb (101.6 kg)   LMP 04/08/2018   BMI 39.68 kg/m    Physical Exam:  Constitutional:  -- Obese body habitus  -- Awake, alert, and oriented x3  Eyes:  -- Pupils equally round and reactive to light  -- No scleral icterus  Ear, nose, throat:  -- No jugular venous distension  -- No nasal drainage, bleeding Pulmonary:  -- No crackles -- Equal breath sounds bilaterally -- Breathing non-labored at rest Cardiovascular:  -- S1, S2 present  -- No pericardial rubs  Gastrointestinal:  -- Soft, nontender, non-distended, no guarding/rebound  -- No abdominal masses appreciated, pulsatile or otherwise  Musculoskeletal / Integumentary:  -- Wounds or skin discoloration: several large variable-sized tender masses consistent with very large sebaceous cysts, most prominently at the center of her forehead hair line and Right parietal region, currently without erythema or drainage  -- Extremities: B/L UE and LE FROM, hands and feet warm  Neurologic:  -- Motor function: intact and symmetric  -- Sensation: intact and symmetric   Laboratory studies:  CBC:  Lab Results  Component Value Date   WBC 14.8 (H) 04/26/2018   RBC 4.40 04/26/2018   BMP:  Lab Results  Component Value Date   GLUCOSE 106 (H) 04/26/2018   GLUCOSE 78 07/17/2014   CO2 26 04/26/2018   CO2 22 07/17/2014   BUN 15 04/26/2018   BUN 6 02/12/2018   BUN 9 07/17/2014   CREATININE 0.73 04/26/2018   CREATININE  0.60 07/17/2014   CALCIUM 9.1 04/26/2018   CALCIUM 8.2 (L) 07/17/2014     Imaging: No new pertinent imaging studies   Assessment:  37 y.o. yo Female with a problem list including...  Patient Active Problem List   Diagnosis Date Noted  . Multiple large sebaceous cysts of scalp 05/02/2018  . Abdominal pain 03/07/2018  . Postpartum care following  vaginal delivery 02/20/2018  . Preeclampsia 02/17/2018  . Antepartum mild preeclampsia 02/13/2018  . Family history of hemophilia 02/13/2018  . Obesity in pregnancy 02/13/2018  . Elevated blood pressure affecting pregnancy in third trimester, antepartum 02/05/2018  . Palpitations 01/26/2018  . Advanced maternal age in multigravida 09/12/2017  . Supervision of high-risk pregnancy of elderly multigravida (>= 37 years old at time of delivery) 09/12/2017  . Hx of preeclampsia, prior pregnancy, currently pregnant 09/12/2017    presents to clinic for follow-up evaluation of multiple large and painful intermittently spontaneously draining scalp sebaceous cysts, complicated by comorbidities including morbid obesity (BMI >41), asthma, PCOS, hypothyroidism, GERD, and recent childbirth.  Plan:   - minimize trauma to scalp sebaceous cysts as able, keep clean  - all risks, benefits, and alternatives to excision of scalp sebaceous cyst(s) were discussed with the patient, all of her questions were answered to her expressed satisfaction, patient expresses she wishes to proceed, and informed consent was obtained.  - will plan for excision of frontal scalp large and very symptomatic sebaceous cyst, possibly also another adjacent cyst if not too much tension to complicate wound closure on Monday, 5/20 in OR considering location of cysts  - anticipate return to clinic 2 weeks after above elective procedure  - instructed to call office if any questions or concerns  All of the above recommendations were discussed with the patient, and all of patient's questions were answered to her expressed satisfaction.  -- Scherrie Gerlach Earlene Plater, MD, RPVI [place]: [facility] - Partnering for exceptional care. Office: [phone]

## 2018-05-02 NOTE — Patient Instructions (Signed)
You have chosen to have your cyst removed at [facility] at the [facility] on May 13, 2018. Please look at your blue sheet in case you have any questions about your surgery.

## 2018-05-02 NOTE — Progress Notes (Signed)
Surgical Clinic Progress/Follow-up Note   HPI:  37 y.o. Female presents to clinic for follow-up evaluation of scalp sebaceous cysts. Patient last seen 4/1 for post-cholecystectomy follow-up, at which time she requested evaluation and excision of the large and painful sebaceous cysts of her scalp. At that time, she reported she's had some of these since she was 37 years old, has had several excised for pain and infection, had one incised and drained by Dr. Evette Cristal early in her recent pregnancy, and was scheduled for evaluation to have the most symptomatic one(s) excised by Dr. Evette Cristal until she became pregnant (recently post-partum) and any further discussion/planning was deferred.   At patient's last visit on 4/1, she was offered excision of one or more of the most symptomatic of her scalp sebaceous cysts, and she said she planned to call office to schedule her surgery as soon as she knew her work schedule. Instead, however, she presented 2 weeks later to [facility] ED for spontaneous drainage from one or more cysts and was prescribe doxycycline, which she has since completed. She currently returns to follow-up and again requests excision, now requesting excision on an upcoming Monday as permitted by her upcoming work schedule. She currently denies any fever/chills, further drainage, abdominal pain, N/V, CP, or SOB.  Review of Systems:  Constitutional: denies any other weight loss, fever, chills, or sweats  Eyes: denies any other vision changes, history of eye injury  ENT: denies sore throat, hearing problems  Respiratory: denies shortness of breath, wheezing  Cardiovascular: denies chest pain, palpitations  Gastrointestinal: denies abdominal pain, N/V, or diarrhea Musculoskeletal: denies any other joint pains or cramps  Skin: Denies any other rashes or skin discolorations except as per interval history Neurological: denies any other headache, dizziness, weakness  Psychiatric: denies any other  depression, anxiety  All other review of systems: otherwise negative   Vital Signs:  BP (!) 124/91   Pulse 66   Temp 97.8 F (36.6 C) (Oral)   Ht  (1.6 m)   Wt 224 lb (101.6 kg)   LMP 04/08/2018   BMI 39.68 kg/m    Physical Exam:  Constitutional:  -- Obese body habitus  -- Awake, alert, and oriented x3  Eyes:  -- Pupils equally round and reactive to light  -- No scleral icterus  Ear, nose, throat:  -- No jugular venous distension  -- No nasal drainage, bleeding Pulmonary:  -- No crackles -- Equal breath sounds bilaterally -- Breathing non-labored at rest Cardiovascular:  -- S1, S2 present  -- No pericardial rubs  Gastrointestinal:  -- Soft, nontender, non-distended, no guarding/rebound  -- No abdominal masses appreciated, pulsatile or otherwise  Musculoskeletal / Integumentary:  -- Wounds or skin discoloration: several large variable-sized tender masses consistent with very large sebaceous cysts, most prominently at the center of her forehead hair line and Right parietal region, currently without erythema or drainage  -- Extremities: B/L UE and LE FROM, hands and feet warm  Neurologic:  -- Motor function: intact and symmetric  -- Sensation: intact and symmetric   Laboratory studies:  CBC:  Lab Results  Component Value Date   WBC 14.8 (H) 04/26/2018   RBC 4.40 04/26/2018   BMP:  Lab Results  Component Value Date   GLUCOSE 106 (H) 04/26/2018   GLUCOSE 78 07/17/2014   CO2 26 04/26/2018   CO2 22 07/17/2014   BUN 15 04/26/2018   BUN 6 02/12/2018   BUN 9 07/17/2014   CREATININE 0.73 04/26/2018   CREATININE  0.60 07/17/2014   CALCIUM 9.1 04/26/2018   CALCIUM 8.2 (L) 07/17/2014     Imaging: No new pertinent imaging studies   Assessment:  36 y.o. yo Female with a problem list including...  Patient Active Problem List   Diagnosis Date Noted  . Multiple large sebaceous cysts of scalp 05/02/2018  . Abdominal pain 03/07/2018  . Postpartum care following  vaginal delivery 02/20/2018  . Preeclampsia 02/17/2018  . Antepartum mild preeclampsia 02/13/2018  . Family history of hemophilia 02/13/2018  . Obesity in pregnancy 02/13/2018  . Elevated blood pressure affecting pregnancy in third trimester, antepartum 02/05/2018  . Palpitations 01/26/2018  . Advanced maternal age in multigravida 09/12/2017  . Supervision of high-risk pregnancy of elderly multigravida (>= 35 years old at time of delivery) 09/12/2017  . Hx of preeclampsia, prior pregnancy, currently pregnant 09/12/2017    presents to clinic for follow-up evaluation of multiple large and painful intermittently spontaneously draining scalp sebaceous cysts, complicated by comorbidities including morbid obesity (BMI >41), asthma, PCOS, hypothyroidism, GERD, and recent childbirth.  Plan:   - minimize trauma to scalp sebaceous cysts as able, keep clean  - all risks, benefits, and alternatives to excision of scalp sebaceous cyst(s) were discussed with the patient, all of her questions were answered to her expressed satisfaction, patient expresses she wishes to proceed, and informed consent was obtained.  - will plan for excision of frontal scalp large and very symptomatic sebaceous cyst, possibly also another adjacent cyst if not too much tension to complicate wound closure on Monday, 5/20 in OR considering location of cysts  - anticipate return to clinic 2 weeks after above elective procedure  - instructed to call office if any questions or concerns  All of the above recommendations were discussed with the patient, and all of patient's questions were answered to her expressed satisfaction.  -- Brinkley Peet E. Kristena Wilhelmi, MD, RPVI [place]: [facility] General Surgery - Partnering for exceptional care. Office: [phone] 

## 2018-05-03 NOTE — Addendum Note (Signed)
Addended by: Chrisandra Netters on: 05/03/2018 08:52 AM   Modules accepted: Orders, SmartSet

## 2018-05-07 ENCOUNTER — Other Ambulatory Visit: Payer: Self-pay

## 2018-05-07 ENCOUNTER — Encounter
Admission: RE | Admit: 2018-05-07 | Discharge: 2018-05-07 | Disposition: A | Discharge status: Home / Self Care | Payer: 59 | Source: Ambulatory Visit | Attending: Surgery | Admitting: Surgery

## 2018-05-07 ENCOUNTER — Telehealth: Payer: Self-pay | Admitting: Surgery

## 2018-05-07 HISTORY — DX: Family history of other specified conditions: Z84.89

## 2018-05-07 HISTORY — DX: Anemia, unspecified: D64.9

## 2018-05-07 NOTE — Telephone Encounter (Signed)
Pt advised of pre op date/time and sx date. Sx: 05/13/18 with Dr Davis--Excision-Scalp. Pre op: 05/07/18--1-5:00pm--phone interview.   Patient made aware to call [phone], between 1-3:00pm the day before surgery, to find out what time to arrive.

## 2018-05-07 NOTE — Pre-Procedure Instructions (Signed)
Kimberly Mcfarland  ECHO COMPLETE WO IMAGING ENHANCING AGENT  Order# [id number]  Reading physician: Antonieta Iba, MD Ordering physician: Vesta Mixer, MD Study date: 03/08/18  Study Result   Result status: Final result                   *[facility]*                       [address]                        [place], [place] [address]                            [phone]  ------------------------------------------------------------------- Transthoracic Echocardiography  Patient:    Kimberly Mcfarland, Kimberly Mcfarland MR #:       [id number] Study Date: 03/08/2018 Gender:     F Age:        37 Height:     160 cm Weight:     105.1 kg BSA:        2.22 m^2 Pt. Status: Room:       248A   REFERRING    Kristeen Miss, M.D.  ORDERING     Nahser, Jr  ADMITTING    Sudini, Srikar R  ATTENDING    Corinth, Radhika  PERFORMING   [facility], [employer]  SONOGRAPHER  Humphrey Rolls, RDCS  cc:  ------------------------------------------------------------------- LV EF: 60% -   65%  ------------------------------------------------------------------- Indications:      Chest Pain 786.50.  ------------------------------------------------------------------- History:   PMH:   Murmur.  ------------------------------------------------------------------- Study Conclusions  - Left ventricle: The cavity size was normal. Systolic function was   normal. The estimated ejection fraction was in the range of 60%   to 65%. Wall motion was normal; there were no regional wall   motion abnormalities. Left ventricular diastolic function   parameters were normal. - Left atrium: The atrium was normal in size. - Right ventricle: Systolic function was normal. - Pulmonary arteries: Systolic pressure was within the normal   range.  Impressions:  - Normal study.  ------------------------------------------------------------------- Study data:   Study status:  Routine.  Procedure:  The  patient reported no pain pre or post test. Transthoracic echocardiography. Image quality was good.          Transthoracic echocardiography. M-mode, complete 2D, spectral Doppler, and color Doppler. Birthdate:  Patient birthdate: 1981/12/10.  Age:  Patient is 37 yr old.  Sex:  Gender: female.    BMI: 41.1 kg/m^2.  Blood pressure:   110/67  Patient status:  Inpatient.  Study date:  Study date: 03/08/2018. Study time: 08:34 AM.  Location:  Bedside.  -------------------------------------------------------------------  ------------------------------------------------------------------- Left ventricle:  The cavity size was normal. Systolic function was normal. The estimated ejection fraction was in the range of 60% to 65%. Wall motion was normal; there were no regional wall motion abnormalities. The transmitral flow pattern was normal. The deceleration time of the early transmitral flow velocity was normal. The pulmonary vein flow pattern was normal. The tissue Doppler parameters were normal. Left ventricular diastolic function parameters were normal.  ------------------------------------------------------------------- Aortic valve:   Trileaflet; normal thickness leaflets. Mobility was not restricted.  Doppler:  Transvalvular velocity was within the normal range. There was no stenosis. There was no regurgitation. VTI ratio of LVOT to aortic valve: 0.8. Valve area (VTI): 3.06 cm^2. Indexed valve area (VTI):  1.38 cm^2/m^2. Peak velocity ratio of LVOT to aortic valve: 0.76. Valve area (Vmax): 2.89 cm^2. Indexed valve area (Vmax): 1.3 cm^2/m^2. Mean velocity ratio of LVOT to aortic valve: 0.73. Valve area (Vmean): 2.78 cm^2. Indexed valve area (Vmean): 1.25 cm^2/m^2.    Mean gradient (S): 7 mm Hg. Peak gradient (S): 12 mm Hg.  ------------------------------------------------------------------- Aorta:  Aortic root: The aortic root was normal in  size.  ------------------------------------------------------------------- Mitral valve:   Structurally normal valve.   Mobility was not restricted.  Doppler:  Transvalvular velocity was within the normal range. There was no evidence for stenosis. There was no regurgitation.    Valve area by pressure half-time: 4.68 cm^2. Indexed valve area by pressure half-time: 2.11 cm^2/m^2. Valve area by continuity equation (using LVOT flow): 5.1 cm^2. Indexed valve area by continuity equation (using LVOT flow): 2.3 cm^2/m^2. Mean gradient (D): 1 mm Hg. Peak gradient (D): 3 mm Hg.  ------------------------------------------------------------------- Left atrium:  The atrium was normal in size.  ------------------------------------------------------------------- Right ventricle:  The cavity size was normal. Wall thickness was normal. Systolic function was normal.  ------------------------------------------------------------------- Pulmonic valve:    Doppler:  Transvalvular velocity was within the normal range. There was no evidence for stenosis.  ------------------------------------------------------------------- Tricuspid valve:   Structurally normal valve.    Doppler: Transvalvular velocity was within the normal range. There was trivial regurgitation.  ------------------------------------------------------------------- Pulmonary artery:   The main pulmonary artery was normal-sized. Systolic pressure was within the normal range.  ------------------------------------------------------------------- Right atrium:  The atrium was normal in size.  ------------------------------------------------------------------- Pericardium:  There was no pericardial effusion.  ------------------------------------------------------------------- Systemic veins: Inferior vena cava: The vessel was normal in size.  ------------------------------------------------------------------- Measurements    Left ventricle                           Value          Reference  LV ID, ED, PLAX chordal          (H)     52.8  mm       43 - 52  LV ID, ES, PLAX chordal                  34.1  mm       23 - 38  LV fx shortening, PLAX chordal           35    %        >=29  LV PW thickness, ED                      9.66  mm       ----------  IVS/LV PW ratio, ED                      0.94           <=1.3  Stroke volume, 2D                        101   ml       ----------  Stroke volume/bsa, 2D                    46    ml/m^2   ----------  LV e&', lateral                           15.4  cm/s     ----------  LV E/e&', lateral                         5.86           ----------  LV e&', medial                            8.7   cm/s     ----------  LV E/e&', medial                          10.37          ----------  LV e&', average                           12.05 cm/s     ----------  LV E/e&', average                         7.49           ----------    Ventricular septum                       Value          Reference  IVS thickness, ED                        9.06  mm       ----------    LVOT                                     Value          Reference  LVOT ID, S                               22    mm       ----------  LVOT area                                3.8   cm^2     ----------  LVOT peak velocity, S                    134   cm/s     ----------  LVOT mean velocity, S                    89.3  cm/s     ----------  LVOT VTI, S                              26.7  cm       ----------  LVOT peak gradient, S                    7     mm Hg    ----------    Aortic valve                             Value  Reference  Aortic valve peak velocity, S            176   cm/s     ----------  Aortic valve mean velocity, S            122   cm/s     ----------  Aortic valve VTI, S                      33.2  cm       ----------  Aortic mean gradient, S                  7     mm Hg    ----------  Aortic peak gradient, S                   12    mm Hg    ----------  VTI ratio, LVOT/AV                       0.8            ----------  Aortic valve area, VTI                   3.06  cm^2     ----------  Aortic valve area/bsa, VTI               1.38  cm^2/m^2 ----------  Velocity ratio, peak, LVOT/AV            0.76           ----------  Aortic valve area, peak velocity         2.89  cm^2     ----------  Aortic valve area/bsa, peak              1.3   cm^2/m^2 ----------  velocity  Velocity ratio, mean, LVOT/AV            0.73           ----------  Aortic valve area, mean velocity         2.78  cm^2     ----------  Aortic valve area/bsa, mean              1.25  cm^2/m^2 ----------  velocity    Aorta                                    Value          Reference  Aortic root ID, ED                       30    mm       ----------    Left atrium                              Value          Reference  LA ID, A-P, ES                           44    mm       ----------  LA ID/bsa, A-P  1.98  cm/m^2   <=2.2  LA volume, S                             40.9  ml       ----------  LA volume/bsa, S                         18.4  ml/m^2   ----------  LA volume, ES, 1-p A4C                   36.2  ml       ----------  LA volume/bsa, ES, 1-p A4C               16.3  ml/m^2   ----------  LA volume, ES, 1-p A2C                   41.4  ml       ----------  LA volume/bsa, ES, 1-p A2C               18.7  ml/m^2   ----------    Mitral valve                             Value          Reference  Mitral E-wave peak velocity              90.2  cm/s     ----------  Mitral A-wave peak velocity              51.4  cm/s     ----------  Mitral mean velocity, D                  56.7  cm/s     ----------  Mitral deceleration time                 162   ms       150 - 230  Mitral pressure half-time                47    ms       ----------  Mitral mean gradient, D                  1     mm Hg    ----------  Mitral peak  gradient, D                  3     mm Hg    ----------  Mitral E/A ratio, peak                   1.8            ----------  Mitral valve area, PHT, DP               4.68  cm^2     ----------  Mitral valve area/bsa, PHT, DP           2.11  cm^2/m^2 ----------  Mitral valve area, LVOT                  5.1   cm^2     ----------  continuity  Mitral valve area/bsa, LVOT              2.3   cm^2/m^2 ----------  continuity  Mitral annulus VTI, D                    19.9  cm       ----------    Right atrium                             Value          Reference  RA ID, S-I, ES, A4C              (H)     52.1  mm       34 - 49  RA area, ES, A4C                         14.7  cm^2     8.3 - 19.5  RA volume, ES, A/L                       34.1  ml       ----------  RA volume/bsa, ES, A/L                   15.4  ml/m^2   ----------    Right ventricle                          Value          Reference  RV ID, ED, PLAX                  (H)     39.6  mm       19 - 38  TAPSE                                    21.8  mm       ----------  RV s&', lateral, S                        12.4  cm/s     ----------    Pulmonic valve                           Value          Reference  Pulmonic valve peak velocity, S          134   cm/s     ----------  Pulmonic acceleration time               148   ms       ----------  Legend: (L)  and  (H)  mark values outside specified reference range.  ------------------------------------------------------------------- Prepared and Electronically Authenticated by  Dossie Arbour, MD, [facility] 2019-03-15T09:46:03  [facility] Images   Show images for ECHOCARDIOGRAM COMPLETE  Patient Information   Patient Name Kimberly Mcfarland, Kimberly Mcfarland Sex Female DOB [birth year] SSN [id number]  Reason for Exam  Priority: Routine  Not on file  Surgical History   Surgical History   No past medical history on file.    Other Surgical History   Procedure Laterality Date Comment Source  CHOLECYSTECTOMY N/A  03/08/2018 Procedure: LAPAROSCOPIC CHOLECYSTECTOMY; Surgeon: Ancil Linsey, MD; Location: [facility] ORS; Service: General; Laterality: N/A; Provider  CYST EXCISION  2010 head cysts x 14.  high levels of keratin Provider  LESION EXCISION N/A 02/16/2016 Procedure: EXCISION SCALP LESION,excision 2 large scalp cysrts with intermediate closure; Surgeon: Kieth Brightly, MD; Location: [facility] ORS; Service: General; Laterality: N/A; Provider  WISDOM TOOTH EXTRACTION    Provider    Patient Data   Height 63 in    BP 110/67 mmHg       Performing Technologist/Nurse   Performing Technologist/Nurse: Marvis Repress                    Implants    No active implants to display in this view.  Order-Level Documents - 03/07/2018:   Scan on 03/10/2018 1:39 PM by Default, Provider, MD      Encounter-Level Documents - 03/07/2018:   Document on 03/11/2018 7:48 PM by Jerlyn Ly, NT: ED PB Summary  Scan on 03/10/2018 2:11 PM by Default, Provider, MD  Scan on 03/10/2018 1:50 PM by Default, Provider, MD  Document on 03/09/2018 10:28 AM by Beverlyn Roux, RN: IP After Visit Summary  Scan on 03/09/2018 9:01 AM by Default, Provider, MD  Electronic signature on 03/07/2018 5:02 PM - Signed      Signed   Electronically signed by Antonieta Iba, MD on 03/08/18 at 0946 EDT  Printable Result Report   Result Report   External Result Report   External Result

## 2018-05-07 NOTE — Patient Instructions (Addendum)
Your procedure is scheduled on: 05-13-18 MONDAY Report to Same Day Surgery 2nd floor medical mall [facility] Entrance-take elevator on left to 2nd floor.  Check in with surgery information desk.) To find out your arrival time please call [phone] between 1PM - 3PM on 05-10-18 FRIDAY  Remember: Instructions that are not followed completely may result in serious medical risk, up to and including death, or upon the discretion of your surgeon and anesthesiologist your surgery may need to be rescheduled.    _x___ 1. Do not eat food after midnight the night before your procedure. NO GUM OR CANDY AFTER MIDNIGHT. You may drink clear liquids up to 2 hours before you are scheduled to arrive at the hospital for your procedure.  Do not drink clear liquids within 2 hours of your scheduled arrival to the hospital.  Clear liquids include  --Water or Apple juice without pulp  --Clear carbohydrate beverage such as ClearFast or Gatorade  --Black Coffee or Clear Tea (No milk, no creamers, do not add anything to the coffee or Tea     __x__ 2. No Alcohol for 24 hours before or after surgery.   __x__3. No Smoking or e-cigarettes for 24 prior to surgery.  Do not use any chewable tobacco products for at least 6 hour prior to surgery   ____  4. Bring all medications with you on the day of surgery if instructed.    __x__ 5. Notify your doctor if there is any change in your medical condition     (cold, fever, infections).    x___6. On the morning of surgery brush your teeth with toothpaste and water.  You may rinse your mouth with mouth wash if you wish.  Do not swallow any toothpaste or mouthwash.   Do not wear jewelry, make-up, hairpins, clips or nail polish.  Do not wear lotions, powders, or perfumes. You may wear deodorant.  Do not shave 48 hours prior to surgery. Men may shave face and neck.  Do not bring valuables to the hospital.    [facility] is not responsible for any belongings or  valuables.               Contacts, dentures or bridgework may not be worn into surgery.  Leave your suitcase in the car. After surgery it may be brought to your room.  For patients admitted to the hospital, discharge time is determined by your treatment team.  _  Patients discharged the day of surgery will not be allowed to drive home.  You will need someone to drive you home and stay with you the night of your procedure.    Please read over the following fact sheets that you were given:   [facility] Preparing for Surgery and or MRSA Information   _x___ TAKE THE FOLLOWING MEDICATION THE MORNING OF SURGERY. These include:  1. PRILOSEC  2. TAKE PRILOSEC THE NIGHT BEFORE YOUR SURGERY  3.  4.  5.  6.  ____Fleets enema or Magnesium Citrate as directed.   ____ Use CHG Soap or sage wipes as directed on instruction sheet   _X___ Use inhalers on the day of surgery and bring to hospital day of surgery-USE YOUR ALBUTEROL INHALER THE MORNING OF SURGERY AND BRING INHALER TO HOSPITAL  ____ Stop Metformin and Janumet 2 days prior to surgery.    ____ Take 1/2 of usual insulin dose the night before surgery and none on the morning surgery.   ____ Follow recommendations from Cardiologist, Pulmonologist  or PCP regarding stopping Aspirin, Coumadin, Plavix ,Eliquis, Effient, or Pradaxa, and Pletal.  X____Stop Anti-inflammatories such as Advil, Aleve, Ibuprofen, Motrin, Naproxen, Naprosyn, Goodies powders or aspirin products NOW-OK to take Tylenol    ____ Stop supplements until after surgery.   ____ Bring C-Pap to the hospital.

## 2018-05-08 ENCOUNTER — Encounter
Admission: RE | Admit: 2018-05-08 | Discharge: 2018-05-08 | Disposition: A | Discharge status: Home / Self Care | Payer: 59 | Source: Ambulatory Visit | Attending: Surgery | Admitting: Surgery

## 2018-05-08 DIAGNOSIS — Z01812 Encounter for preprocedural laboratory examination: Secondary | ICD-10-CM | POA: Diagnosis not present

## 2018-05-08 LAB — POTASSIUM: Potassium: 3.7 mmol/L (ref 3.5–5.1)

## 2018-05-12 MED ORDER — VANCOMYCIN HCL 10 G IV SOLR
1500.0000 mg | INTRAVENOUS | Status: AC
  Administered 2018-05-13: 1500 mg via INTRAVENOUS
  Filled 2018-05-12: qty 1500

## 2018-05-13 ENCOUNTER — Ambulatory Visit
Admission: RE | Admit: 2018-05-13 | Discharge: 2018-05-13 | Disposition: A | Discharge status: Home / Self Care | Payer: 59 | Source: Ambulatory Visit | Attending: Surgery | Admitting: Surgery

## 2018-05-13 ENCOUNTER — Encounter: Payer: Self-pay | Admitting: *Deleted

## 2018-05-13 ENCOUNTER — Other Ambulatory Visit: Payer: Self-pay

## 2018-05-13 ENCOUNTER — Ambulatory Visit: Payer: 59 | Admitting: Anesthesiology

## 2018-05-13 ENCOUNTER — Encounter
Admission: RE | Disposition: A | Discharge status: Home / Self Care | Payer: Self-pay | Source: Ambulatory Visit | Attending: Surgery

## 2018-05-13 DIAGNOSIS — I10 Essential (primary) hypertension: Secondary | ICD-10-CM | POA: Diagnosis not present

## 2018-05-13 DIAGNOSIS — K219 Gastro-esophageal reflux disease without esophagitis: Secondary | ICD-10-CM | POA: Diagnosis not present

## 2018-05-13 DIAGNOSIS — E039 Hypothyroidism, unspecified: Secondary | ICD-10-CM | POA: Diagnosis not present

## 2018-05-13 DIAGNOSIS — Z6839 Body mass index (BMI) 39.0-39.9, adult: Secondary | ICD-10-CM | POA: Insufficient documentation

## 2018-05-13 DIAGNOSIS — L723 Sebaceous cyst: Secondary | ICD-10-CM | POA: Diagnosis not present

## 2018-05-13 DIAGNOSIS — L7211 Pilar cyst: Secondary | ICD-10-CM | POA: Diagnosis not present

## 2018-05-13 DIAGNOSIS — J45909 Unspecified asthma, uncomplicated: Secondary | ICD-10-CM | POA: Diagnosis not present

## 2018-05-13 HISTORY — PX: LESION EXCISION: SHX5167

## 2018-05-13 LAB — POCT PREGNANCY, URINE: PREG TEST UR: NEGATIVE

## 2018-05-13 SURGERY — EXCISION, LESION, SCALP
Anesthesia: General | Site: Scalp | Wound class: Clean

## 2018-05-13 MED ORDER — PROPOFOL 10 MG/ML IV BOLUS
INTRAVENOUS | Status: DC | PRN
  Administered 2018-05-13: 200 mg via INTRAVENOUS

## 2018-05-13 MED ORDER — FENTANYL CITRATE (PF) 100 MCG/2ML IJ SOLN
INTRAMUSCULAR | Status: AC
  Filled 2018-05-13: qty 2

## 2018-05-13 MED ORDER — ACETAMINOPHEN 500 MG PO TABS
1000.0000 mg | ORAL_TABLET | ORAL | Status: AC
  Administered 2018-05-13: 1000 mg via ORAL

## 2018-05-13 MED ORDER — GLYCOPYRROLATE 0.2 MG/ML IJ SOLN
INTRAMUSCULAR | Status: DC | PRN
  Administered 2018-05-13: 0.2 mg via INTRAVENOUS

## 2018-05-13 MED ORDER — MIDAZOLAM HCL 2 MG/2ML IJ SOLN
INTRAMUSCULAR | Status: AC
  Filled 2018-05-13: qty 2

## 2018-05-13 MED ORDER — BUPIVACAINE-EPINEPHRINE 0.25% -1:200000 IJ SOLN
INTRAMUSCULAR | Status: DC | PRN
  Administered 2018-05-13: 2 mL

## 2018-05-13 MED ORDER — ONDANSETRON HCL 4 MG/2ML IJ SOLN: 4.0000 mg | Freq: Once | INTRAMUSCULAR | Status: DC | PRN

## 2018-05-13 MED ORDER — MIDAZOLAM HCL 2 MG/2ML IJ SOLN
INTRAMUSCULAR | Status: DC | PRN
  Administered 2018-05-13: 2 mg via INTRAVENOUS

## 2018-05-13 MED ORDER — FENTANYL CITRATE (PF) 100 MCG/2ML IJ SOLN
INTRAMUSCULAR | Status: AC
  Administered 2018-05-13: 25 ug via INTRAVENOUS
  Filled 2018-05-13: qty 2

## 2018-05-13 MED ORDER — PROPOFOL 10 MG/ML IV BOLUS
INTRAVENOUS | Status: AC
  Filled 2018-05-13: qty 40

## 2018-05-13 MED ORDER — DEXAMETHASONE SODIUM PHOSPHATE 10 MG/ML IJ SOLN
INTRAMUSCULAR | Status: DC | PRN
  Administered 2018-05-13: 10 mg via INTRAVENOUS

## 2018-05-13 MED ORDER — GABAPENTIN 300 MG PO CAPS
ORAL_CAPSULE | ORAL | Status: AC
  Administered 2018-05-13: 300 mg via ORAL
  Filled 2018-05-13: qty 1

## 2018-05-13 MED ORDER — ACETAMINOPHEN 500 MG PO TABS
ORAL_TABLET | ORAL | Status: AC
  Administered 2018-05-13: 1000 mg via ORAL
  Filled 2018-05-13: qty 2

## 2018-05-13 MED ORDER — LIDOCAINE HCL (CARDIAC) PF 100 MG/5ML IV SOSY
PREFILLED_SYRINGE | INTRAVENOUS | Status: DC | PRN
  Administered 2018-05-13: 100 mg via INTRAVENOUS

## 2018-05-13 MED ORDER — CHLORHEXIDINE GLUCONATE CLOTH 2 % EX PADS: 6.0000 | MEDICATED_PAD | Freq: Once | CUTANEOUS | Status: DC

## 2018-05-13 MED ORDER — OXYCODONE-ACETAMINOPHEN 5-325 MG PO TABS: 1.0000 | ORAL_TABLET | ORAL | 0 refills | Status: DC | PRN

## 2018-05-13 MED ORDER — GABAPENTIN 300 MG PO CAPS
300.0000 mg | ORAL_CAPSULE | ORAL | Status: AC
  Administered 2018-05-13: 300 mg via ORAL

## 2018-05-13 MED ORDER — OXYCODONE-ACETAMINOPHEN 5-325 MG PO TABS
ORAL_TABLET | ORAL | Status: AC
  Filled 2018-05-13: qty 1

## 2018-05-13 MED ORDER — FENTANYL CITRATE (PF) 100 MCG/2ML IJ SOLN
INTRAMUSCULAR | Status: DC | PRN
  Administered 2018-05-13 (×5): 50 ug via INTRAVENOUS

## 2018-05-13 MED ORDER — OXYCODONE-ACETAMINOPHEN 5-325 MG PO TABS
1.0000 | ORAL_TABLET | Freq: Four times a day (QID) | ORAL | Status: AC | PRN
  Administered 2018-05-13: 1 via ORAL

## 2018-05-13 MED ORDER — BUPIVACAINE-EPINEPHRINE (PF) 0.25% -1:200000 IJ SOLN
INTRAMUSCULAR | Status: AC
  Filled 2018-05-13: qty 30

## 2018-05-13 MED ORDER — BACITRACIN ZINC 500 UNIT/GM EX OINT
TOPICAL_OINTMENT | CUTANEOUS | Status: AC
  Filled 2018-05-13: qty 28.35

## 2018-05-13 MED ORDER — ONDANSETRON HCL 4 MG/2ML IJ SOLN
INTRAMUSCULAR | Status: DC | PRN
  Administered 2018-05-13: 4 mg via INTRAVENOUS

## 2018-05-13 MED ORDER — LACTATED RINGERS IV SOLN: INTRAVENOUS | Status: DC

## 2018-05-13 MED ORDER — FENTANYL CITRATE (PF) 100 MCG/2ML IJ SOLN
25.0000 ug | INTRAMUSCULAR | Status: DC | PRN
  Administered 2018-05-13 (×2): 25 ug via INTRAVENOUS

## 2018-05-13 SURGICAL SUPPLY — 32 items
ADHESIVE MASTISOL STRL (MISCELLANEOUS) IMPLANT
APPLIER CLIP 9.375 SM OPEN (CLIP)
BLADE SURG 15 STRL LF DISP TIS (BLADE) ×2 IMPLANT
BLADE SURG 15 STRL SS (BLADE) ×2
CANISTER SUCT 1200ML W/VALVE (MISCELLANEOUS) ×2 IMPLANT
CHLORAPREP W/TINT 26ML (MISCELLANEOUS) ×2 IMPLANT
CLIP APPLIE 9.375 SM OPEN (CLIP) IMPLANT
DERMABOND ADVANCED (GAUZE/BANDAGES/DRESSINGS)
DERMABOND ADVANCED .7 DNX12 (GAUZE/BANDAGES/DRESSINGS) IMPLANT
DRAPE LAPAROTOMY 77X122 PED (DRAPES) ×2 IMPLANT
ELECT REM PT RETURN 9FT ADLT (ELECTROSURGICAL) ×2
ELECTRODE REM PT RTRN 9FT ADLT (ELECTROSURGICAL) ×1 IMPLANT
GAUZE SPONGE 4X4 12PLY STRL (GAUZE/BANDAGES/DRESSINGS) ×2 IMPLANT
GLOVE BIO SURGEON STRL SZ7 (GLOVE) ×2 IMPLANT
GLOVE BIO SURGEON STRL SZ8 (GLOVE) ×4 IMPLANT
GLOVE INDICATOR 7.5 STRL GRN (GLOVE) ×2 IMPLANT
GOWN STRL REUS W/ TWL LRG LVL3 (GOWN DISPOSABLE) ×2 IMPLANT
GOWN STRL REUS W/TWL LRG LVL3 (GOWN DISPOSABLE) ×2
KIT TURNOVER KIT A (KITS) ×2 IMPLANT
LABEL OR SOLS (LABEL) IMPLANT
NEEDLE HYPO 25X1 1.5 SAFETY (NEEDLE) ×2 IMPLANT
NS IRRIG 500ML POUR BTL (IV SOLUTION) ×2 IMPLANT
PACK BASIN MINOR ARMC (MISCELLANEOUS) ×2 IMPLANT
SPONGE LAP 18X18 5 PK (GAUZE/BANDAGES/DRESSINGS) ×2 IMPLANT
STAPLER SKIN PROX 35W (STAPLE) ×2 IMPLANT
STRIP CLOSURE SKIN 1/2X4 (GAUZE/BANDAGES/DRESSINGS) ×2 IMPLANT
SUT MNCRL 4-0 (SUTURE) ×1
SUT MNCRL 4-0 27XMFL (SUTURE) ×1
SUT VIC AB 3-0 SH 27 (SUTURE) ×2
SUT VIC AB 3-0 SH 27X BRD (SUTURE) ×2 IMPLANT
SUTURE MNCRL 4-0 27XMF (SUTURE) ×1 IMPLANT
SYR 10ML LL (SYRINGE) ×2 IMPLANT

## 2018-05-13 NOTE — Interval H&P Note (Signed)
History and Physical Interval Note:  05/13/2018 12:34 PM  Kimberly Mcfarland  has presented today for surgery, with the diagnosis of scalp sebaceous cyst  The various methods of treatment have been discussed with the patient and family. After consideration of risks, benefits and other options for treatment, the patient has consented to  Procedure(s): EXCISION SCALP LESION (N/A) as a surgical intervention .  The patient's history has been reviewed, patient examined, no change in status, stable for surgery.  I have reviewed the patient's chart and labs.  Questions were answered to the patient's satisfaction.     Ancil Linsey

## 2018-05-13 NOTE — Anesthesia Preprocedure Evaluation (Addendum)
Anesthesia Evaluation  Patient identified by MRN, date of birth, ID band Patient awake    Reviewed: Allergy & Precautions, NPO status , Patient's Chart, lab work & pertinent test results, reviewed documented beta blocker date and time   History of Anesthesia Complications (+) Family history of anesthesia reaction and history of anesthetic complications  Airway Mallampati: III  TM Distance: >3 FB     Dental  (+) Chipped   Pulmonary asthma ,           Cardiovascular hypertension,      Neuro/Psych    GI/Hepatic GERD  Controlled,  Endo/Other  Hypothyroidism Morbid obesity  Renal/GU      Musculoskeletal   Abdominal   Peds  Hematology  (+) anemia ,   Anesthesia Other Findings PVCs. PCOS.  Reproductive/Obstetrics                             Anesthesia Physical Anesthesia Plan  ASA: III  Anesthesia Plan: General   Post-op Pain Management:    Induction: Intravenous  PONV Risk Score and Plan:   Airway Management Planned: LMA  Additional Equipment:   Intra-op Plan:   Post-operative Plan:   Informed Consent: I have reviewed the patients History and Physical, chart, labs and discussed the procedure including the risks, benefits and alternatives for the proposed anesthesia with the patient or authorized representative who has indicated his/her understanding and acceptance.     Plan Discussed with: CRNA  Anesthesia Plan Comments:        Anesthesia Quick Evaluation

## 2018-05-13 NOTE — Anesthesia Postprocedure Evaluation (Signed)
Anesthesia Post Note  Patient: Micheline Maze  Procedure(s) Performed: EXCISION SCALP LESION (N/A Scalp)  Patient location during evaluation: PACU Anesthesia Type: General Level of consciousness: awake and alert Pain management: pain level controlled Vital Signs Assessment: post-procedure vital signs reviewed and stable Respiratory status: spontaneous breathing, nonlabored ventilation, respiratory function stable and patient connected to nasal cannula oxygen Cardiovascular status: blood pressure returned to baseline and stable Postop Assessment: no apparent nausea or vomiting Anesthetic complications: no     Last Vitals:  Vitals:   05/13/18 1542 05/13/18 1623  BP: 126/87 (!) 127/96  Pulse: 68 60  Resp: 16   Temp: 36.4 C   SpO2: 100% 100%    Last Pain:  Vitals:   05/13/18 1623  TempSrc:   PainSc: 3                  Jeffren Dombek S

## 2018-05-13 NOTE — Anesthesia Procedure Notes (Signed)
Procedure Name: LMA Insertion Date/Time: 05/13/2018 1:09 PM Performed by: Pearla Dubonnet, CRNA Pre-anesthesia Checklist: Patient identified, Emergency Drugs available, Suction available, Patient being monitored and Timeout performed Patient Re-evaluated:Patient Re-evaluated prior to induction Oxygen Delivery Method: Circle system utilized Preoxygenation: Pre-oxygenation with 100% oxygen Induction Type: IV induction Ventilation: Mask ventilation without difficulty LMA: LMA inserted LMA Size: 4.5 Number of attempts: 1 Tube secured with: Tape

## 2018-05-13 NOTE — Op Note (Signed)
SURGICAL OPERATIVE REPORT  DATE OF PROCEDURE: 05/13/2018  ATTENDING Surgeon(s): Ancil Linsey, MD  ANESTHESIA: Local  PRE-OPERATIVE DIAGNOSIS: Symptomatic (painful, intermittently draining) sebaceous cyst of the frontal center scalp, just above patient's forehead, the most symptomatic of many large scalp sebaceous cysts (icd-10: L72.3)  POST-OPERATIVE DIAGNOSIS: Symptomatic (painful, intermittently draining) sebaceous cyst of the frontal center scalp, just above patient's forehead, the most symptomatic of many large scalp sebaceous cysts (icd-10: L72.3)  PROCEDURE(S):  1.) Excision of symptomatic (painful, intermittently draining) 3 cm x 2 cm sebaceous cyst of frontal center scalp, just above patient's forehead, the most symptomatic of many large scalp sebaceous cysts (cpt: 21012)  INTRAOPERATIVE FINDINGS: 3 cm x 2 cm non-infected subcutaneous mass of the frontal center scalp, just above patient's forehead (most likely a sebaceous cyst), removed intact except a small additional deep fragment of capsule without spillage of cyst contents  INTRAVENOUS FLUIDS: 0 mL crystalloid   ESTIMATED BLOOD LOSS: Minimal (< 20 mL)  URINE OUTPUT: No Foley  SPECIMENS: 3 cm x 2 cm non-infected subcutaneous mass of the frontal center scalp, just above patient's forehead (most likely a sebaceous cyst), removed intact except a small additional deep fragment of capsule without spillage of cyst contents  IMPLANTS: None  DRAINS: None  COMPLICATIONS: None apparent  CONDITION AT END OF PROCEDURE: Hemodynamically stable and awake  DISPOSITION OF PATIENT: PACU  INDICATIONS FOR PROCEDURE:  Patient is a 37 y.o. female who presented for a symptomatic (painful, intermittently draining)  sebaceous cystfrontal center scalp, just above patient's forehead. Patient reports it's been present/growing for years, periodically grows in size, drains, and then regresses, and recently has become increasingly painful  due in part to its location, especially with brushing her hair. Patient requested that it be removed so activities can be performed with less discomfort, andpatient was accordingly referred for surgical evaluation and management.All risks, benefits, and alternatives to above procedure were discussed with the patient, all of patient's questions were answered to his expressed satisfaction, and informed consent was obtained and documented.  DETAILS OF PROCEDURE: Patient was brought to the operating suite and was appropriately identified. Due to the cyst's size and location, general anesthesia was administered and LMA intubation was performed by anesthetist. Laying on the OR table in supine position, operative site was prepped and draped in the usual sterile fashion, and following a brief time out, a 3.5 cm long and 2 cm wide transverse elliptical incision was made using a #15 blade scalpel, and incision was extended deep around subcutaneous mass to fascia using sharp + blunt dissection and electrocautery. During the course of dissecting free the cyst, it was not disrupted and was removed intact. Direct pressure was applied for hemostasis, and an additional fragment of what appeared to be the cyst capsule was excised. Further examination did not reveal any additional cyst. Hemostasis was achieved, and the wound was copiously irrigated with sterile warm saline. Deep tissue and dermis were re-approximated using buried interrupted 3-0 Vicryl suture, and surgical skin staples were used on patient's scalp to re-approximate epidermis. Skin was then cleaned and dried, and sterile bacitracin was applied to the wound. Patient was then safely transferred to recovery for post-procedural monitoring and care.  I was present for all aspects of the above procedure, and no operative complications were apparent.

## 2018-05-13 NOTE — Anesthesia Post-op Follow-up Note (Signed)
Anesthesia QCDR form completed.        

## 2018-05-13 NOTE — Discharge Instructions (Signed)
In addition to included general post-operative instructions for Excision of Sebaceous/Epidermoid Cyst,  Diet: Resume home heart healthy diet.   Activity: No heavy lifting >20 pounds (children, pets, laundry, garbage) or strenuous activity until follow-up, but light activity and walking are encouraged. Do not drive or drink alcohol if taking narcotic pain medications.  Wound care: 2 days after surgery (Wednesday, 5/22), you may shower/get incision wet with soapy water and pat dry (do not rub incisions), but no baths or submerging incision underwater until follow-up.   Medications: Resume all home medications. For mild to moderate pain: acetaminophen (Tylenol) or ibuprofen/naproxen (if no kidney disease). Combining Tylenol with alcohol can substantially increase your risk of causing liver disease. Narcotic pain medications, if prescribed, can be used for severe pain, though may cause nausea, constipation, and drowsiness. Do not combine Tylenol and Percocet (or similar) within a 6 hour period as Percocet (and similar) contain(s) Tylenol. If you do not need the narcotic pain medication, you do not need to fill the prescription.  Call office [phone]) at any time if any questions, worsening pain, fevers/chills, bleeding, drainage from incision site, or other concerns.

## 2018-05-13 NOTE — Transfer of Care (Signed)
Immediate Anesthesia Transfer of Care Note  Patient: Kimberly Mcfarland  Procedure(s) Performed: EXCISION SCALP LESION (N/A Scalp)  Patient Location: PACU  Anesthesia Type:General  Level of Consciousness: awake  Airway & Oxygen Therapy: Patient Spontanous Breathing  Post-op Assessment: Report given to RN  Post vital signs: stable  Last Vitals:  Vitals Value Taken Time  BP 131/88 05/13/2018  2:52 PM  Temp    Pulse 72 05/13/2018  2:55 PM  Resp 10 05/13/2018  2:55 PM  SpO2 95 % 05/13/2018  2:55 PM  Vitals shown include unvalidated device data.  Last Pain:  Vitals:   05/13/18 1225  TempSrc: Oral  PainSc: 0-No pain         Complications: No apparent anesthesia complications

## 2018-05-14 ENCOUNTER — Encounter: Payer: Self-pay | Admitting: Surgery

## 2018-05-15 LAB — SURGICAL PATHOLOGY

## 2018-05-17 DIAGNOSIS — Z8709 Personal history of other diseases of the respiratory system: Secondary | ICD-10-CM | POA: Diagnosis not present

## 2018-05-17 DIAGNOSIS — Z7689 Persons encountering health services in other specified circumstances: Secondary | ICD-10-CM | POA: Diagnosis not present

## 2018-05-17 DIAGNOSIS — K219 Gastro-esophageal reflux disease without esophagitis: Secondary | ICD-10-CM | POA: Diagnosis not present

## 2018-05-17 DIAGNOSIS — R0683 Snoring: Secondary | ICD-10-CM | POA: Diagnosis not present

## 2018-05-29 ENCOUNTER — Encounter: Payer: 59 | Admitting: Surgery

## 2018-05-31 ENCOUNTER — Encounter: Payer: Self-pay | Admitting: Surgery

## 2018-05-31 ENCOUNTER — Ambulatory Visit (INDEPENDENT_AMBULATORY_CARE_PROVIDER_SITE_OTHER): Payer: 59 | Admitting: Surgery

## 2018-05-31 VITALS — BP 123/83 | HR 66 | Temp 97.6°F | Ht 63.0 in | Wt 221.8 lb

## 2018-05-31 DIAGNOSIS — L723 Sebaceous cyst: Secondary | ICD-10-CM

## 2018-05-31 DIAGNOSIS — Z4889 Encounter for other specified surgical aftercare: Secondary | ICD-10-CM

## 2018-05-31 NOTE — Patient Instructions (Addendum)
We removed your staples today.    Please call our office if you have questions or concerns.

## 2018-05-31 NOTE — Progress Notes (Signed)
Surgical Clinic Progress/Follow-up Note   HPI:  37 y.o. Female presents to clinic for post-op follow-up 2 weeks s/p excision of prominent and chronically symptomatic (painful and intermittently draining) forehead-frontal scalp large sebaceous cyst Kimberly Mcfarland(Telvin Reinders, 05/13/2018). Patient reports complete resolution of pre-operative pain at this site and has been tolerating regular diet with +flatus and normal BM's, denies any incisional drainage, N/V, fever/chills, CP, or SOB.  Review of Systems:  Constitutional: denies fever/chills  Respiratory: denies shortness of breath, wheezing  Cardiovascular: denies chest pain, palpitations  Gastrointestinal: abdominal pain, N/V, and bowel function as per interval history Skin: Denies any other rashes or skin discolorations except post-surgical wounds as per interval history  Vital Signs:  BP 123/83   Pulse 66   Temp 97.6 F (36.4 C) (Oral)   Ht 5\' 3"  (1.6 m)   Wt 221 lb 12.8 oz (100.6 kg)   BMI 39.29 kg/m    Physical Exam:  Constitutional:  -- Obese body habitus  -- Awake, alert, and oriented x3  Pulmonary:  -- No crackles -- Equal breath sounds bilaterally -- Breathing non-labored at rest Cardiovascular:  -- S1, S2 present  -- No pericardial rubs  Gastrointestinal:  -- Soft and non-distended, non-tender, no guarding/rebound tenderness -- No abdominal masses appreciated, pulsatile or otherwise  Musculoskeletal / Integumentary:  -- Wounds or skin discoloration: Post-surgical forehead/frontal scalp incision well-approximated without any peri-incisional erythema or drainage -- Extremities: B/L UE and LE FROM, hands and feet warm   Assessment:  37 y.o. yo Female with a problem list including...  Patient Active Problem List   Diagnosis Date Noted  . Gastroesophageal reflux disease 05/17/2018  . History of asthma 05/17/2018  . Multiple large sebaceous cysts of scalp 05/02/2018  . Abdominal pain 03/07/2018  . Postpartum care following vaginal  delivery 02/20/2018  . Preeclampsia 02/17/2018  . Antepartum mild preeclampsia 02/13/2018  . Family history of hemophilia 02/13/2018  . Obesity in pregnancy 02/13/2018  . Elevated blood pressure affecting pregnancy in third trimester, antepartum 02/05/2018  . Palpitations 01/26/2018  . Advanced maternal age in multigravida 09/12/2017  . Supervision of high-risk pregnancy of elderly multigravida (>= 37 years old at time of delivery) 09/12/2017  . Hx of preeclampsia, prior pregnancy, currently pregnant 09/12/2017    presents to clinic for post-op follow-up evaluation, doing well 2 weeks s/p excision of forehead/frontal scalp large and symptomatic (painful and intermittently draining, non-infected) sebaceous scalp Kimberly Mcfarland(Aleshka Corney, 05/13/2018).  Plan:              - advance diet as tolerated  - incisional skin staples removed uneventfully             - okay to submerge incisions under water (baths, swimming) prn             - gradually resume all activities without restrictions over next 2 weeks             - apply sunblock particularly to incisions with sun exposure to reduce pigmentation of scars             - return to clinic whenever you would like to have additional of your large scalp sebaceous cysts excised, instructed to call office if any questions or concerns  All of the above recommendations were discussed with the patient, and all of patient's questions were answered to her expressed satisfaction.  -- Scherrie GerlachJason E. Kimberly Plateravis, MD, RPVI [place]: [facility] General Surgery - Partnering for exceptional care. Office: [phone]

## 2018-06-12 ENCOUNTER — Ambulatory Visit: Payer: 59 | Attending: Internal Medicine

## 2018-06-12 DIAGNOSIS — G4730 Sleep apnea, unspecified: Secondary | ICD-10-CM | POA: Diagnosis not present

## 2018-06-12 DIAGNOSIS — G4733 Obstructive sleep apnea (adult) (pediatric): Secondary | ICD-10-CM | POA: Diagnosis not present

## 2018-07-04 ENCOUNTER — Ambulatory Visit: Payer: 59 | Attending: Internal Medicine

## 2018-07-04 DIAGNOSIS — G4733 Obstructive sleep apnea (adult) (pediatric): Secondary | ICD-10-CM | POA: Insufficient documentation

## 2018-09-25 DIAGNOSIS — G4733 Obstructive sleep apnea (adult) (pediatric): Secondary | ICD-10-CM | POA: Diagnosis not present

## 2018-09-25 DIAGNOSIS — Z6838 Body mass index (BMI) 38.0-38.9, adult: Secondary | ICD-10-CM | POA: Diagnosis not present

## 2018-09-25 DIAGNOSIS — Z8709 Personal history of other diseases of the respiratory system: Secondary | ICD-10-CM | POA: Diagnosis not present

## 2018-09-25 DIAGNOSIS — K219 Gastro-esophageal reflux disease without esophagitis: Secondary | ICD-10-CM | POA: Diagnosis not present

## 2018-10-16 DIAGNOSIS — G4733 Obstructive sleep apnea (adult) (pediatric): Secondary | ICD-10-CM | POA: Diagnosis not present

## 2018-10-29 ENCOUNTER — Encounter: Payer: Self-pay | Admitting: Emergency Medicine

## 2018-10-29 ENCOUNTER — Other Ambulatory Visit: Payer: Self-pay

## 2018-10-29 ENCOUNTER — Emergency Department
Admission: EM | Admit: 2018-10-29 | Discharge: 2018-10-29 | Disposition: A | Discharge status: Home / Self Care | Payer: 59 | Attending: Emergency Medicine | Admitting: Emergency Medicine

## 2018-10-29 DIAGNOSIS — J45909 Unspecified asthma, uncomplicated: Secondary | ICD-10-CM | POA: Diagnosis not present

## 2018-10-29 DIAGNOSIS — E039 Hypothyroidism, unspecified: Secondary | ICD-10-CM | POA: Diagnosis not present

## 2018-10-29 DIAGNOSIS — Z79899 Other long term (current) drug therapy: Secondary | ICD-10-CM | POA: Insufficient documentation

## 2018-10-29 DIAGNOSIS — R1110 Vomiting, unspecified: Secondary | ICD-10-CM | POA: Diagnosis present

## 2018-10-29 DIAGNOSIS — R197 Diarrhea, unspecified: Secondary | ICD-10-CM | POA: Insufficient documentation

## 2018-10-29 DIAGNOSIS — A09 Infectious gastroenteritis and colitis, unspecified: Secondary | ICD-10-CM | POA: Diagnosis not present

## 2018-10-29 LAB — PREGNANCY, URINE: PREG TEST UR: NEGATIVE

## 2018-10-29 LAB — URINALYSIS, COMPLETE (UACMP) WITH MICROSCOPIC
BACTERIA UA: NONE SEEN
Bilirubin Urine: NEGATIVE
Glucose, UA: NEGATIVE mg/dL
Hgb urine dipstick: NEGATIVE
Ketones, ur: NEGATIVE mg/dL
Nitrite: NEGATIVE
Protein, ur: NEGATIVE mg/dL
Specific Gravity, Urine: 1.029 (ref 1.005–1.030)
pH: 5 (ref 5.0–8.0)

## 2018-10-29 LAB — CBC
HEMATOCRIT: 43.4 % (ref 36.0–46.0)
Hemoglobin: 13.4 g/dL (ref 12.0–15.0)
MCH: 24.5 pg — AB (ref 26.0–34.0)
MCHC: 30.9 g/dL (ref 30.0–36.0)
MCV: 79.2 fL — ABNORMAL LOW (ref 80.0–100.0)
Platelets: 503 10*3/uL — ABNORMAL HIGH (ref 150–400)
RBC: 5.48 MIL/uL — ABNORMAL HIGH (ref 3.87–5.11)
RDW: 16.1 % — ABNORMAL HIGH (ref 11.5–15.5)
WBC: 20.6 10*3/uL — ABNORMAL HIGH (ref 4.0–10.5)
nRBC: 0 % (ref 0.0–0.2)

## 2018-10-29 LAB — COMPREHENSIVE METABOLIC PANEL
ALT: 23 U/L (ref 0–44)
AST: 18 U/L (ref 15–41)
Albumin: 4.2 g/dL (ref 3.5–5.0)
Alkaline Phosphatase: 77 U/L (ref 38–126)
Anion gap: 8 (ref 5–15)
BILIRUBIN TOTAL: 0.6 mg/dL (ref 0.3–1.2)
BUN: 17 mg/dL (ref 6–20)
CHLORIDE: 106 mmol/L (ref 98–111)
CO2: 25 mmol/L (ref 22–32)
Calcium: 8.7 mg/dL — ABNORMAL LOW (ref 8.9–10.3)
Creatinine, Ser: 0.61 mg/dL (ref 0.44–1.00)
GFR calc Af Amer: >60 mL/min (ref >60)
GFR calc non Af Amer: >60 mL/min (ref >60)
GLUCOSE: 127 mg/dL — AB (ref 70–99)
Potassium: 3.9 mmol/L (ref 3.5–5.1)
Sodium: 139 mmol/L (ref 135–145)
TOTAL PROTEIN: 8 g/dL (ref 6.5–8.1)

## 2018-10-29 LAB — LIPASE, BLOOD: Lipase: 21 U/L (ref 11–51)

## 2018-10-29 MED ORDER — SODIUM CHLORIDE 0.9 % IV BOLUS
1000.0000 mL | Freq: Once | INTRAVENOUS | Status: AC
  Administered 2018-10-29: 1000 mL via INTRAVENOUS

## 2018-10-29 MED ORDER — ONDANSETRON 4 MG PO TBDP: ORAL_TABLET | ORAL | 0 refills | Status: DC

## 2018-10-29 NOTE — ED Triage Notes (Addendum)
Patient ambulatory to triage with steady gait, without difficulty or distress noted; pt reports N/V/D since 9pm; denies abd pain

## 2018-10-29 NOTE — Discharge Instructions (Addendum)

## 2018-10-29 NOTE — ED Provider Notes (Signed)
[facility] Emergency Department Provider Note  ____________________________________________   First MD Initiated Contact with Patient 10/29/18 0518     (approximate)  I have reviewed the triage vital signs and the nursing notes.   HISTORY  Chief Complaint Emesis and Diarrhea    HPI Kimberly Mcfarland is a 37 y.o. female with no contributory past medical history who presents for evaluation of acute onset severe diarrhea with one episode of vomiting.  She reports that her son had similar issues about a week ago and they resolved on their own.  She has not been on any antibiotics recently.  She does work in the hospital.  She said that she had a normal day and was in her normal state of health with no fever/chills, chest pain, shortness of breath, abdominal pain, nor dysuria.  Past Medical History:  Diagnosis Date  . Anemia    after pregnancy  . Asthma    well controlled-has not used inhaler in years  . Breast mass x 1  month   about a 1 cm mass Left at 2 o'clock per pt  . Complication of anesthesia    difficulty voiding postop  . Family history of adverse reaction to anesthesia    mom woke up during surgery  . Family history of hemophilia 05/04/2014   patient has never been tested  . GERD (gastroesophageal reflux disease)   . Heart murmur    as child  . Hypothyroid    h/o outgrew  . Multiple large sebaceous cysts of scalp 05/02/2018  . PCOS (polycystic ovarian syndrome)     Patient Active Problem List   Diagnosis Date Noted  . Gastroesophageal reflux disease 05/17/2018  . History of asthma 05/17/2018  . Multiple large sebaceous cysts of scalp 05/02/2018  . Abdominal pain 03/07/2018  . Postpartum care following vaginal delivery 02/20/2018  . Preeclampsia 02/17/2018  . Antepartum mild preeclampsia 02/13/2018  . Family history of hemophilia 02/13/2018  . Obesity in pregnancy 02/13/2018  . Elevated blood pressure affecting pregnancy in third  trimester, antepartum 02/05/2018  . Palpitations 01/26/2018  . Advanced maternal age in multigravida 09/12/2017  . Supervision of high-risk pregnancy of elderly multigravida (>= 66 years old at time of delivery) 09/12/2017  . Hx of preeclampsia, prior pregnancy, currently pregnant 09/12/2017    Past Surgical History:  Procedure Laterality Date  . CHOLECYSTECTOMY N/A 03/08/2018   Procedure: LAPAROSCOPIC CHOLECYSTECTOMY;  Surgeon: Ancil Linsey, MD;  Location: [facility] ORS;  Service: General;  Laterality: N/A;  . CYST EXCISION  2010   head cysts x 14.  high levels of keratin  . LESION EXCISION N/A 02/16/2016   Procedure: EXCISION SCALP LESION,excision 2 large scalp cysrts with intermediate closure;  Surgeon: Kieth Brightly, MD;  Location: [facility] ORS;  Service: General;  Laterality: N/A;  . LESION EXCISION N/A 05/13/2018   Procedure: EXCISION SCALP LESION;  Surgeon: Ancil Linsey, MD;  Location: [facility] ORS;  Service: General;  Laterality: N/A;  . WISDOM TOOTH EXTRACTION      Prior to Admission medications   Medication Sig Start Date End Date Taking? Authorizing Provider  acetaminophen (TYLENOL) 500 MG tablet Take 1,000 mg by mouth every 6 (six) hours as needed for headache.    [provider]  albuterol (PROVENTIL HFA;VENTOLIN HFA) 108 (90 BASE) MCG/ACT inhaler Inhale 2 puffs into the lungs every 4 (four) hours as needed for wheezing or shortness of breath.    [provider]  etonogestrel (NEXPLANON) 68  MG IMPL implant 1 each (68 mg total) by Subdermal route once for 1 dose. 03/28/18 05/07/18  Conard Novak, MD  omeprazole (PRILOSEC) 40 MG capsule Take 40 mg by mouth daily.    [provider]  ondansetron (ZOFRAN ODT) 4 MG disintegrating tablet Allow 1-2 tablets to dissolve in your mouth every 8 hours as needed for nausea/vomiting 10/29/18   Loleta Rose, MD  oxyCODONE-acetaminophen (PERCOCET/ROXICET) 5-325 MG tablet Take 1-2 tablets by mouth every 4 (four)  hours as needed for severe pain. 05/13/18   Ancil Linsey, MD  pantoprazole (PROTONIX) 40 MG tablet Take by mouth. 05/17/18 05/17/19  [provider]  vitamin B-12 (CYANOCOBALAMIN) 1000 MCG tablet Take by mouth. 05/21/18 05/21/19  [provider]    Allergies Sulfa antibiotics; Levaquin [levofloxacin in d5w]; Other; Penicillins; and Pepcid [famotidine]  Family History  Problem Relation Age of Onset  . Diabetes Mother   . Asthma Mother   . Obesity Mother   . Hypertension Mother   . Hemophilia Mother   . Heart disease Father   . Heart attack Father   . Breast cancer Other   . Diabetes Maternal Grandmother        type 2  . Factor VIII deficiency Other        Also has factor 9 deficiency  . Factor VIII deficiency Other        also has factor 9 deficiency  . Hemophilia Maternal Grandfather     Social History Social History   Tobacco Use  . Smoking status: Never Smoker  . Smokeless tobacco: Never Used  Substance Use Topics  . Alcohol use: No    Alcohol/week: 0.0 standard drinks    Frequency: Never  . Drug use: No    Review of Systems Constitutional: No fever/chills Eyes: No visual changes. ENT: No sore throat. Cardiovascular: Denies chest pain. Respiratory: Denies shortness of breath. Gastrointestinal: No abdominal pain.  No nausea, no vomiting.  No diarrhea.  No constipation. Genitourinary: Negative for dysuria. Musculoskeletal: Negative for neck pain.  Negative for back pain. Integumentary: Negative for rash. Neurological: Negative for headaches, focal weakness or numbness.   ____________________________________________   PHYSICAL EXAM:  VITAL SIGNS: ED Triage Vitals  Enc Vitals Group     BP 10/29/18 0310 131/87     Pulse Rate 10/29/18 0310 94     Resp 10/29/18 0310 18     Temp 10/29/18 0310 98.1 F (36.7 C)     Temp Source 10/29/18 0310 Oral     SpO2 10/29/18 0310 98 %     Weight 10/29/18 0309 98.4 kg (217 lb)     Height 10/29/18 0309  1.6 m (5\' 3" )     Head Circumference --      Peak Flow --      Pain Score 10/29/18 0308 0     Pain Loc --      Pain Edu? --      Excl. in GC? --     Constitutional: Alert and oriented. Well appearing and in no acute distress. Eyes: Conjunctivae are normal.  Head: Atraumatic. Nose: No congestion/rhinnorhea. Mouth/Throat: Mucous membranes are moist. Neck: No stridor.  No meningeal signs.   Cardiovascular: Normal rate, regular rhythm. Good peripheral circulation. Grossly normal heart sounds. Respiratory: Normal respiratory effort.  No retractions. Lungs CTAB. Gastrointestinal: Soft and nontender. No distention.  Musculoskeletal: No lower extremity tenderness nor edema. No gross deformities of extremities. Neurologic:  Normal speech and language. No gross focal neurologic deficits  are appreciated.  Skin:  Skin is warm, dry and intact. No rash noted. Psychiatric: Mood and affect are normal. Speech and behavior are normal.  ____________________________________________   LABS (all labs ordered are listed, but only abnormal results are displayed)  Labs Reviewed  COMPREHENSIVE METABOLIC PANEL - Abnormal; Notable for the following components:      Result Value   Glucose, Bld 127 (*)    Calcium 8.7 (*)    All other components within normal limits  CBC - Abnormal; Notable for the following components:   WBC 20.6 (*)    RBC 5.48 (*)    MCV 79.2 (*)    MCH 24.5 (*)    RDW 16.1 (*)    Platelets 503 (*)    All other components within normal limits  URINALYSIS, COMPLETE (UACMP) WITH MICROSCOPIC - Abnormal; Notable for the following components:   Color, Urine YELLOW (*)    APPearance CLOUDY (*)    Leukocytes, UA LARGE (*)    All other components within normal limits  C DIFFICILE QUICK SCREEN W PCR REFLEX  GASTROINTESTINAL PANEL BY PCR, STOOL (REPLACES STOOL CULTURE)  URINE CULTURE  LIPASE, BLOOD  PREGNANCY, URINE   ____________________________________________  EKG  None - EKG  not ordered by ED physician ____________________________________________  RADIOLOGY   ED MD interpretation: No indication for imaging  Official radiology report(s): No results found.  ____________________________________________   PROCEDURES  Critical Care performed: No   Procedure(s) performed:   Procedures   ____________________________________________   INITIAL IMPRESSION / ASSESSMENT AND PLAN / ED COURSE  As part of my medical decision making, I reviewed the following data within the electronic MEDICAL RECORD NUMBER Nursing notes reviewed and incorporated, Labs reviewed  and Old chart reviewed    Differential diagnosis includes, but is not limited to, viral gastroenteritis, bacterial enteritis, UTI, less likely SBO.  The patient has had only one episode of vomiting.  She has been having copious numerous episodes of diarrhea but her son just recently had it as well.  She looks well and is in no distress.  She has absolutely no abdominal pain or tenderness to palpation.  Signs and symptoms are definitely most consistent with diarrhea of presumed infectious origin.  I offered to check stool studies but the patient does not really feel they are necessary and nor do I.  She was concerned she may be getting dehydrated and that was the main reason she came in to the hospital.  I am giving her 1 L of fluids and morphine 4 mg IV.  She is comfortable going home afterwards and following up as an outpatient.  She is tolerating oral intake without difficulty.  Lab work is notable for a leukocytosis of about 20 in the setting of presumed infectious gastroenteritis.  Her conference of metabolic panel is within normal limits and her lipase is negative.  Urine pregnancy is negative.  Urinalysis is equivocal; she has large leukocytes and 11-20 white blood cells but also has extensive contamination with squamous epithelials and is nitrite negative.  I discussed with her whether or not she would want  to be treated empirically or wait for the urine culture, because I also explained that I was somewhat concerned about treating her empirically in the setting of all the diarrhea she is having which may further disrupt her bowel flora.  She is comfortable waiting to see the results of the urine culture.  I have sent a message through [facility] to Viviano Simas to help facilitate  following up on the urine culture to determine if she needs a prescription for Keflex.  I gave the patient my usual customary return precautions and she is comfortable with the plan.     ____________________________________________  FINAL CLINICAL IMPRESSION(S) / ED DIAGNOSES  Final diagnoses:  Diarrhea of presumed infectious origin     MEDICATIONS GIVEN DURING THIS VISIT:  Medications  sodium chloride 0.9 % bolus 1,000 mL (0 mLs Intravenous Stopped 10/29/18 0750)     ED Discharge Orders         Ordered    ondansetron (ZOFRAN ODT) 4 MG disintegrating tablet     10/29/18 0736           Note:  This document was prepared using Dragon voice recognition software and may include unintentional dictation errors.    Loleta Rose, MD 10/29/18 [phone]

## 2018-11-01 LAB — URINE CULTURE: Special Requests: NORMAL

## 2018-11-02 NOTE — Progress Notes (Signed)
Reviewed Ucx results. Spoke with Dr. Lenard Lance about culture. Patient presented with diarrhea and one episode of vommting (presumed from gastritis). Did not have any signs of UTI. UA was very contaminated with epitheial cells. After discussion, it was decided that this did not need to be treated with abx  Olene Floss, Pharm.D, BCPS Clinical Pharmacist

## 2018-11-12 ENCOUNTER — Other Ambulatory Visit: Payer: Self-pay | Admitting: Obstetrics and Gynecology

## 2018-11-12 DIAGNOSIS — N6310 Unspecified lump in the right breast, unspecified quadrant: Secondary | ICD-10-CM

## 2018-11-12 DIAGNOSIS — R928 Other abnormal and inconclusive findings on diagnostic imaging of breast: Secondary | ICD-10-CM

## 2018-11-12 DIAGNOSIS — N6320 Unspecified lump in the left breast, unspecified quadrant: Secondary | ICD-10-CM

## 2018-11-14 DIAGNOSIS — R197 Diarrhea, unspecified: Secondary | ICD-10-CM | POA: Diagnosis not present

## 2018-11-14 DIAGNOSIS — Z09 Encounter for follow-up examination after completed treatment for conditions other than malignant neoplasm: Secondary | ICD-10-CM | POA: Diagnosis not present

## 2018-11-16 DIAGNOSIS — G4733 Obstructive sleep apnea (adult) (pediatric): Secondary | ICD-10-CM | POA: Diagnosis not present

## 2018-12-05 ENCOUNTER — Encounter: Payer: Self-pay | Admitting: Obstetrics and Gynecology

## 2018-12-05 ENCOUNTER — Ambulatory Visit
Admission: RE | Admit: 2018-12-05 | Discharge: 2018-12-05 | Disposition: A | Discharge status: Home / Self Care | Payer: 59 | Source: Ambulatory Visit | Attending: Obstetrics and Gynecology | Admitting: Obstetrics and Gynecology

## 2018-12-05 DIAGNOSIS — R928 Other abnormal and inconclusive findings on diagnostic imaging of breast: Secondary | ICD-10-CM

## 2018-12-05 DIAGNOSIS — N6320 Unspecified lump in the left breast, unspecified quadrant: Secondary | ICD-10-CM

## 2018-12-05 DIAGNOSIS — N6310 Unspecified lump in the right breast, unspecified quadrant: Secondary | ICD-10-CM

## 2018-12-16 DIAGNOSIS — G4733 Obstructive sleep apnea (adult) (pediatric): Secondary | ICD-10-CM | POA: Diagnosis not present

## 2018-12-20 DIAGNOSIS — J329 Chronic sinusitis, unspecified: Secondary | ICD-10-CM | POA: Diagnosis not present

## 2018-12-20 DIAGNOSIS — B9689 Other specified bacterial agents as the cause of diseases classified elsewhere: Secondary | ICD-10-CM | POA: Diagnosis not present

## 2019-01-16 DIAGNOSIS — G4733 Obstructive sleep apnea (adult) (pediatric): Secondary | ICD-10-CM | POA: Diagnosis not present

## 2019-02-21 DIAGNOSIS — G4733 Obstructive sleep apnea (adult) (pediatric): Secondary | ICD-10-CM | POA: Diagnosis not present

## 2019-02-21 DIAGNOSIS — D649 Anemia, unspecified: Secondary | ICD-10-CM | POA: Diagnosis not present

## 2019-02-21 DIAGNOSIS — Z9989 Dependence on other enabling machines and devices: Secondary | ICD-10-CM | POA: Diagnosis not present

## 2019-04-18 DIAGNOSIS — Z0000 Encounter for general adult medical examination without abnormal findings: Secondary | ICD-10-CM | POA: Diagnosis not present

## 2019-04-18 DIAGNOSIS — E538 Deficiency of other specified B group vitamins: Secondary | ICD-10-CM | POA: Diagnosis not present

## 2019-04-18 DIAGNOSIS — Z8709 Personal history of other diseases of the respiratory system: Secondary | ICD-10-CM | POA: Diagnosis not present

## 2019-04-18 DIAGNOSIS — R635 Abnormal weight gain: Secondary | ICD-10-CM | POA: Diagnosis not present

## 2019-04-18 DIAGNOSIS — D649 Anemia, unspecified: Secondary | ICD-10-CM | POA: Diagnosis not present

## 2019-10-13 DIAGNOSIS — R8290 Unspecified abnormal findings in urine: Secondary | ICD-10-CM | POA: Diagnosis not present

## 2019-10-13 DIAGNOSIS — Z8709 Personal history of other diseases of the respiratory system: Secondary | ICD-10-CM | POA: Diagnosis not present

## 2019-10-13 DIAGNOSIS — Z0000 Encounter for general adult medical examination without abnormal findings: Secondary | ICD-10-CM | POA: Diagnosis not present

## 2019-10-13 DIAGNOSIS — R635 Abnormal weight gain: Secondary | ICD-10-CM | POA: Diagnosis not present

## 2019-10-20 DIAGNOSIS — K219 Gastro-esophageal reflux disease without esophagitis: Secondary | ICD-10-CM | POA: Diagnosis not present

## 2019-10-20 DIAGNOSIS — D649 Anemia, unspecified: Secondary | ICD-10-CM | POA: Diagnosis not present

## 2019-10-20 DIAGNOSIS — Z8709 Personal history of other diseases of the respiratory system: Secondary | ICD-10-CM | POA: Diagnosis not present

## 2020-02-27 IMAGING — CR DG CHEST 2V
2 series · 2 of 2 positions shown · non-contrast
Comparison: 01/29/2016

CLINICAL DATA: Chest pain

EXAM:
CHEST - 2 VIEW

[chest pa]
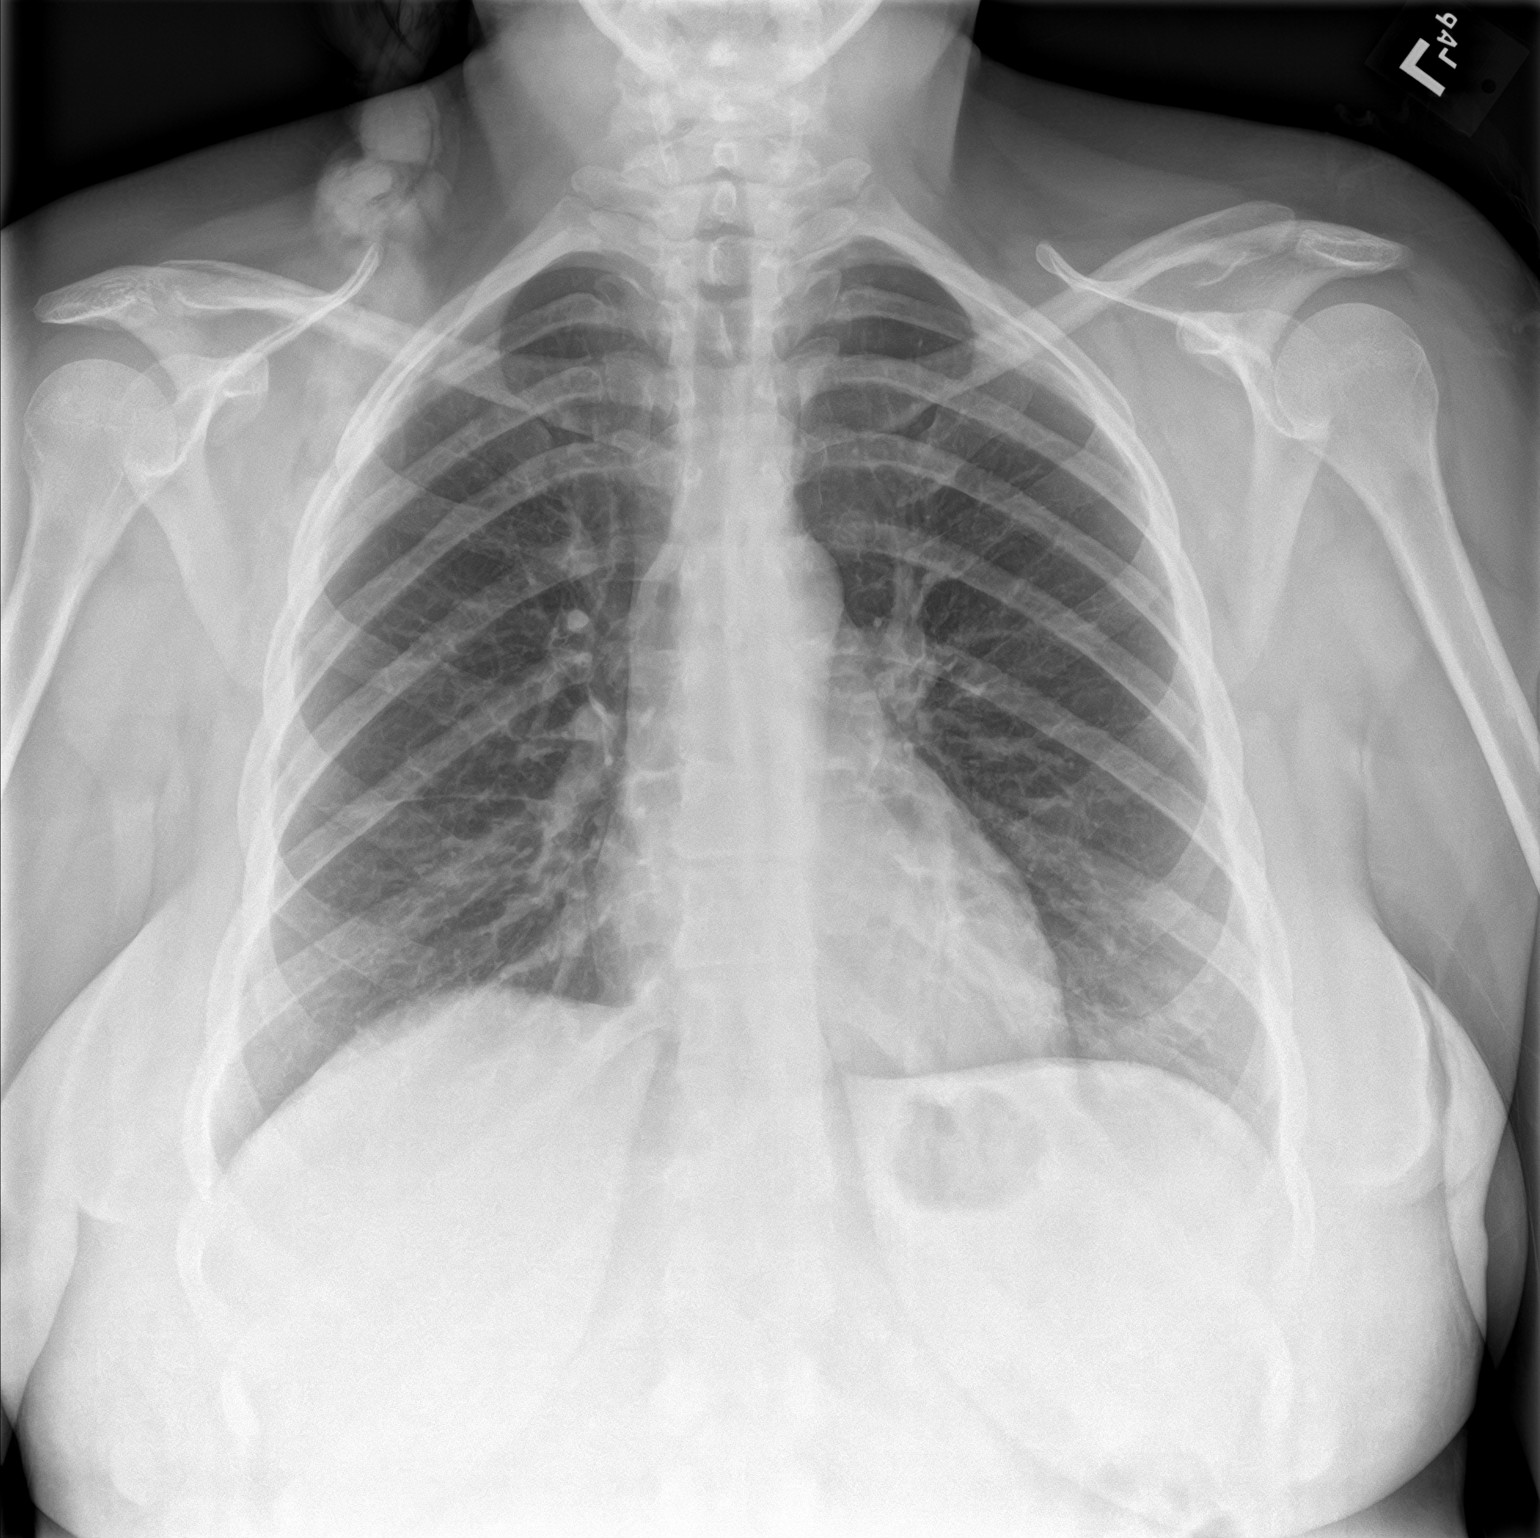

[chest lat]
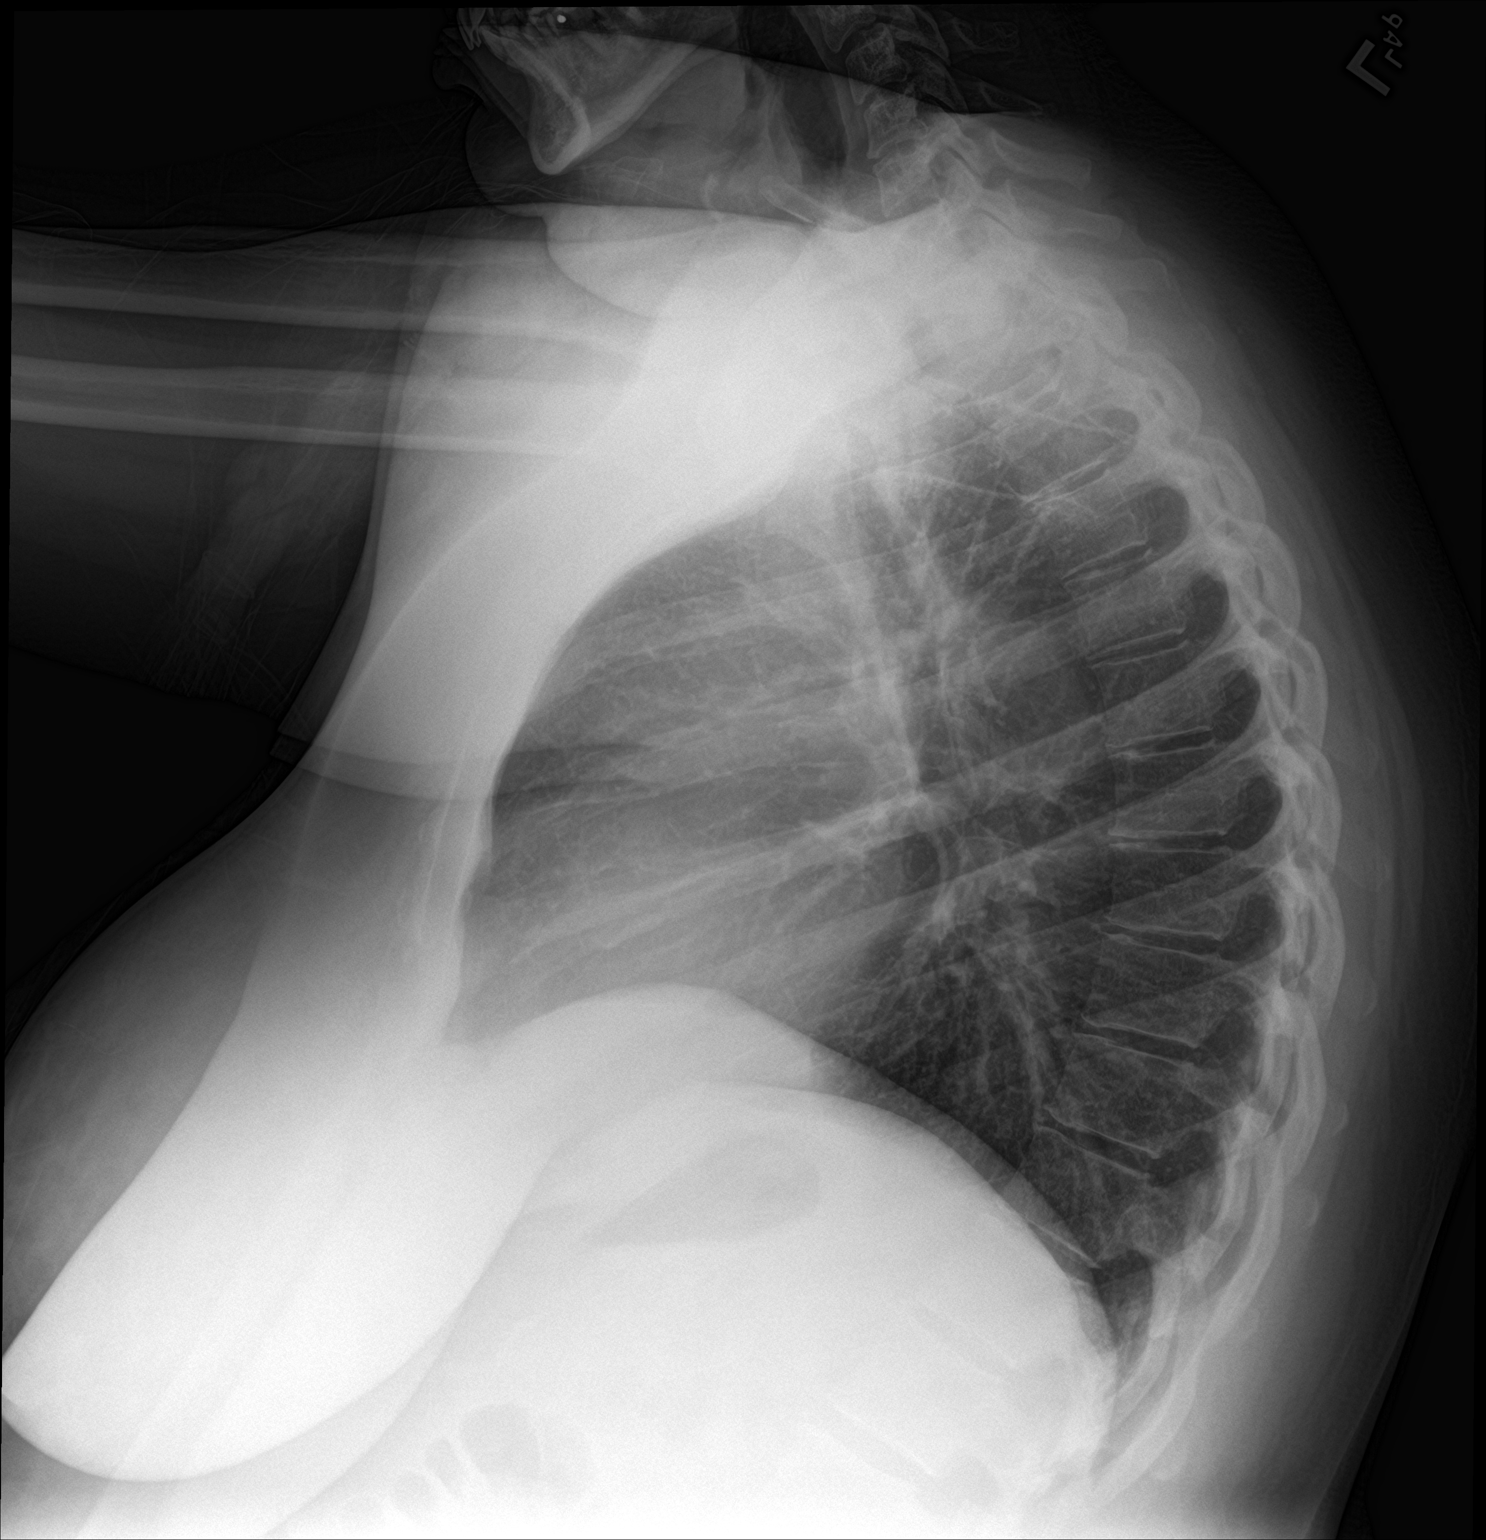

[2 of 2 positions shown; findings below may reference images not displayed]

FINDINGS: The heart size and mediastinal contours are within normal limits.
Both lungs are clear. The visualized skeletal structures are
unremarkable.
IMPRESSION: No active cardiopulmonary disease.

## 2020-04-13 DIAGNOSIS — R8290 Unspecified abnormal findings in urine: Secondary | ICD-10-CM | POA: Diagnosis not present

## 2020-04-13 DIAGNOSIS — D649 Anemia, unspecified: Secondary | ICD-10-CM | POA: Diagnosis not present

## 2020-04-13 DIAGNOSIS — Z79899 Other long term (current) drug therapy: Secondary | ICD-10-CM | POA: Diagnosis not present

## 2020-04-13 DIAGNOSIS — K219 Gastro-esophageal reflux disease without esophagitis: Secondary | ICD-10-CM | POA: Diagnosis not present

## 2020-04-13 DIAGNOSIS — Z8709 Personal history of other diseases of the respiratory system: Secondary | ICD-10-CM | POA: Diagnosis not present

## 2020-04-13 DIAGNOSIS — R790 Abnormal level of blood mineral: Secondary | ICD-10-CM | POA: Diagnosis not present

## 2020-04-20 DIAGNOSIS — G4733 Obstructive sleep apnea (adult) (pediatric): Secondary | ICD-10-CM | POA: Insufficient documentation

## 2020-04-20 DIAGNOSIS — Z0000 Encounter for general adult medical examination without abnormal findings: Secondary | ICD-10-CM | POA: Diagnosis not present

## 2020-04-20 DIAGNOSIS — R454 Irritability and anger: Secondary | ICD-10-CM | POA: Diagnosis not present

## 2020-04-20 DIAGNOSIS — K219 Gastro-esophageal reflux disease without esophagitis: Secondary | ICD-10-CM | POA: Diagnosis not present

## 2020-04-20 DIAGNOSIS — R790 Abnormal level of blood mineral: Secondary | ICD-10-CM | POA: Insufficient documentation

## 2020-04-20 DIAGNOSIS — F411 Generalized anxiety disorder: Secondary | ICD-10-CM | POA: Diagnosis not present

## 2020-04-20 DIAGNOSIS — D649 Anemia, unspecified: Secondary | ICD-10-CM | POA: Insufficient documentation

## 2020-04-27 ENCOUNTER — Inpatient Hospital Stay: Payer: 59

## 2020-04-27 ENCOUNTER — Encounter: Payer: Self-pay | Admitting: Oncology

## 2020-04-27 ENCOUNTER — Inpatient Hospital Stay: Payer: 59 | Attending: Oncology | Admitting: Oncology

## 2020-04-27 ENCOUNTER — Other Ambulatory Visit: Payer: Self-pay

## 2020-04-27 VITALS — BP 135/87 | HR 59 | Temp 96.1°F | Resp 18 | Ht 63.0 in | Wt 237.0 lb

## 2020-04-27 DIAGNOSIS — J45909 Unspecified asthma, uncomplicated: Secondary | ICD-10-CM | POA: Insufficient documentation

## 2020-04-27 DIAGNOSIS — D509 Iron deficiency anemia, unspecified: Secondary | ICD-10-CM | POA: Diagnosis not present

## 2020-04-27 DIAGNOSIS — R911 Solitary pulmonary nodule: Secondary | ICD-10-CM | POA: Insufficient documentation

## 2020-04-27 DIAGNOSIS — Z803 Family history of malignant neoplasm of breast: Secondary | ICD-10-CM | POA: Diagnosis not present

## 2020-04-27 DIAGNOSIS — R5383 Other fatigue: Secondary | ICD-10-CM | POA: Diagnosis not present

## 2020-04-27 DIAGNOSIS — E039 Hypothyroidism, unspecified: Secondary | ICD-10-CM | POA: Diagnosis not present

## 2020-04-27 DIAGNOSIS — R011 Cardiac murmur, unspecified: Secondary | ICD-10-CM | POA: Insufficient documentation

## 2020-04-27 DIAGNOSIS — E282 Polycystic ovarian syndrome: Secondary | ICD-10-CM | POA: Insufficient documentation

## 2020-04-27 DIAGNOSIS — Z79899 Other long term (current) drug therapy: Secondary | ICD-10-CM | POA: Insufficient documentation

## 2020-04-27 DIAGNOSIS — D539 Nutritional anemia, unspecified: Secondary | ICD-10-CM

## 2020-04-27 DIAGNOSIS — K219 Gastro-esophageal reflux disease without esophagitis: Secondary | ICD-10-CM | POA: Diagnosis not present

## 2020-04-27 DIAGNOSIS — D72829 Elevated white blood cell count, unspecified: Secondary | ICD-10-CM

## 2020-04-27 HISTORY — DX: Iron deficiency anemia, unspecified: D50.9

## 2020-04-27 LAB — CBC WITH DIFFERENTIAL/PLATELET
Abs Immature Granulocytes: 0.06 10*3/uL (ref 0.00–0.07)
Basophils Absolute: 0.1 10*3/uL (ref 0.0–0.1)
Basophils Relative: 0 %
Eosinophils Absolute: 0.4 10*3/uL (ref 0.0–0.5)
Eosinophils Relative: 3 %
HCT: 37 % (ref 36.0–46.0)
Hemoglobin: 11.5 g/dL — ABNORMAL LOW (ref 12.0–15.0)
Immature Granulocytes: 0 %
Lymphocytes Relative: 20 %
Lymphs Abs: 2.7 10*3/uL (ref 0.7–4.0)
MCH: 25.3 pg — ABNORMAL LOW (ref 26.0–34.0)
MCHC: 31.1 g/dL (ref 30.0–36.0)
MCV: 81.3 fL (ref 80.0–100.0)
Monocytes Absolute: 0.6 10*3/uL (ref 0.1–1.0)
Monocytes Relative: 4 %
Neutro Abs: 9.7 10*3/uL — ABNORMAL HIGH (ref 1.7–7.7)
Neutrophils Relative %: 73 %
Platelets: 433 10*3/uL — ABNORMAL HIGH (ref 150–400)
RBC: 4.55 MIL/uL (ref 3.87–5.11)
RDW: 14.6 % (ref 11.5–15.5)
WBC: 13.4 10*3/uL — ABNORMAL HIGH (ref 4.0–10.5)
nRBC: 0 % (ref 0.0–0.2)

## 2020-04-27 LAB — TECHNOLOGIST SMEAR REVIEW
Plt Morphology: NORMAL
RBC Morphology: NORMAL

## 2020-04-27 LAB — FERRITIN: Ferritin: 6 ng/mL — ABNORMAL LOW (ref 11–307)

## 2020-04-27 LAB — IRON AND TIBC
Iron: 17 ug/dL — ABNORMAL LOW (ref 28–170)
Saturation Ratios: 4 % — ABNORMAL LOW (ref 10.4–31.8)
TIBC: 455 ug/dL — ABNORMAL HIGH (ref 250–450)
UIBC: 438 ug/dL

## 2020-04-27 NOTE — Progress Notes (Signed)
Patient here to establish care for anemia. Pt reports feeling tired.

## 2020-04-27 NOTE — Progress Notes (Signed)
Hematology/Oncology Consult note [facility] [phone] [phone]   Patient Care Team: Barbette Reichmann, MD as PCP - General (Internal Medicine) Kieth Brightly, MD (General Surgery) Randa Spike, DO as Consulting Physician (Family Medicine)  REFERRING PROVIDER: Barbette Reichmann, MD CHIEF COMPLAINTS/REASON FOR VISIT:  Evaluation of iron deficiency anemia  HISTORY OF PRESENTING ILLNESS:  Kimberly Mcfarland is a  39 y.o.  female with PMH listed below was seen in consultation at the request of Barbette Reichmann, MD  for evaluation of iron deficiency anemia.   Reviewed patient's recent labs  Labs revealed anemia with hemoglobin of 11.6, MCV 82.9. Reviewed patient's previous labs ordered by primary care physician's office, anemia is chronic onset , duration is since May 2019.  Patient reports that at that time, she just gave birth to her son.  And soon after that, she has to had gallbladder removal surgery.  Associated signs and symptoms: Patient reports some fatigue.  Denies SOB with exertion.  Denies weight loss, easy bruising, hematochezia, hemoptysis, hematuria. Context:  History of iron deficiency: Patient cannot tolerate oral iron supplementation due to GI side effects. Menstrual bleeding/ Vaginal bleeding : Birth control implant with nexplanon Hematemesis or hemoptysis : denies Blood in urine : denies  Patient denies any family history of colon cancer. She reports that her sister has history of iron deficiency and has received IV iron treatments.  Review of Systems  Constitutional: Positive for fatigue. Negative for appetite change, chills and fever.  HENT:   Negative for hearing loss and voice change.   Eyes: Negative for eye problems.  Respiratory: Negative for chest tightness and cough.   Cardiovascular: Negative for chest pain.  Gastrointestinal: Negative for abdominal distention, abdominal pain and blood in stool.   Endocrine: Negative for hot flashes.  Genitourinary: Negative for difficulty urinating and frequency.   Musculoskeletal: Negative for arthralgias.  Skin: Negative for itching and rash.  Neurological: Negative for extremity weakness.  Hematological: Negative for adenopathy.  Psychiatric/Behavioral: Negative for confusion.    MEDICAL HISTORY:  Past Medical History:  Diagnosis Date  . Anemia    after pregnancy  . Asthma    well controlled-has not used inhaler in years  . Complication of anesthesia    difficulty voiding postop  . Family history of adverse reaction to anesthesia    mom woke up during surgery  . Family history of hemophilia 05/04/2014   patient has never been tested  . GERD (gastroesophageal reflux disease)   . Heart murmur    as child  . Hypothyroid    h/o outgrew  . IDA (iron deficiency anemia) 04/27/2020  . Multiple large sebaceous cysts of scalp 05/02/2018  . PCOS (polycystic ovarian syndrome)     SURGICAL HISTORY: Past Surgical History:  Procedure Laterality Date  . CHOLECYSTECTOMY N/A 03/08/2018   Procedure: LAPAROSCOPIC CHOLECYSTECTOMY;  Surgeon: Ancil Linsey, MD;  Location: [facility] ORS;  Service: General;  Laterality: N/A;  . CYST EXCISION  2010   head cysts x 14.  high levels of keratin  . LESION EXCISION N/A 02/16/2016   Procedure: EXCISION SCALP LESION,excision 2 large scalp cysrts with intermediate closure;  Surgeon: Kieth Brightly, MD;  Location: [facility] ORS;  Service: General;  Laterality: N/A;  . LESION EXCISION N/A 05/13/2018   Procedure: EXCISION SCALP LESION;  Surgeon: Ancil Linsey, MD;  Location: [facility] ORS;  Service: General;  Laterality: N/A;  . WISDOM TOOTH EXTRACTION      SOCIAL HISTORY: Social History  Socioeconomic History  . Marital status: Married    Spouse name: Not on file  . Number of children: 1  . Years of education: Not on file  . Highest education level: Not on file  Occupational History  . Not on file  Tobacco  Use  . Smoking status: Never Smoker  . Smokeless tobacco: Never Used  Substance and Sexual Activity  . Alcohol use: No    Alcohol/week: 0.0 standard drinks  . Drug use: No  . Sexual activity: Yes    Birth control/protection: None, Implant  Other Topics Concern  . Not on file  Social History Narrative  . Not on file   Social Determinants of Health   Financial Resource Strain:   . Difficulty of Paying Living Expenses:   Food Insecurity:   . Worried About Programme researcher, broadcasting/film/video in the Last Year:   . Barista in the Last Year:   Transportation Needs:   . Freight forwarder (Medical):   Marland Kitchen Lack of Transportation (Non-Medical):   Physical Activity:   . Days of Exercise per Week:   . Minutes of Exercise per Session:   Stress:   . Feeling of Stress :   Social Connections:   . Frequency of Communication with Friends and Family:   . Frequency of Social Gatherings with Friends and Family:   . Attends Religious Services:   . Active Member of Clubs or Organizations:   . Attends Banker Meetings:   Marland Kitchen Marital Status:   Intimate Partner Violence:   . Fear of Current or Ex-Partner:   . Emotionally Abused:   Marland Kitchen Physically Abused:   . Sexually Abused:     FAMILY HISTORY: Family History  Problem Relation Age of Onset  . Diabetes Mother   . Asthma Mother   . Obesity Mother   . Hypertension Mother   . Hemophilia Mother   . Heart disease Father   . Heart attack Father   . Testicular cancer Father   . Breast cancer Other   . Diabetes Maternal Grandmother        type 2  . Factor VIII deficiency Other        Also has factor 9 deficiency  . Factor VIII deficiency Other        also has factor 9 deficiency  . Hemophilia Maternal Grandfather   . Esophageal cancer Maternal Grandfather   . Pancreatic cancer Paternal Grandmother     ALLERGIES:  is allergic to sulfa antibiotics; levaquin [levofloxacin in d5w]; other; penicillins; and pepcid [famotidine].   MEDICATIONS:  Current Outpatient Medications  Medication Sig Dispense Refill  . acetaminophen (TYLENOL) 500 MG tablet Take 1,000 mg by mouth every 6 (six) hours as needed for headache.    . albuterol (PROVENTIL HFA;VENTOLIN HFA) 108 (90 BASE) MCG/ACT inhaler Inhale 2 puffs into the lungs every 4 (four) hours as needed for wheezing or shortness of breath.    . citalopram (CELEXA) 10 MG tablet Take 10 mg by mouth daily.    . cyanocobalamin 1000 MCG tablet Take 1,000 mcg by mouth daily.    Marland Kitchen etonogestrel (NEXPLANON) 68 MG IMPL implant 1 each (68 mg total) by Subdermal route once for 1 dose. 1 each 0  . pantoprazole (PROTONIX) 40 MG tablet Take 40 mg by mouth 2 (two) times daily.    Marland Kitchen omeprazole (PRILOSEC) 40 MG capsule Take 40 mg by mouth daily.    . ondansetron (ZOFRAN ODT) 4 MG disintegrating tablet  Allow 1-2 tablets to dissolve in your mouth every 8 hours as needed for nausea/vomiting (Patient not taking: Reported on 04/27/2020) 30 tablet 0  . oxyCODONE-acetaminophen (PERCOCET/ROXICET) 5-325 MG tablet Take 1-2 tablets by mouth every 4 (four) hours as needed for severe pain. (Patient not taking: Reported on 04/27/2020) 30 tablet 0   No current facility-administered medications for this visit.     PHYSICAL EXAMINATION: ECOG PERFORMANCE STATUS: 0 - Asymptomatic Vitals:   04/27/20 1449  BP: 135/87  Pulse: (!) 59  Resp: 18  Temp: (!) 96.1 F (35.6 C)   Filed Weights   04/27/20 1449  Weight: 237 lb (107.5 kg)    Physical Exam Constitutional:      General: She is not in acute distress. HENT:     Head: Normocephalic and atraumatic.  Eyes:     General: No scleral icterus. Cardiovascular:     Rate and Rhythm: Normal rate and regular rhythm.     Heart sounds: Normal heart sounds.  Pulmonary:     Effort: Pulmonary effort is normal. No respiratory distress.     Breath sounds: No wheezing.  Abdominal:     General: Bowel sounds are normal. There is no distension.     Palpations: Abdomen  is soft.  Musculoskeletal:        General: No deformity. Normal range of motion.     Cervical back: Normal range of motion and neck supple.  Skin:    General: Skin is warm and dry.     Findings: No erythema or rash.  Neurological:     Mental Status: She is alert and oriented to person, place, and time. Mental status is at baseline.     Cranial Nerves: No cranial nerve deficit.     Coordination: Coordination normal.  Psychiatric:        Mood and Affect: Mood normal.       CMP Latest Ref Rng & Units 10/29/2018  Glucose 70 - 99 mg/dL 427(C)  BUN 6 - 20 mg/dL 17  Creatinine 6.23 - 7.62 mg/dL 8.31  Sodium 517 - 616 mmol/L 139  Potassium 3.5 - 5.1 mmol/L 3.9  Chloride 98 - 111 mmol/L 106  CO2 22 - 32 mmol/L 25  Calcium 8.9 - 10.3 mg/dL 0.7(P)  Total Protein 6.5 - 8.1 g/dL 8.0  Total Bilirubin 0.3 - 1.2 mg/dL 0.6  Alkaline Phos 38 - 126 U/L 77  AST 15 - 41 U/L 18  ALT 0 - 44 U/L 23   CBC Latest Ref Rng & Units 04/27/2020  WBC 4.0 - 10.5 K/uL 13.4(H)  Hemoglobin 12.0 - 15.0 g/dL 11.5(L)  Hematocrit 36.0 - 46.0 % 37.0  Platelets 150 - 400 K/uL 433(H)     LABORATORY DATA:  I have reviewed the data as listed Lab Results  Component Value Date   WBC 13.4 (H) 04/27/2020   HGB 11.5 (L) 04/27/2020   HCT 37.0 04/27/2020   MCV 81.3 04/27/2020   PLT 433 (H) 04/27/2020   No results for input(s): NA, K, CL, CO2, GLUCOSE, BUN, CREATININE, CALCIUM, GFRNONAA, GFRAA, PROT, ALBUMIN, AST, ALT, ALKPHOS, BILITOT, BILIDIR, IBILI in the last 8760 hours. Iron/TIBC/Ferritin/ %Sat    Component Value Date/Time   IRON 17 (L) 04/27/2020 1532   TIBC 455 (H) 04/27/2020 1532   FERRITIN 6 (L) 04/27/2020 1532   IRONPCTSAT 4 (L) 04/27/2020 1532     RADIOGRAPHIC STUDIES: I have personally reviewed the radiological images as listed and agreed with the findings in the report. No  results found.     ASSESSMENT & PLAN:  1. Iron deficiency anemia, unspecified iron deficiency anemia type   2.  Leukocytosis, unspecified type   3. Lung nodule    Iron deficiency anemia, Previous labs were reviewed and discussed with patient. I would like to obtain CBC, smear, iron, TIBC ferritin to confirm. Patient cannot tolerate oral iron supplementation.  We discussed about the option of IV iron treatments. Plan IV iron with Venofer 200mg  weekly x 4 doses. Allergy reactions/infusion reaction including anaphylactic reaction discussed with patient. Other side effects include but not limited to high blood pressure, skin rash, weight gain, leg swelling, etc. Patient voices understanding and willing to proceed.  Leukocytosis, predominantly neutrophilia.  Leukocytosis has been chronic since at least 2019.  He does not have any constitutional symptoms.  Continue monitor.  I will order additional work-up at her next visit.  History of right lower lobe lung nodule on 03/07/2018 CT. I do not see any follow-up scans were done so far.  Discussed with patient at the next visit.  Orders Placed This Encounter  Procedures  . CBC with Differential/Platelet    Standing Status:   Future    Number of Occurrences:   1    Standing Expiration Date:   04/27/2021  . Iron and TIBC    Standing Status:   Future    Number of Occurrences:   1    Standing Expiration Date:   04/27/2021  . Ferritin    Standing Status:   Future    Number of Occurrences:   1    Standing Expiration Date:   04/27/2021  . Technologist smear review    Standing Status:   Future    Number of Occurrences:   1    Standing Expiration Date:   04/27/2021  . CBC with Differential/Platelet    Standing Status:   Future    Standing Expiration Date:   07/28/2021  . Ferritin    Standing Status:   Future    Standing Expiration Date:   07/28/2021  . Iron and TIBC    Standing Status:   Future    Standing Expiration Date:   07/28/2021    All questions were answered. The patient knows to call the clinic with any problems questions or concerns.  Cc Tracie Harrier,  MD  Return of visit: 3 months Thank you for this kind referral and the opportunity to participate in the care of this patient. A copy of today's note is routed to referring provider   Earlie Server, MD, PhD Hematology Oncology [place] at [facility] 04/27/2020

## 2020-05-03 ENCOUNTER — Inpatient Hospital Stay: Payer: 59

## 2020-05-03 ENCOUNTER — Other Ambulatory Visit: Payer: Self-pay

## 2020-05-03 VITALS — BP 109/68 | HR 60 | Temp 96.7°F | Resp 18

## 2020-05-03 DIAGNOSIS — D72829 Elevated white blood cell count, unspecified: Secondary | ICD-10-CM | POA: Diagnosis not present

## 2020-05-03 DIAGNOSIS — R911 Solitary pulmonary nodule: Secondary | ICD-10-CM | POA: Diagnosis not present

## 2020-05-03 DIAGNOSIS — R5383 Other fatigue: Secondary | ICD-10-CM | POA: Diagnosis not present

## 2020-05-03 DIAGNOSIS — K219 Gastro-esophageal reflux disease without esophagitis: Secondary | ICD-10-CM | POA: Diagnosis not present

## 2020-05-03 DIAGNOSIS — D509 Iron deficiency anemia, unspecified: Secondary | ICD-10-CM

## 2020-05-03 DIAGNOSIS — R011 Cardiac murmur, unspecified: Secondary | ICD-10-CM | POA: Diagnosis not present

## 2020-05-03 DIAGNOSIS — J45909 Unspecified asthma, uncomplicated: Secondary | ICD-10-CM | POA: Diagnosis not present

## 2020-05-03 DIAGNOSIS — E039 Hypothyroidism, unspecified: Secondary | ICD-10-CM | POA: Diagnosis not present

## 2020-05-03 DIAGNOSIS — E282 Polycystic ovarian syndrome: Secondary | ICD-10-CM | POA: Diagnosis not present

## 2020-05-03 MED ORDER — SODIUM CHLORIDE 0.9 % IV SOLN
Freq: Once | INTRAVENOUS | Status: AC
  Filled 2020-05-03: qty 250

## 2020-05-03 MED ORDER — IRON SUCROSE 20 MG/ML IV SOLN
200.0000 mg | Freq: Once | INTRAVENOUS | Status: AC
  Administered 2020-05-03: 200 mg via INTRAVENOUS
  Filled 2020-05-03: qty 10

## 2020-05-03 NOTE — Progress Notes (Signed)
Pt tolerated infusion well. Pt and VS stable at discharge.  

## 2020-05-10 ENCOUNTER — Inpatient Hospital Stay: Payer: 59

## 2020-05-10 ENCOUNTER — Other Ambulatory Visit: Payer: Self-pay

## 2020-05-10 VITALS — BP 113/78 | HR 63 | Temp 97.2°F | Resp 16

## 2020-05-10 DIAGNOSIS — J45909 Unspecified asthma, uncomplicated: Secondary | ICD-10-CM | POA: Diagnosis not present

## 2020-05-10 DIAGNOSIS — E282 Polycystic ovarian syndrome: Secondary | ICD-10-CM | POA: Diagnosis not present

## 2020-05-10 DIAGNOSIS — R5383 Other fatigue: Secondary | ICD-10-CM | POA: Diagnosis not present

## 2020-05-10 DIAGNOSIS — D72829 Elevated white blood cell count, unspecified: Secondary | ICD-10-CM | POA: Diagnosis not present

## 2020-05-10 DIAGNOSIS — K219 Gastro-esophageal reflux disease without esophagitis: Secondary | ICD-10-CM | POA: Diagnosis not present

## 2020-05-10 DIAGNOSIS — R011 Cardiac murmur, unspecified: Secondary | ICD-10-CM | POA: Diagnosis not present

## 2020-05-10 DIAGNOSIS — E039 Hypothyroidism, unspecified: Secondary | ICD-10-CM | POA: Diagnosis not present

## 2020-05-10 DIAGNOSIS — R911 Solitary pulmonary nodule: Secondary | ICD-10-CM | POA: Diagnosis not present

## 2020-05-10 DIAGNOSIS — D509 Iron deficiency anemia, unspecified: Secondary | ICD-10-CM | POA: Diagnosis not present

## 2020-05-10 MED ORDER — IRON SUCROSE 20 MG/ML IV SOLN
200.0000 mg | Freq: Once | INTRAVENOUS | Status: AC
  Administered 2020-05-10: 200 mg via INTRAVENOUS
  Filled 2020-05-10: qty 10

## 2020-05-10 MED ORDER — SODIUM CHLORIDE 0.9 % IV SOLN
Freq: Once | INTRAVENOUS | Status: AC
  Filled 2020-05-10: qty 250

## 2020-05-17 ENCOUNTER — Other Ambulatory Visit: Payer: Self-pay

## 2020-05-17 ENCOUNTER — Inpatient Hospital Stay: Payer: 59

## 2020-05-17 VITALS — BP 119/81 | HR 63 | Temp 97.0°F | Resp 18

## 2020-05-17 DIAGNOSIS — D72829 Elevated white blood cell count, unspecified: Secondary | ICD-10-CM | POA: Diagnosis not present

## 2020-05-17 DIAGNOSIS — R011 Cardiac murmur, unspecified: Secondary | ICD-10-CM | POA: Diagnosis not present

## 2020-05-17 DIAGNOSIS — R5383 Other fatigue: Secondary | ICD-10-CM | POA: Diagnosis not present

## 2020-05-17 DIAGNOSIS — D509 Iron deficiency anemia, unspecified: Secondary | ICD-10-CM

## 2020-05-17 DIAGNOSIS — R911 Solitary pulmonary nodule: Secondary | ICD-10-CM | POA: Diagnosis not present

## 2020-05-17 DIAGNOSIS — E282 Polycystic ovarian syndrome: Secondary | ICD-10-CM | POA: Diagnosis not present

## 2020-05-17 DIAGNOSIS — J45909 Unspecified asthma, uncomplicated: Secondary | ICD-10-CM | POA: Diagnosis not present

## 2020-05-17 DIAGNOSIS — K219 Gastro-esophageal reflux disease without esophagitis: Secondary | ICD-10-CM | POA: Diagnosis not present

## 2020-05-17 DIAGNOSIS — E039 Hypothyroidism, unspecified: Secondary | ICD-10-CM | POA: Diagnosis not present

## 2020-05-17 MED ORDER — IRON SUCROSE 20 MG/ML IV SOLN
200.0000 mg | Freq: Once | INTRAVENOUS | Status: AC
  Administered 2020-05-17: 200 mg via INTRAVENOUS
  Filled 2020-05-17: qty 10

## 2020-05-17 MED ORDER — SODIUM CHLORIDE 0.9 % IV SOLN
Freq: Once | INTRAVENOUS | Status: AC
  Filled 2020-05-17: qty 250

## 2020-07-28 ENCOUNTER — Other Ambulatory Visit: Payer: Self-pay

## 2020-07-28 ENCOUNTER — Inpatient Hospital Stay: Payer: 59 | Attending: Oncology | Admitting: Oncology

## 2020-07-28 DIAGNOSIS — Z79899 Other long term (current) drug therapy: Secondary | ICD-10-CM | POA: Diagnosis not present

## 2020-07-28 DIAGNOSIS — D509 Iron deficiency anemia, unspecified: Secondary | ICD-10-CM | POA: Insufficient documentation

## 2020-07-28 DIAGNOSIS — D72829 Elevated white blood cell count, unspecified: Secondary | ICD-10-CM

## 2020-07-28 DIAGNOSIS — R911 Solitary pulmonary nodule: Secondary | ICD-10-CM

## 2020-07-28 LAB — IRON AND TIBC
Iron: 37 ug/dL (ref 28–170)
Saturation Ratios: 9 % — ABNORMAL LOW (ref 10.4–31.8)
TIBC: 402 ug/dL (ref 250–450)
UIBC: 365 ug/dL

## 2020-07-28 LAB — CBC WITH DIFFERENTIAL/PLATELET
Abs Immature Granulocytes: 0.04 10*3/uL (ref 0.00–0.07)
Basophils Absolute: 0.1 10*3/uL (ref 0.0–0.1)
Basophils Relative: 1 %
Eosinophils Absolute: 0.5 10*3/uL (ref 0.0–0.5)
Eosinophils Relative: 4 %
HCT: 41.8 % (ref 36.0–46.0)
Hemoglobin: 13.8 g/dL (ref 12.0–15.0)
Immature Granulocytes: 0 %
Lymphocytes Relative: 22 %
Lymphs Abs: 2.8 10*3/uL (ref 0.7–4.0)
MCH: 28 pg (ref 26.0–34.0)
MCHC: 33 g/dL (ref 30.0–36.0)
MCV: 84.8 fL (ref 80.0–100.0)
Monocytes Absolute: 0.7 10*3/uL (ref 0.1–1.0)
Monocytes Relative: 6 %
Neutro Abs: 8.6 10*3/uL — ABNORMAL HIGH (ref 1.7–7.7)
Neutrophils Relative %: 67 %
Platelets: 374 10*3/uL (ref 150–400)
RBC: 4.93 MIL/uL (ref 3.87–5.11)
RDW: 14.7 % (ref 11.5–15.5)
WBC: 12.7 10*3/uL — ABNORMAL HIGH (ref 4.0–10.5)
nRBC: 0 % (ref 0.0–0.2)

## 2020-07-28 LAB — FERRITIN: Ferritin: 17 ng/mL (ref 11–307)

## 2020-07-30 ENCOUNTER — Encounter: Payer: Self-pay | Admitting: Oncology

## 2020-07-30 ENCOUNTER — Inpatient Hospital Stay (HOSPITAL_BASED_OUTPATIENT_CLINIC_OR_DEPARTMENT_OTHER): Payer: 59 | Admitting: Oncology

## 2020-07-30 ENCOUNTER — Inpatient Hospital Stay: Payer: 59

## 2020-07-30 DIAGNOSIS — R911 Solitary pulmonary nodule: Secondary | ICD-10-CM

## 2020-07-30 DIAGNOSIS — D72829 Elevated white blood cell count, unspecified: Secondary | ICD-10-CM

## 2020-07-30 DIAGNOSIS — D509 Iron deficiency anemia, unspecified: Secondary | ICD-10-CM | POA: Diagnosis not present

## 2020-07-30 NOTE — Progress Notes (Addendum)
HEMATOLOGY-ONCOLOGY TeleHEALTH VISIT PROGRESS NOTE  I connected with Kimberly Mcfarland on 07/30/20 at  2:00 PM EDT by video enabled telemedicine visit and verified that I am speaking with the correct person using two identifiers. I discussed the limitations, risks, security and privacy concerns of performing an evaluation and management service by telemedicine and the availability of in-person appointments. I also discussed with the patient that there may be a patient responsible charge related to this service. The patient expressed understanding and agreed to proceed.   Other persons participating in the visit and their role in the encounter:  None  Patient's location: Home  Provider's location: office Chief Complaint: Follow-up for iron deficiency anemia   INTERVAL HISTORY Kimberly Mcfarland is a 39 y.o. female who has above history reviewed by me today presents for follow up visit for management of iron deficiency anemia Problems and complaints are listed below:  Patient has had IV Venofer treatments.  She reports fatigue has significantly improved.  Feels well today.  Review of Systems  Constitutional: Negative for appetite change, chills, fatigue and fever.  HENT:   Negative for hearing loss and voice change.   Eyes: Negative for eye problems.  Respiratory: Negative for chest tightness and cough.   Cardiovascular: Negative for chest pain.  Gastrointestinal: Negative for abdominal distention, abdominal pain and blood in stool.  Endocrine: Negative for hot flashes.  Genitourinary: Negative for difficulty urinating and frequency.   Musculoskeletal: Negative for arthralgias.  Skin: Negative for itching and rash.  Neurological: Negative for extremity weakness.  Hematological: Negative for adenopathy.  Psychiatric/Behavioral: Negative for confusion.    Past Medical History:  Diagnosis Date  . Anemia    after pregnancy  . Asthma    well controlled-has not used inhaler in years  .  Complication of anesthesia    difficulty voiding postop  . Family history of adverse reaction to anesthesia    mom woke up during surgery  . Family history of hemophilia 05/04/2014   patient has never been tested  . GERD (gastroesophageal reflux disease)   . Heart murmur    as child  . Hypothyroid    h/o outgrew  . IDA (iron deficiency anemia) 04/27/2020  . Multiple large sebaceous cysts of scalp 05/02/2018  . PCOS (polycystic ovarian syndrome)    Past Surgical History:  Procedure Laterality Date  . CHOLECYSTECTOMY N/A 03/08/2018   Procedure: LAPAROSCOPIC CHOLECYSTECTOMY;  Surgeon: Vickie Epley, MD;  Location: [facility] ORS;  Service: General;  Laterality: N/A;  . CYST EXCISION  2010   head cysts x 14.  high levels of keratin  . LESION EXCISION N/A 02/16/2016   Procedure: EXCISION SCALP LESION,excision 2 large scalp cysrts with intermediate closure;  Surgeon: Christene Lye, MD;  Location: [facility] ORS;  Service: General;  Laterality: N/A;  . LESION EXCISION N/A 05/13/2018   Procedure: EXCISION SCALP LESION;  Surgeon: Vickie Epley, MD;  Location: [facility] ORS;  Service: General;  Laterality: N/A;  . WISDOM TOOTH EXTRACTION      Family History  Problem Relation Age of Onset  . Diabetes Mother   . Asthma Mother   . Obesity Mother   . Hypertension Mother   . Hemophilia Mother   . Heart disease Father   . Heart attack Father   . Testicular cancer Father   . Breast cancer Other   . Diabetes Maternal Grandmother        type 2  . Factor VIII deficiency Other  Also has factor 9 deficiency  . Factor VIII deficiency Other        also has factor 9 deficiency  . Hemophilia Maternal Grandfather   . Esophageal cancer Maternal Grandfather   . Pancreatic cancer Paternal Grandmother     Social History   Socioeconomic History  . Marital status: Married    Spouse name: Not on file  . Number of children: 1  . Years of education: Not on file  . Highest education level: Not on  file  Occupational History  . Not on file  Tobacco Use  . Smoking status: Never Smoker  . Smokeless tobacco: Never Used  Vaping Use  . Vaping Use: Never used  Substance and Sexual Activity  . Alcohol use: No    Alcohol/week: 0.0 standard drinks  . Drug use: No  . Sexual activity: Yes    Birth control/protection: None, Implant  Other Topics Concern  . Not on file  Social History Narrative  . Not on file   Social Determinants of Health   Financial Resource Strain:   . Difficulty of Paying Living Expenses:   Food Insecurity:   . Worried About Charity fundraiser in the Last Year:   . Arboriculturist in the Last Year:   Transportation Needs:   . Film/video editor (Medical):   Marland Kitchen Lack of Transportation (Non-Medical):   Physical Activity:   . Days of Exercise per Week:   . Minutes of Exercise per Session:   Stress:   . Feeling of Stress :   Social Connections:   . Frequency of Communication with Friends and Family:   . Frequency of Social Gatherings with Friends and Family:   . Attends Religious Services:   . Active Member of Clubs or Organizations:   . Attends Archivist Meetings:   Marland Kitchen Marital Status:   Intimate Partner Violence:   . Fear of Current or Ex-Partner:   . Emotionally Abused:   Marland Kitchen Physically Abused:   . Sexually Abused:     Current Outpatient Medications on File Prior to Visit  Medication Sig Dispense Refill  . acetaminophen (TYLENOL) 500 MG tablet Take 1,000 mg by mouth every 6 (six) hours as needed for headache.    . albuterol (PROVENTIL HFA;VENTOLIN HFA) 108 (90 BASE) MCG/ACT inhaler Inhale 2 puffs into the lungs every 4 (four) hours as needed for wheezing or shortness of breath.    . cyanocobalamin 1000 MCG tablet Take 1,000 mcg by mouth daily.    Marland Kitchen etonogestrel (NEXPLANON) 68 MG IMPL implant 1 each (68 mg total) by Subdermal route once for 1 dose. 1 each 0  . pantoprazole (PROTONIX) 40 MG tablet Take 40 mg by mouth 2 (two) times daily.     . citalopram (CELEXA) 10 MG tablet Take 10 mg by mouth daily. (Patient not taking: Reported on 07/30/2020)    . omeprazole (PRILOSEC) 40 MG capsule Take 40 mg by mouth daily. (Patient not taking: Reported on 07/30/2020)    . ondansetron (ZOFRAN ODT) 4 MG disintegrating tablet Allow 1-2 tablets to dissolve in your mouth every 8 hours as needed for nausea/vomiting (Patient not taking: Reported on 04/27/2020) 30 tablet 0  . oxyCODONE-acetaminophen (PERCOCET/ROXICET) 5-325 MG tablet Take 1-2 tablets by mouth every 4 (four) hours as needed for severe pain. (Patient not taking: Reported on 04/27/2020) 30 tablet 0   No current facility-administered medications on file prior to visit.    Allergies  Allergen Reactions  . Sulfa  Antibiotics Other (See Comments)    seizures  . Levaquin [Levofloxacin In D5w] Hives  . Other     dermabond  . Penicillins Hives  . Pepcid [Famotidine] Other (See Comments)    Dizziness       Observations/Objective: Today's Vitals   07/30/20 1400  PainSc: 0-No pain   There is no height or weight on file to calculate BMI.  Physical Exam Neurological:     Mental Status: She is alert.     CBC    Component Value Date/Time   WBC 12.7 (H) 07/28/2020 1256   RBC 4.93 07/28/2020 1256   HGB 13.8 07/28/2020 1256   HGB 10.8 (L) 02/12/2018 0906   HCT 41.8 07/28/2020 1256   HCT 32.3 (L) 02/12/2018 0906   PLT 374 07/28/2020 1256   PLT 252 02/12/2018 0906   MCV 84.8 07/28/2020 1256   MCV 88 02/12/2018 0906   MCV 93 07/17/2014 1812   MCH 28.0 07/28/2020 1256   MCHC 33.0 07/28/2020 1256   RDW 14.7 07/28/2020 1256   RDW 14.3 02/12/2018 0906   RDW 13.5 07/17/2014 1812   LYMPHSABS 2.8 07/28/2020 1256   LYMPHSABS 1.7 12/19/2017 0922   MONOABS 0.7 07/28/2020 1256   EOSABS 0.5 07/28/2020 1256   EOSABS 0.2 12/19/2017 0922   BASOSABS 0.1 07/28/2020 1256   BASOSABS 0.0 12/19/2017 0922    CMP     Component Value Date/Time   NA 139 10/29/2018 0311   NA 139 02/12/2018 0906    NA 139 07/17/2014 1812   K 3.9 10/29/2018 0311   K 4.0 07/17/2014 1812   CL 106 10/29/2018 0311   CL 108 (H) 07/17/2014 1812   CO2 25 10/29/2018 0311   CO2 22 07/17/2014 1812   GLUCOSE 127 (H) 10/29/2018 0311   GLUCOSE 78 07/17/2014 1812   BUN 17 10/29/2018 0311   BUN 6 02/12/2018 0906   BUN 9 07/17/2014 1812   CREATININE 0.61 10/29/2018 0311   CREATININE 0.60 07/17/2014 1812   CALCIUM 8.7 (L) 10/29/2018 0311   CALCIUM 8.2 (L) 07/17/2014 1812   PROT 8.0 10/29/2018 0311   PROT 6.0 02/12/2018 0906   ALBUMIN 4.2 10/29/2018 0311   ALBUMIN 3.1 (L) 02/12/2018 0906   AST 18 10/29/2018 0311   AST 15 07/17/2014 1812   ALT 23 10/29/2018 0311   ALKPHOS 77 10/29/2018 0311   BILITOT 0.6 10/29/2018 0311   BILITOT <0.2 02/12/2018 0906   GFRNONAA >60 10/29/2018 0311   GFRNONAA >60 07/17/2014 1812   GFRAA >60 10/29/2018 0311   GFRAA >60 07/17/2014 1812     Assessment and Plan: 1. Iron deficiency anemia, unspecified iron deficiency anemia type   2. Lung nodule   3. Leukocytosis, unspecified type     Iron deficiency anemia Labs reviewed and discussed with patient.  Anemia has completely resolved.  Iron panel still is consistent with iron deficiency. Patient has received IV Venofer treatments and tolerated well.  She cannot tolerate oral iron supplementation I recommend patient to proceed with IV Venofer 200 mg x 1.    Chronic leukocytosis, chronic problem for her.  No constitutional symptoms.  Likely reactive.  Flow cytometry has been obtained and result is pending.  History of right lower lobe lung nodule on 03/07/2018.  No follow-up scan after that.  Discussed with patient and recommend a follow-up CT scan.  Patient denies possibility of pregnancy.  LMP 07/21/2020.  I will obtain follow-up CT chest without contrast.  I discussed with patient that  I will discharge her to follow-up with primary care provider.  I am happy to see her if she develops related symptoms or concerns in the future.   She agrees with the plan.  I discussed the assessment and treatment plan with the patient. The patient was provided an opportunity to ask questions and all were answered. The patient agreed with the plan and demonstrated an understanding of the instructions.  The patient was advised to call back or seek an in-person evaluation if the symptoms worsen or if the condition fails to improve as anticipated.    Earlie Server, MD 07/30/2020 2:27 PM   Addendum CT chest showed stable lung nodules within right lung. Stable over 2 years. No need for follow up .  Flowcytometry showed increased Kappa/Lamda ratio. No other significant immunophenotypic abnormality.  Multiple myeloma panel showed no monoclonal protein. Normal kappa/lamda ratio. I will hold off additional work up.  Discharge to PCP.   Earlie Server

## 2020-07-30 NOTE — Progress Notes (Signed)
Pt contacted for Mychart visit. No new concerns voiced.  

## 2020-08-02 ENCOUNTER — Inpatient Hospital Stay: Payer: 59

## 2020-08-02 ENCOUNTER — Other Ambulatory Visit: Payer: Self-pay

## 2020-08-02 VITALS — BP 118/79 | HR 69 | Temp 97.0°F

## 2020-08-02 DIAGNOSIS — Z79899 Other long term (current) drug therapy: Secondary | ICD-10-CM | POA: Diagnosis not present

## 2020-08-02 DIAGNOSIS — D509 Iron deficiency anemia, unspecified: Secondary | ICD-10-CM | POA: Diagnosis not present

## 2020-08-02 LAB — COMP PANEL: LEUKEMIA/LYMPHOMA

## 2020-08-02 MED ORDER — SODIUM CHLORIDE 0.9 % IV SOLN
Freq: Once | INTRAVENOUS | Status: AC
  Filled 2020-08-02: qty 250

## 2020-08-02 MED ORDER — IRON SUCROSE 20 MG/ML IV SOLN
200.0000 mg | Freq: Once | INTRAVENOUS | Status: AC
  Administered 2020-08-02: 200 mg via INTRAVENOUS
  Filled 2020-08-02: qty 10

## 2020-08-03 ENCOUNTER — Telehealth: Payer: Self-pay

## 2020-08-03 DIAGNOSIS — D72829 Elevated white blood cell count, unspecified: Secondary | ICD-10-CM

## 2020-08-03 DIAGNOSIS — D509 Iron deficiency anemia, unspecified: Secondary | ICD-10-CM

## 2020-08-03 NOTE — Telephone Encounter (Signed)
-----  Message from Earlie Server, MD sent at 08/02/2020  9:47 PM EDT ----- Please let her know that I ordered a test for work up of her elevated wbc which has some findings that needs further testing.   I recommend additional work up. Please arrange her to do multiple myeloma panel and light chain ration. Please order. Thanks.

## 2020-08-03 NOTE — Telephone Encounter (Signed)
Pt notified and would like to come this Friday for labs (labs ordered). Time of CT also given, per pt request.

## 2020-08-03 NOTE — Telephone Encounter (Signed)
Done

## 2020-08-06 ENCOUNTER — Ambulatory Visit
Admission: RE | Admit: 2020-08-06 | Discharge: 2020-08-06 | Disposition: A | Discharge status: Home / Self Care | Payer: 59 | Source: Ambulatory Visit | Attending: Oncology | Admitting: Oncology

## 2020-08-06 ENCOUNTER — Inpatient Hospital Stay: Payer: 59

## 2020-08-06 ENCOUNTER — Other Ambulatory Visit: Payer: Self-pay

## 2020-08-06 DIAGNOSIS — D509 Iron deficiency anemia, unspecified: Secondary | ICD-10-CM

## 2020-08-06 DIAGNOSIS — R918 Other nonspecific abnormal finding of lung field: Secondary | ICD-10-CM | POA: Diagnosis not present

## 2020-08-06 DIAGNOSIS — D72829 Elevated white blood cell count, unspecified: Secondary | ICD-10-CM

## 2020-08-06 DIAGNOSIS — J9811 Atelectasis: Secondary | ICD-10-CM | POA: Diagnosis not present

## 2020-08-06 DIAGNOSIS — R911 Solitary pulmonary nodule: Secondary | ICD-10-CM | POA: Diagnosis not present

## 2020-08-09 DIAGNOSIS — Z7189 Other specified counseling: Secondary | ICD-10-CM | POA: Diagnosis not present

## 2020-08-09 DIAGNOSIS — U071 COVID-19: Secondary | ICD-10-CM | POA: Diagnosis not present

## 2020-08-09 DIAGNOSIS — Z03818 Encounter for observation for suspected exposure to other biological agents ruled out: Secondary | ICD-10-CM | POA: Diagnosis not present

## 2020-08-09 LAB — KAPPA/LAMBDA LIGHT CHAINS
Kappa free light chain: 18.7 mg/L (ref 3.3–19.4)
Kappa, lambda light chain ratio: 1.32 (ref 0.26–1.65)
Lambda free light chains: 14.2 mg/L (ref 5.7–26.3)

## 2020-08-10 LAB — MULTIPLE MYELOMA PANEL, SERUM
Albumin SerPl Elph-Mcnc: 3.5 g/dL (ref 2.9–4.4)
Albumin/Glob SerPl: 1.1 (ref 0.7–1.7)
Alpha 1: 0.2 g/dL (ref 0.0–0.4)
Alpha2 Glob SerPl Elph-Mcnc: 0.9 g/dL (ref 0.4–1.0)
B-Globulin SerPl Elph-Mcnc: 1 g/dL (ref 0.7–1.3)
Gamma Glob SerPl Elph-Mcnc: 1 g/dL (ref 0.4–1.8)
Globulin, Total: 3.2 g/dL (ref 2.2–3.9)
IgA: 165 mg/dL (ref 87–352)
IgG (Immunoglobin G), Serum: 1016 mg/dL (ref 586–1602)
IgM (Immunoglobulin M), Srm: 97 mg/dL (ref 26–217)
Total Protein ELP: 6.7 g/dL (ref 6.0–8.5)

## 2020-08-15 ENCOUNTER — Other Ambulatory Visit: Payer: Self-pay

## 2020-08-15 ENCOUNTER — Emergency Department: Payer: 59

## 2020-08-15 ENCOUNTER — Emergency Department
Admission: EM | Admit: 2020-08-15 | Discharge: 2020-08-15 | Disposition: A | Discharge status: Home / Self Care | Payer: 59 | Attending: Emergency Medicine | Admitting: Emergency Medicine

## 2020-08-15 ENCOUNTER — Encounter: Payer: Self-pay | Admitting: Emergency Medicine

## 2020-08-15 DIAGNOSIS — R0602 Shortness of breath: Secondary | ICD-10-CM | POA: Diagnosis not present

## 2020-08-15 DIAGNOSIS — E039 Hypothyroidism, unspecified: Secondary | ICD-10-CM | POA: Diagnosis not present

## 2020-08-15 DIAGNOSIS — Z880 Allergy status to penicillin: Secondary | ICD-10-CM | POA: Diagnosis not present

## 2020-08-15 DIAGNOSIS — J1282 Pneumonia due to coronavirus disease 2019: Secondary | ICD-10-CM | POA: Insufficient documentation

## 2020-08-15 DIAGNOSIS — U071 COVID-19: Secondary | ICD-10-CM | POA: Diagnosis not present

## 2020-08-15 DIAGNOSIS — J45909 Unspecified asthma, uncomplicated: Secondary | ICD-10-CM | POA: Diagnosis not present

## 2020-08-15 DIAGNOSIS — J189 Pneumonia, unspecified organism: Secondary | ICD-10-CM | POA: Diagnosis not present

## 2020-08-15 LAB — BASIC METABOLIC PANEL
Anion gap: 12 (ref 5–15)
BUN: 9 mg/dL (ref 6–20)
CO2: 24 mmol/L (ref 22–32)
Calcium: 8 mg/dL — ABNORMAL LOW (ref 8.9–10.3)
Chloride: 101 mmol/L (ref 98–111)
Creatinine, Ser: 0.58 mg/dL (ref 0.44–1.00)
GFR calc Af Amer: >60 mL/min (ref >60)
GFR calc non Af Amer: >60 mL/min (ref >60)
Glucose, Bld: 117 mg/dL — ABNORMAL HIGH (ref 70–99)
Potassium: 3 mmol/L — ABNORMAL LOW (ref 3.5–5.1)
Sodium: 137 mmol/L (ref 135–145)

## 2020-08-15 LAB — CBC
HCT: 36.5 % (ref 36.0–46.0)
Hemoglobin: 12.4 g/dL (ref 12.0–15.0)
MCH: 28.1 pg (ref 26.0–34.0)
MCHC: 34 g/dL (ref 30.0–36.0)
MCV: 82.8 fL (ref 80.0–100.0)
Platelets: 209 10*3/uL (ref 150–400)
RBC: 4.41 MIL/uL (ref 3.87–5.11)
RDW: 14.8 % (ref 11.5–15.5)
WBC: 5.1 10*3/uL (ref 4.0–10.5)
nRBC: 0 % (ref 0.0–0.2)

## 2020-08-15 LAB — TROPONIN I (HIGH SENSITIVITY): Troponin I (High Sensitivity): 3 ng/L (ref <18)

## 2020-08-15 NOTE — ED Triage Notes (Signed)
Patient states that she was diagnosed with Covid on Monday. Patient states that she started coughing a lot this morning and felt short of breath. Patient states that her oxygen saturation was in the high 80's at home.

## 2020-08-15 NOTE — ED Notes (Signed)
Pt assessment completed by MD prior to d/c

## 2020-08-15 NOTE — ED Provider Notes (Signed)
[facility] Emergency Department Provider Note   ____________________________________________    I have reviewed the triage vital signs and the nursing notes.   HISTORY  Chief Complaint Shortness of Breath     HPI Kimberly Mcfarland is a 39 y.o. female who presents with shortness of breath.  Patient reports she was diagnosed with COVID-19 8 days ago, she is not vaccinated.  She notes that she was coughing extensively last night and checked her pulse oximetry and found to be in the high 80s.  Currently she is feeling much better.  Does report body aches, occasional chills.  Past Medical History:  Diagnosis Date  . Anemia    after pregnancy  . Asthma    well controlled-has not used inhaler in years  . Complication of anesthesia    difficulty voiding postop  . Family history of adverse reaction to anesthesia    mom woke up during surgery  . Family history of hemophilia 05/04/2014   patient has never been tested  . GERD (gastroesophageal reflux disease)   . Heart murmur    as child  . Hypothyroid    h/o outgrew  . IDA (iron deficiency anemia) 04/27/2020  . Multiple large sebaceous cysts of scalp 05/02/2018  . PCOS (polycystic ovarian syndrome)     Patient Active Problem List   Diagnosis Date Noted  . IDA (iron deficiency anemia) 04/27/2020  . Lung nodule 04/27/2020  . Gastroesophageal reflux disease 05/17/2018  . History of asthma 05/17/2018  . Multiple large sebaceous cysts of scalp 05/02/2018  . Abdominal pain 03/07/2018  . Postpartum care following vaginal delivery 02/20/2018  . Preeclampsia 02/17/2018  . Antepartum mild preeclampsia 02/13/2018  . Family history of hemophilia 02/13/2018  . Obesity in pregnancy 02/13/2018  . Elevated blood pressure affecting pregnancy in third trimester, antepartum 02/05/2018  . Palpitations 01/26/2018  . Advanced maternal age in multigravida 09/12/2017  . Supervision of high-risk pregnancy of elderly  multigravida (>= 39 years old at time of delivery) 09/12/2017  . Hx of preeclampsia, prior pregnancy, currently pregnant 09/12/2017    Past Surgical History:  Procedure Laterality Date  . CHOLECYSTECTOMY N/A 03/08/2018   Procedure: LAPAROSCOPIC CHOLECYSTECTOMY;  Surgeon: Ancil Linsey, MD;  Location: [facility] ORS;  Service: General;  Laterality: N/A;  . CYST EXCISION  2010   head cysts x 14.  high levels of keratin  . LESION EXCISION N/A 02/16/2016   Procedure: EXCISION SCALP LESION,excision 2 large scalp cysrts with intermediate closure;  Surgeon: Kieth Brightly, MD;  Location: [facility] ORS;  Service: General;  Laterality: N/A;  . LESION EXCISION N/A 05/13/2018   Procedure: EXCISION SCALP LESION;  Surgeon: Ancil Linsey, MD;  Location: [facility] ORS;  Service: General;  Laterality: N/A;  . WISDOM TOOTH EXTRACTION      Prior to Admission medications   Medication Sig Start Date End Date Taking? Authorizing Provider  acetaminophen (TYLENOL) 500 MG tablet Take 1,000 mg by mouth every 6 (six) hours as needed for fever or headache.    Yes [provider]  ascorbic acid (VITAMIN C) 1000 MG tablet Take 1,000 mg by mouth daily.   Yes [provider]  Cholecalciferol (VITAMIN D-3) 125 MCG (5000 UT) TABS Take 10,000 Units by mouth daily.   Yes [provider]  cyanocobalamin 1000 MCG tablet Take 1,000 mcg by mouth daily. 05/21/18  Yes [provider]  ibuprofen (ADVIL) 200 MG tablet Take 400-600 mg by mouth every 6 (six) hours  as needed for fever or mild pain.   Yes [provider]  pantoprazole (PROTONIX) 40 MG tablet Take 40 mg by mouth 2 (two) times daily. 01/30/20  Yes [provider]  zinc gluconate 50 MG tablet Take 50 mg by mouth daily.   Yes [provider]  etonogestrel (NEXPLANON) 68 MG IMPL implant 1 each (68 mg total) by Subdermal route once for 1 dose. 03/28/18 07/30/20  Conard Novak, MD     Allergies Sulfa antibiotics,  Levaquin [levofloxacin in d5w], Other, Penicillins, and Pepcid [famotidine]  Family History  Problem Relation Age of Onset  . Diabetes Mother   . Asthma Mother   . Obesity Mother   . Hypertension Mother   . Hemophilia Mother   . Heart disease Father   . Heart attack Father   . Testicular cancer Father   . Breast cancer Other   . Diabetes Maternal Grandmother        type 2  . Factor VIII deficiency Other        Also has factor 9 deficiency  . Factor VIII deficiency Other        also has factor 9 deficiency  . Hemophilia Maternal Grandfather   . Esophageal cancer Maternal Grandfather   . Pancreatic cancer Paternal Grandmother     Social History Social History   Tobacco Use  . Smoking status: Never Smoker  . Smokeless tobacco: Never Used  Vaping Use  . Vaping Use: Never used  Substance Use Topics  . Alcohol use: No    Alcohol/week: 0.0 standard drinks  . Drug use: No    Review of Systems  Constitutional: Positive fevers chills Eyes: No visual changes.  ENT: No sore throat. Cardiovascular: Denies chest pain. Respiratory: As above Gastrointestinal: No abdominal pain.  No nausea, no vomiting.   Genitourinary: Negative for dysuria. Musculoskeletal: Myalgias Skin: Negative for rash. Neurological: Negative for headaches or weakness   ____________________________________________   PHYSICAL EXAM:  VITAL SIGNS: ED Triage Vitals  Enc Vitals Group     BP 08/15/20 0525 112/75     Pulse Rate 08/15/20 0525 82     Resp 08/15/20 0525 18     Temp 08/15/20 0525 100.1 F (37.8 C)     Temp Source 08/15/20 0525 Oral     SpO2 08/15/20 0525 95 %     Weight 08/15/20 0526 108.4 kg (239 lb)     Height 08/15/20 0526 1.6 m (5\' 3" )     Head Circumference --      Peak Flow --      Pain Score 08/15/20 0525 0     Pain Loc --      Pain Edu? --      Excl. in GC? --     Constitutional: Alert and oriented.   Nose: No congestion/rhinnorhea. Mouth/Throat: Mucous membranes are  moist.   Neck:  Painless ROM Cardiovascular: Normal rate, regular rhythm.   Good peripheral circulation. Respiratory: Normal respiratory effort.  No retractions. Gastrointestinal: Soft and nontender. No distention.  No CVA tenderness.  Musculoskeletal:   Warm and well perfused Neurologic:  Normal speech and language. No gross focal neurologic deficits are appreciated.  Skin:  Skin is warm, dry and intact. No rash noted. Psychiatric: Mood and affect are normal. Speech and behavior are normal.  ____________________________________________   LABS (all labs ordered are listed, but only abnormal results are displayed)  Labs Reviewed  BASIC METABOLIC PANEL - Abnormal; Notable for the following components:  Result Value   Potassium 3.0 (*)    Glucose, Bld 117 (*)    Calcium 8.0 (*)    All other components within normal limits  CBC  POC URINE PREG, ED  TROPONIN I (HIGH SENSITIVITY)  TROPONIN I (HIGH SENSITIVITY)   ____________________________________________  EKG  ED ECG REPORT I, Jene Every, the attending physician, personally viewed and interpreted this ECG.  Date: 08/15/2020  Rhythm: normal sinus rhythm QRS Axis: normal Intervals: normal ST/T Wave abnormalities: normal Narrative Interpretation: no evidence of acute ischemia  ____________________________________________  RADIOLOGY  Chest x-ray viewed by me, multifocal infiltrates consistent with COVID-19 pneumonia ____________________________________________   PROCEDURES  Procedure(s) performed: No  Procedures   Critical Care performed: No ____________________________________________   INITIAL IMPRESSION / ASSESSMENT AND PLAN / ED COURSE  Pertinent labs & imaging results that were available during my care of the patient were reviewed by me and considered in my medical decision making (see chart for details).  Patient with known COVID-19 presents with shortness of breath especially while coughing.   She is comfortable here in the emergency department.  Oxygen saturations 95% and above.  Lab work demonstrates normal white blood cell count, normal troponin, normal BMP.  Chest x-ray is consistent with COVID-19 pneumonia.  No indication for admission at this time, monoclonal antibody infusion center details given.    ____________________________________________   FINAL CLINICAL IMPRESSION(S) / ED DIAGNOSES  Final diagnoses:  Pneumonia due to COVID-19 virus        Note:  This document was prepared using Dragon voice recognition software and may include unintentional dictation errors.   Jene Every, MD 08/15/20 1350

## 2020-08-16 ENCOUNTER — Other Ambulatory Visit: Payer: Self-pay | Admitting: Physician Assistant

## 2020-08-16 ENCOUNTER — Encounter: Payer: Self-pay | Admitting: Physician Assistant

## 2020-08-16 ENCOUNTER — Telehealth: Payer: Self-pay | Admitting: Physician Assistant

## 2020-08-16 ENCOUNTER — Ambulatory Visit (HOSPITAL_COMMUNITY)
Admission: RE | Admit: 2020-08-16 | Discharge: 2020-08-16 | Disposition: A | Discharge status: Home / Self Care | Payer: 59 | Source: Ambulatory Visit | Attending: Pulmonary Disease | Admitting: Pulmonary Disease

## 2020-08-16 DIAGNOSIS — U071 COVID-19: Secondary | ICD-10-CM | POA: Insufficient documentation

## 2020-08-16 DIAGNOSIS — J1282 Pneumonia due to coronavirus disease 2019: Secondary | ICD-10-CM | POA: Diagnosis not present

## 2020-08-16 MED ORDER — DIPHENHYDRAMINE HCL 50 MG/ML IJ SOLN: 50.0000 mg | Freq: Once | INTRAMUSCULAR | Status: DC | PRN

## 2020-08-16 MED ORDER — ALBUTEROL SULFATE HFA 108 (90 BASE) MCG/ACT IN AERS: 2.0000 | INHALATION_SPRAY | Freq: Once | RESPIRATORY_TRACT | Status: DC | PRN

## 2020-08-16 MED ORDER — SODIUM CHLORIDE 0.9 % IV SOLN: INTRAVENOUS | Status: DC | PRN

## 2020-08-16 MED ORDER — FAMOTIDINE IN NACL 20-0.9 MG/50ML-% IV SOLN: 20.0000 mg | Freq: Once | INTRAVENOUS | Status: DC | PRN

## 2020-08-16 MED ORDER — METHYLPREDNISOLONE SODIUM SUCC 125 MG IJ SOLR: 125.0000 mg | Freq: Once | INTRAMUSCULAR | Status: DC | PRN

## 2020-08-16 MED ORDER — EPINEPHRINE 0.3 MG/0.3ML IJ SOAJ: 0.3000 mg | Freq: Once | INTRAMUSCULAR | Status: DC | PRN

## 2020-08-16 MED ORDER — SODIUM CHLORIDE 0.9 % IV SOLN
1200.0000 mg | Freq: Once | INTRAVENOUS | Status: AC
  Administered 2020-08-16: 1200 mg via INTRAVENOUS
  Filled 2020-08-16: qty 10

## 2020-08-16 NOTE — Telephone Encounter (Signed)
Called to discuss with patient about Covid symptoms and the use of casirivimab/imdevimab, a monoclonal antibody infusion for those with mild to moderate Covid symptoms and at a high risk of hospitalization.  Pt is qualified for this infusion at the [place] infusion center due to; Specific high risk criteria : BMI > 25   Message left to call back our hotline [phone] and sent my chart message.  Bryona Foxworthy PA-C  [facility]    

## 2020-08-16 NOTE — Discharge Instructions (Signed)

## 2020-08-16 NOTE — Progress Notes (Signed)
I connected by phone with Kimberly Mcfarland on 08/16/2020 at 10:00 AM to discuss the potential use of a new treatment for mild to moderate COVID-19 viral infection in non-hospitalized patients.  This patient is a 39 y.o. female that meets the FDA criteria for Emergency Use Authorization of COVID monoclonal antibody casirivimab/imdevimab.  Has a (+) direct SARS-CoV-2 viral test result  Has mild or moderate COVID-19   Is NOT hospitalized due to COVID-19  Is within 10 days of symptom onset  Has at least one of the high risk factor(s) for progression to severe COVID-19 and/or hospitalization as defined in EUA.  Specific high risk criteria : BMI > 25   I have spoken and communicated the following to the patient or parent/caregiver regarding COVID monoclonal antibody treatment:  1. FDA has authorized the emergency use for the treatment of mild to moderate COVID-19 in adults and pediatric patients with positive results of direct SARS-CoV-2 viral testing who are 49 years of age and older weighing at least 40 kg, and who are at high risk for progressing to severe COVID-19 and/or hospitalization.  2. The significant known and potential risks and benefits of COVID monoclonal antibody, and the extent to which such potential risks and benefits are unknown.  3. Information on available alternative treatments and the risks and benefits of those alternatives, including clinical trials.  4. Patients treated with COVID monoclonal antibody should continue to self-isolate and use infection control measures (e.g., wear mask, isolate, social distance, avoid sharing personal items, clean and disinfect "high touch" surfaces, and frequent handwashing) according to CDC guidelines.   5. The patient or parent/caregiver has the option to accept or refuse COVID monoclonal antibody treatment.  After reviewing this information with the patient, The patient agreed to proceed with receiving casirivimab\imdevimab infusion and  will be provided a copy of the Fact sheet prior to receiving the infusion.  Sx onset 8/14. Set up for infusion on 8/23 @ 5pm. Directions given to [facility]. Pt is aware that insurance will be charged an infusion fee. She is not vaccinated.   [name] 08/16/2020 10:00 AM

## 2020-08-16 NOTE — Progress Notes (Signed)
°  Diagnosis: COVID-19 ° °Physician:dr wright ° °Procedure: [facility]fusion [facility] Med: casirivimab\imdevimab infusion - Provided patient with casirivimab\imdevimab fact sheet for patients, parents and caregivers prior to infusion. ° °Complications: No immediate complications noted. ° °Discharge: Discharged home  ° °Kimberly Mcfarland °08/16/2020 ° °

## 2020-12-25 ENCOUNTER — Other Ambulatory Visit: Payer: Self-pay

## 2020-12-25 ENCOUNTER — Encounter: Payer: Self-pay | Admitting: Emergency Medicine

## 2020-12-25 ENCOUNTER — Emergency Department
Admission: EM | Admit: 2020-12-25 | Discharge: 2020-12-25 | Disposition: A | Discharge status: Home / Self Care | Payer: 59 | Attending: Emergency Medicine | Admitting: Emergency Medicine

## 2020-12-25 DIAGNOSIS — R12 Heartburn: Secondary | ICD-10-CM | POA: Diagnosis present

## 2020-12-25 DIAGNOSIS — K2100 Gastro-esophageal reflux disease with esophagitis, without bleeding: Secondary | ICD-10-CM | POA: Diagnosis not present

## 2020-12-25 DIAGNOSIS — J45909 Unspecified asthma, uncomplicated: Secondary | ICD-10-CM | POA: Insufficient documentation

## 2020-12-25 DIAGNOSIS — E039 Hypothyroidism, unspecified: Secondary | ICD-10-CM | POA: Diagnosis not present

## 2020-12-25 MED ORDER — GI COCKTAIL ~~LOC~~
30.0000 mL | Freq: Three times a day (TID) | ORAL | 0 refills | Status: DC
Start: 2020-12-25 — End: 2024-02-05

## 2020-12-25 MED ORDER — SUCRALFATE 1 G PO TABS: 1.0000 g | ORAL_TABLET | Freq: Four times a day (QID) | ORAL | 0 refills | Status: AC

## 2020-12-25 NOTE — ED Triage Notes (Signed)
Patient with complaint of acid reflux times three days. Patient states that her protonix stopped working. Patient states that she was seen yesterday at urgent care for the same and was switched to pepcid. Patient states that she has taken OTC medication without relief. Patient complaint of acid and burning in her throat.

## 2020-12-25 NOTE — Discharge Instructions (Signed)
Keep your appointment with your primary care provider. Avoid caffeine-containing foods and read information about food choices with reflux disease. Begin taking Carafate 1 before meals and at bedtime.  Also a GI cocktail was prescribed if needed.  This medication contains lidocaine with that and may cause some numbness of your throat.

## 2020-12-25 NOTE — ED Provider Notes (Signed)
[facility] Emergency Department Provider Note   ____________________________________________   Event Date/Time   First MD Initiated Contact with Patient 12/25/20 [phone]     (approximate)  I have reviewed the triage vital signs and the nursing notes.   HISTORY  Chief Complaint Heartburn   HPI Kimberly Mcfarland is a 40 y.o. female presents to the ED with complaint of acid reflux for the last 3 days.  Patient states that for years she has taken Protonix and this has quit working for her.  She was seen in urgent care yesterday and was switched to Pepcid which is not helping.  Patient states that she wakes every morning with acid and burning in her throat.  Patient states that this started after having her gallbladder removed.  Patient denies any changes from her regular reflux symptoms.  She denies any chest pain, shortness of breath, difficulty breathing or history of cardiac disease.  Patient has an appointment with her doctor on the 10th.  She reports that she has eaten chocolate and drink coffee which tends to aggravate her reflux over the holidays.         Past Medical History:  Diagnosis Date  . Anemia    after pregnancy  . Asthma    well controlled-has not used inhaler in years  . Complication of anesthesia    difficulty voiding postop  . Family history of adverse reaction to anesthesia    mom woke up during surgery  . Family history of hemophilia 05/04/2014   patient has never been tested  . GERD (gastroesophageal reflux disease)   . Heart murmur    as child  . Hypothyroid    h/o outgrew  . IDA (iron deficiency anemia) 04/27/2020  . Morbid obesity (HCC)   . Multiple large sebaceous cysts of scalp 05/02/2018  . PCOS (polycystic ovarian syndrome)     Patient Active Problem List   Diagnosis Date Noted  . IDA (iron deficiency anemia) 04/27/2020  . Lung nodule 04/27/2020  . Gastroesophageal reflux disease 05/17/2018  . History of asthma 05/17/2018   . Multiple large sebaceous cysts of scalp 05/02/2018  . Abdominal pain 03/07/2018  . Postpartum care following vaginal delivery 02/20/2018  . Preeclampsia 02/17/2018  . Antepartum mild preeclampsia 02/13/2018  . Family history of hemophilia 02/13/2018  . Obesity in pregnancy 02/13/2018  . Elevated blood pressure affecting pregnancy in third trimester, antepartum 02/05/2018  . Palpitations 01/26/2018  . Advanced maternal age in multigravida 09/12/2017  . Supervision of high-risk pregnancy of elderly multigravida (>= 37 years old at time of delivery) 09/12/2017  . Hx of preeclampsia, prior pregnancy, currently pregnant 09/12/2017    Past Surgical History:  Procedure Laterality Date  . CHOLECYSTECTOMY N/A 03/08/2018   Procedure: LAPAROSCOPIC CHOLECYSTECTOMY;  Surgeon: Ancil Linsey, MD;  Location: [facility] ORS;  Service: General;  Laterality: N/A;  . CYST EXCISION  2010   head cysts x 14.  high levels of keratin  . LESION EXCISION N/A 02/16/2016   Procedure: EXCISION SCALP LESION,excision 2 large scalp cysrts with intermediate closure;  Surgeon: Kieth Brightly, MD;  Location: [facility] ORS;  Service: General;  Laterality: N/A;  . LESION EXCISION N/A 05/13/2018   Procedure: EXCISION SCALP LESION;  Surgeon: Ancil Linsey, MD;  Location: [facility] ORS;  Service: General;  Laterality: N/A;  . WISDOM TOOTH EXTRACTION      Prior to Admission medications   Medication Sig Start Date End Date Taking? Authorizing Provider  Alum &  Mag Hydroxide-Simeth (GI COCKTAIL) SUSP suspension Take 30 mLs by mouth 4 (four) times daily -  before meals and at bedtime. Shake well. 12/25/20  Yes Letitia Neri L, PA-C  sucralfate (CARAFATE) 1 g tablet Take 1 tablet (1 g total) by mouth 4 (four) times daily. 12/25/20 12/25/21 Yes Johnn Hai, PA-C  acetaminophen (TYLENOL) 500 MG tablet Take 1,000 mg by mouth every 6 (six) hours as needed for fever or headache.     [provider]  ascorbic acid (VITAMIN C)  1000 MG tablet Take 1,000 mg by mouth daily.    [provider]  Cholecalciferol (VITAMIN D-3) 125 MCG (5000 UT) TABS Take 10,000 Units by mouth daily.    [provider]  cyanocobalamin 1000 MCG tablet Take 1,000 mcg by mouth daily. 05/21/18   [provider]  etonogestrel (NEXPLANON) 68 MG IMPL implant 1 each (68 mg total) by Subdermal route once for 1 dose. 03/28/18 07/30/20  Will Bonnet, MD  ibuprofen (ADVIL) 200 MG tablet Take 400-600 mg by mouth every 6 (six) hours as needed for fever or mild pain.    [provider]  pantoprazole (PROTONIX) 40 MG tablet Take 40 mg by mouth 2 (two) times daily. 01/30/20   [provider]  zinc gluconate 50 MG tablet Take 50 mg by mouth daily.    [provider]    Allergies Sulfa antibiotics, Levaquin [levofloxacin in d5w], Other, Penicillins, and Pepcid [famotidine]  Family History  Problem Relation Age of Onset  . Diabetes Mother   . Asthma Mother   . Obesity Mother   . Hypertension Mother   . Hemophilia Mother   . Heart disease Father   . Heart attack Father   . Testicular cancer Father   . Breast cancer Other   . Diabetes Maternal Grandmother        type 2  . Factor VIII deficiency Other        Also has factor 9 deficiency  . Factor VIII deficiency Other        also has factor 9 deficiency  . Hemophilia Maternal Grandfather   . Esophageal cancer Maternal Grandfather   . Pancreatic cancer Paternal Grandmother     Social History Social History   Tobacco Use  . Smoking status: Never Smoker  . Smokeless tobacco: Never Used  Vaping Use  . Vaping Use: Never used  Substance Use Topics  . Alcohol use: No    Alcohol/week: 0.0 standard drinks  . Drug use: No    Review of Systems Constitutional: No fever/chills Eyes: No visual changes. ENT: No sore throat. Cardiovascular: Denies chest pain. Respiratory: Denies shortness of breath. Gastrointestinal: Positive for reflux disease.   No abdominal pain.  No nausea, no vomiting.  No diarrhea.  Genitourinary: Negative for dysuria. Musculoskeletal: Negative for back pain. Skin: Negative for rash. Neurological: Negative for headaches, focal weakness or numbness. ____________________________________________   PHYSICAL EXAM:  VITAL SIGNS: ED Triage Vitals  Enc Vitals Group     BP 12/25/20 0455 (!) 139/97     Pulse Rate 12/25/20 0455 83     Resp 12/25/20 0455 18     Temp 12/25/20 0455 98.5 F (36.9 C)     Temp Source 12/25/20 0455 Oral     SpO2 12/25/20 0455 99 %     Weight 12/25/20 0456 243 lb (110.2 kg)     Height 12/25/20 0456 5\' 4"  (1.626 m)     Head Circumference --  Peak Flow --      Pain Score 12/25/20 0455 6     Pain Loc --      Pain Edu? --      Excl. in GC? --     Constitutional: Alert and oriented. Well appearing and in no acute distress. Eyes: Conjunctivae are normal. PERRL. EOMI. Head: Atraumatic. Nose: No congestion/rhinnorhea. Mouth/Throat: Mucous membranes are moist.  Oropharynx non-erythematous. Neck: No stridor.   Cardiovascular: Normal rate, regular rhythm. Grossly normal heart sounds.  Good peripheral circulation. Respiratory: Normal respiratory effort.  No retractions. Lungs CTAB. Gastrointestinal: Soft and nontender. No distention.  No epigastric tenderness noted.  Bowel sounds normoactive x4 quadrants.  Musculoskeletal: Moves upper and lower extremities any difficulty.  Normal gait was noted. Neurologic:  Normal speech and language. No gross focal neurologic deficits are appreciated. No gait instability. Skin:  Skin is warm, dry and intact. No rash noted. Psychiatric: Mood and affect are normal. Speech and behavior are normal.  ____________________________________________   LABS (all labs ordered are listed, but only abnormal results are displayed)  Labs Reviewed - No data to display  PROCEDURES  Procedure(s) performed (including Critical  Care):  Procedures   ____________________________________________   INITIAL IMPRESSION / ASSESSMENT AND PLAN / ED COURSE  As part of my medical decision making, I reviewed the following data within the electronic MEDICAL RECORD NUMBER Notes from prior ED visits and [place] Controlled Substance Database  40 year old female presents to the ED with complaint of worsening reflux disease during the holidays while drinking coffee and eating chocolate.  She states that consuming caffeine tends to make her reflux worse and that she has been on Protonix for some time which stopped working for her.  Patient was seen at urgent care at [facility] where she was switched to Pepcid which is not helping at all.  Patient states that each morning she wakes with a acid/burning sensation in her throat.  She has an appointment with her PCP next week.  Was given a prescription for Carafate 1 g before meals and at bedtime and a prescription for a GI cocktail to take before meals and at bedtime as needed.  She is to return to the emergency department if any severe worsening of her symptoms.  ____________________________________________   FINAL CLINICAL IMPRESSION(S) / ED DIAGNOSES  Final diagnoses:  Gastroesophageal reflux disease with esophagitis without hemorrhage     ED Discharge Orders         Ordered    sucralfate (CARAFATE) 1 g tablet  4 times daily        12/25/20 1034    Alum & Mag Hydroxide-Simeth (GI COCKTAIL) SUSP suspension  3 times daily before meals & bedtime       Note to Pharmacy: Malum & Mag Hydroxide-Simeth with Lidociane HCL   12/25/20 1034          *Please note:  Kimberly Mcfarland was evaluated in Emergency Department on 12/25/2020 for the symptoms described in the history of present illness. She was evaluated in the context of the global COVID-19 pandemic, which necessitated consideration that the patient might be at risk for infection with the SARS-CoV-2 virus that causes COVID-19.  Institutional protocols and algorithms that pertain to the evaluation of patients at risk for COVID-19 are in a state of rapid change based on information released by regulatory bodies including the CDC and federal and state organizations. These policies and algorithms were followed during the patient's care in the ED.  Some ED  evaluations and interventions may be delayed as a result of limited staffing during and the pandemic.*   Note:  This document was prepared using Dragon voice recognition software and may include unintentional dictation errors.    Tommi Rumps, PA-C 12/25/20 1353    Minna Antis, MD 12/25/20 [phone]

## 2020-12-29 ENCOUNTER — Other Ambulatory Visit: Payer: Self-pay | Admitting: Emergency Medicine

## 2020-12-29 ENCOUNTER — Other Ambulatory Visit: Payer: Self-pay

## 2020-12-29 ENCOUNTER — Emergency Department
Admission: EM | Admit: 2020-12-29 | Discharge: 2020-12-29 | Disposition: A | Discharge status: Home / Self Care | Payer: 59 | Attending: Emergency Medicine | Admitting: Emergency Medicine

## 2020-12-29 ENCOUNTER — Encounter: Payer: Self-pay | Admitting: *Deleted

## 2020-12-29 DIAGNOSIS — K219 Gastro-esophageal reflux disease without esophagitis: Secondary | ICD-10-CM | POA: Insufficient documentation

## 2020-12-29 DIAGNOSIS — E039 Hypothyroidism, unspecified: Secondary | ICD-10-CM | POA: Diagnosis not present

## 2020-12-29 DIAGNOSIS — J45909 Unspecified asthma, uncomplicated: Secondary | ICD-10-CM | POA: Insufficient documentation

## 2020-12-29 LAB — CBC
HCT: 37.6 % (ref 36.0–46.0)
Hemoglobin: 12.5 g/dL (ref 12.0–15.0)
MCH: 29.1 pg (ref 26.0–34.0)
MCHC: 33.2 g/dL (ref 30.0–36.0)
MCV: 87.6 fL (ref 80.0–100.0)
Platelets: 335 10*3/uL (ref 150–400)
RBC: 4.29 MIL/uL (ref 3.87–5.11)
RDW: 12.8 % (ref 11.5–15.5)
WBC: 11.8 10*3/uL — ABNORMAL HIGH (ref 4.0–10.5)
nRBC: 0 % (ref 0.0–0.2)

## 2020-12-29 LAB — COMPREHENSIVE METABOLIC PANEL
ALT: 17 U/L (ref 0–44)
AST: 17 U/L (ref 15–41)
Albumin: 3.8 g/dL (ref 3.5–5.0)
Alkaline Phosphatase: 57 U/L (ref 38–126)
Anion gap: 11 (ref 5–15)
BUN: 8 mg/dL (ref 6–20)
CO2: 24 mmol/L (ref 22–32)
Calcium: 8.6 mg/dL — ABNORMAL LOW (ref 8.9–10.3)
Chloride: 105 mmol/L (ref 98–111)
Creatinine, Ser: 0.57 mg/dL (ref 0.44–1.00)
GFR, Estimated: >60 mL/min (ref >60)
Glucose, Bld: 121 mg/dL — ABNORMAL HIGH (ref 70–99)
Potassium: 3.1 mmol/L — ABNORMAL LOW (ref 3.5–5.1)
Sodium: 140 mmol/L (ref 135–145)
Total Bilirubin: 0.8 mg/dL (ref 0.3–1.2)
Total Protein: 7.2 g/dL (ref 6.5–8.1)

## 2020-12-29 LAB — LIPASE, BLOOD: Lipase: 18 U/L (ref 11–51)

## 2020-12-29 MED ORDER — DEXILANT 30 MG PO CPDR: 30.0000 mg | DELAYED_RELEASE_CAPSULE | Freq: Every day | ORAL | 1 refills | Status: DC

## 2020-12-29 MED ORDER — LIDOCAINE VISCOUS HCL 2 % MT SOLN
15.0000 mL | Freq: Once | OROMUCOSAL | Status: AC
  Administered 2020-12-29: 15 mL via ORAL
  Filled 2020-12-29: qty 15

## 2020-12-29 MED ORDER — ALUM & MAG HYDROXIDE-SIMETH 200-200-20 MG/5ML PO SUSP
30.0000 mL | Freq: Once | ORAL | Status: AC
  Administered 2020-12-29: 30 mL via ORAL
  Filled 2020-12-29: qty 30

## 2020-12-29 NOTE — ED Triage Notes (Signed)
Pt was seen here 12/25/20 for acid reflux.  Pt has switched meds without relief.  Pt taking carafate,  protonix and gas x.  Pt states burning in throat and is unable to sleep.  Pt alert  Speech clear.

## 2020-12-29 NOTE — ED Provider Notes (Signed)
[facility] Emergency Department Provider Note   ____________________________________________   Event Date/Time   First MD Initiated Contact with Patient 12/29/20 [phone]     (approximate)  I have reviewed the triage vital signs and the nursing notes.   HISTORY  Chief Complaint Gastroesophageal Reflux    HPI Kimberly Mcfarland is a 40 y.o. female with history of acid reflux who presents to the emergency department with burning discomfort that has been ongoing for several days.  States she is previously on Protonix once a day.  She was seen in the emergency department and switched to Prilosec.  She is also taking famotidine, Carafate.  She has been given a prescription for viscous lidocaine but states that her pharmacy told her they did not have this.  She has had nausea but no vomiting.  She is taking Zofran with good relief.  Does not have a gastroenterologist for follow-up.  Is hoping that we could adjust her medications to provide her with symptomatic relief.  Reports she has changed her diet and has not been eating anything other than chicken noodle soup over the past several days without much relief.  States symptoms are much worse when she is sleeping at night.  She has to sleep in a recliner to prevent having acid in her mouth regularly.         Past Medical History:  Diagnosis Date  . Anemia    after pregnancy  . Asthma    well controlled-has not used inhaler in years  . Complication of anesthesia    difficulty voiding postop  . Family history of adverse reaction to anesthesia    mom woke up during surgery  . Family history of hemophilia 05/04/2014   patient has never been tested  . GERD (gastroesophageal reflux disease)   . Heart murmur    as child  . Hypothyroid    h/o outgrew  . IDA (iron deficiency anemia) 04/27/2020  . Morbid obesity (HCC)   . Multiple large sebaceous cysts of scalp 05/02/2018  . PCOS (polycystic ovarian syndrome)      Patient Active Problem List   Diagnosis Date Noted  . IDA (iron deficiency anemia) 04/27/2020  . Lung nodule 04/27/2020  . Gastroesophageal reflux disease 05/17/2018  . History of asthma 05/17/2018  . Multiple large sebaceous cysts of scalp 05/02/2018  . Abdominal pain 03/07/2018  . Postpartum care following vaginal delivery 02/20/2018  . Preeclampsia 02/17/2018  . Antepartum mild preeclampsia 02/13/2018  . Family history of hemophilia 02/13/2018  . Obesity in pregnancy 02/13/2018  . Elevated blood pressure affecting pregnancy in third trimester, antepartum 02/05/2018  . Palpitations 01/26/2018  . Advanced maternal age in multigravida 09/12/2017  . Supervision of high-risk pregnancy of elderly multigravida (>= 52 years old at time of delivery) 09/12/2017  . Hx of preeclampsia, prior pregnancy, currently pregnant 09/12/2017    Past Surgical History:  Procedure Laterality Date  . CHOLECYSTECTOMY N/A 03/08/2018   Procedure: LAPAROSCOPIC CHOLECYSTECTOMY;  Surgeon: Ancil Linsey, MD;  Location: [facility] ORS;  Service: General;  Laterality: N/A;  . CYST EXCISION  2010   head cysts x 14.  high levels of keratin  . LESION EXCISION N/A 02/16/2016   Procedure: EXCISION SCALP LESION,excision 2 large scalp cysrts with intermediate closure;  Surgeon: Kieth Brightly, MD;  Location: [facility] ORS;  Service: General;  Laterality: N/A;  . LESION EXCISION N/A 05/13/2018   Procedure: EXCISION SCALP LESION;  Surgeon: Ancil Linsey, MD;  Location:  [facility] ORS;  Service: General;  Laterality: N/A;  . WISDOM TOOTH EXTRACTION      Prior to Admission medications   Medication Sig Start Date End Date Taking? Authorizing Provider  acetaminophen (TYLENOL) 500 MG tablet Take 1,000 mg by mouth every 6 (six) hours as needed for fever or headache.     [provider]  Alum & Mag Hydroxide-Simeth (GI COCKTAIL) SUSP suspension Take 30 mLs by mouth 4 (four) times daily -  before meals and at bedtime.  Shake well. 12/25/20   Tommi Rumps, PA-C  ascorbic acid (VITAMIN C) 1000 MG tablet Take 1,000 mg by mouth daily.    [provider]  Cholecalciferol (VITAMIN D-3) 125 MCG (5000 UT) TABS Take 10,000 Units by mouth daily.    [provider]  cyanocobalamin 1000 MCG tablet Take 1,000 mcg by mouth daily. 05/21/18   [provider]  etonogestrel (NEXPLANON) 68 MG IMPL implant 1 each (68 mg total) by Subdermal route once for 1 dose. 03/28/18 07/30/20  Conard Novak, MD  ibuprofen (ADVIL) 200 MG tablet Take 400-600 mg by mouth every 6 (six) hours as needed for fever or mild pain.    [provider]  pantoprazole (PROTONIX) 40 MG tablet Take 40 mg by mouth 2 (two) times daily. 01/30/20   [provider]  sucralfate (CARAFATE) 1 g tablet Take 1 tablet (1 g total) by mouth 4 (four) times daily. 12/25/20 12/25/21  Tommi Rumps, PA-C  zinc gluconate 50 MG tablet Take 50 mg by mouth daily.    [provider]    Allergies Sulfa antibiotics, Levaquin [levofloxacin in d5w], Other, Penicillins, and Pepcid [famotidine]  Family History  Problem Relation Age of Onset  . Diabetes Mother   . Asthma Mother   . Obesity Mother   . Hypertension Mother   . Hemophilia Mother   . Heart disease Father   . Heart attack Father   . Testicular cancer Father   . Breast cancer Other   . Diabetes Maternal Grandmother        type 2  . Factor VIII deficiency Other        Also has factor 9 deficiency  . Factor VIII deficiency Other        also has factor 9 deficiency  . Hemophilia Maternal Grandfather   . Esophageal cancer Maternal Grandfather   . Pancreatic cancer Paternal Grandmother     Social History Social History   Tobacco Use  . Smoking status: Never Smoker  . Smokeless tobacco: Never Used  Vaping Use  . Vaping Use: Never used  Substance Use Topics  . Alcohol use: No    Alcohol/week: 0.0 standard drinks  . Drug use: No    Review of  Systems  Constitutional: No fever/chills Eyes: No visual changes. ENT: No sore throat. Cardiovascular: Denies chest pain. Respiratory: Denies shortness of breath. Gastrointestinal: No abdominal pain.  No nausea, no vomiting.  No diarrhea.  No constipation. Genitourinary: Negative for dysuria. Musculoskeletal: Negative for back pain. Skin: Negative for rash. Neurological: Negative for headaches, focal weakness or numbness.   ____________________________________________   PHYSICAL EXAM:  VITAL SIGNS: ED Triage Vitals  Enc Vitals Group     BP 12/29/20 0218 137/77     Pulse Rate 12/29/20 0218 66     Resp 12/29/20 0218 18     Temp 12/29/20 0218 98.5 F (36.9 C)     Temp Source 12/29/20 0218 Oral     SpO2 12/29/20  0218 99 %     Weight 12/29/20 0216 242 lb (109.8 kg)     Height 12/29/20 0216 5\' 3"  (1.6 m)     Head Circumference --      Peak Flow --      Pain Score 12/29/20 0216 0     Pain Loc --      Pain Edu? --      Excl. in [place]? --     Constitutional: Alert and oriented. Well appearing and in no acute distress. Eyes: Conjunctivae are normal. PERRL. EOMI. Head: Atraumatic. Nose: No congestion/rhinnorhea. Mouth/Throat: Mucous membranes are moist.  Oropharynx non-erythematous.  Normal phonation. Neck: No stridor.   Cardiovascular: Normal rate, regular rhythm. Grossly normal heart sounds.  Good peripheral circulation. Respiratory: Normal respiratory effort.  No retractions. Lungs CTAB. Gastrointestinal: Soft and nontender. No distention. No abdominal bruits. No CVA tenderness. Musculoskeletal: No lower extremity tenderness nor edema.  No joint effusions. Neurologic:  Normal speech and language. No gross focal neurologic deficits are appreciated. No gait instability. Skin:  Skin is warm, dry and intact. No rash noted. Psychiatric: Mood and affect are normal. Speech and behavior are normal.  ____________________________________________   LABS (all labs ordered are listed,  but only abnormal results are displayed)  Labs Reviewed  COMPREHENSIVE METABOLIC PANEL - Abnormal; Notable for the following components:      Result Value   Potassium 3.1 (*)    Glucose, Bld 121 (*)    Calcium 8.6 (*)    All other components within normal limits  CBC - Abnormal; Notable for the following components:   WBC 11.8 (*)    All other components within normal limits  LIPASE, BLOOD   ____________________________________________  EKG  None ____________________________________________  RADIOLOGY I, Baya Lentz, personally viewed and evaluated these images (plain radiographs) as part of my medical decision making, as well as reviewing the written report by the radiologist.  ED MD interpretation:  None  Official radiology report(s): No results found.  ____________________________________________   PROCEDURES  Procedure(s) performed (including Critical Care):  Procedures   ____________________________________________   INITIAL IMPRESSION / ASSESSMENT AND PLAN / ED COURSE  As part of my medical decision making, I reviewed the following data within the [place] notes reviewed and incorporated, Notes from prior ED visits and [place] Controlled Substance Database        Patient here with acid reflux.  Requesting changes in her medication to see if this helps with symptoms.  Will prescribe Dexilant.  Discussed with patient that I recommend close follow-up with GI to determine whether or not she needs an endoscopy.  She may benefit from H. pylori testing and increasing her PPI to twice daily.  She has prescription for viscous lidocaine which she states she will take to a different pharmacy.  She is on Carafate and has Zofran as well.  She does not need refills of any of her other medications.  Her labs here are reassuring.  Abdominal exam is benign.  No sign of GI bleed.  No hematemesis, bloody stools or melena.  Given normal labs and benign  abdominal exam doubt pancreatitis, choledocholithiasis.  She is status post cholecystectomy.  Discussed importance of continued diet and lifestyle modification.      ____________________________________________   FINAL CLINICAL IMPRESSION(S) / ED DIAGNOSES  Final diagnoses:  Gastroesophageal reflux disease, unspecified whether esophagitis present     ED Discharge Orders         Ordered  Dexlansoprazole (DEXILANT) 30 MG capsule  Daily,   Status:  Discontinued        12/29/20 0506    Dexlansoprazole (DEXILANT) 30 MG capsule  Daily        12/29/20 0530          *Please note:  MITSUE PEERY was evaluated in Emergency Department on 12/29/2020 for the symptoms described in the history of present illness. She was evaluated in the context of the global COVID-19 pandemic, which necessitated consideration that the patient might be at risk for infection with the SARS-CoV-2 virus that causes COVID-19. Institutional protocols and algorithms that pertain to the evaluation of patients at risk for COVID-19 are in a state of rapid change based on information released by regulatory bodies including the CDC and federal and state organizations. These policies and algorithms were followed during the patient's care in the ED.  Some ED evaluations and interventions may be delayed as a result of limited staffing during and the pandemic.*   Note:  This document was prepared using Dragon voice recognition software and may include unintentional dictation errors.    Markice Torbert, Layla Maw, DO 12/29/20 [phone]

## 2021-05-25 ENCOUNTER — Other Ambulatory Visit: Payer: Self-pay | Admitting: Internal Medicine

## 2021-05-25 DIAGNOSIS — Z1231 Encounter for screening mammogram for malignant neoplasm of breast: Secondary | ICD-10-CM

## 2022-12-07 ENCOUNTER — Encounter: Payer: Self-pay | Admitting: Oncology

## 2022-12-09 ENCOUNTER — Encounter: Payer: Self-pay | Admitting: Oncology

## 2022-12-27 ENCOUNTER — Inpatient Hospital Stay: Payer: BC Managed Care – PPO | Attending: Oncology | Admitting: Oncology

## 2022-12-27 ENCOUNTER — Inpatient Hospital Stay: Payer: BC Managed Care – PPO

## 2022-12-27 ENCOUNTER — Encounter: Payer: Self-pay | Admitting: Oncology

## 2022-12-27 VITALS — BP 134/100 | HR 69 | Temp 97.9°F | Resp 18 | Ht 63.0 in | Wt 228.0 lb

## 2022-12-27 DIAGNOSIS — J45909 Unspecified asthma, uncomplicated: Secondary | ICD-10-CM | POA: Insufficient documentation

## 2022-12-27 DIAGNOSIS — R5383 Other fatigue: Secondary | ICD-10-CM | POA: Insufficient documentation

## 2022-12-27 DIAGNOSIS — E039 Hypothyroidism, unspecified: Secondary | ICD-10-CM | POA: Insufficient documentation

## 2022-12-27 DIAGNOSIS — E282 Polycystic ovarian syndrome: Secondary | ICD-10-CM | POA: Insufficient documentation

## 2022-12-27 DIAGNOSIS — K219 Gastro-esophageal reflux disease without esophagitis: Secondary | ICD-10-CM | POA: Diagnosis not present

## 2022-12-27 DIAGNOSIS — D509 Iron deficiency anemia, unspecified: Secondary | ICD-10-CM

## 2022-12-27 DIAGNOSIS — R531 Weakness: Secondary | ICD-10-CM | POA: Insufficient documentation

## 2022-12-27 DIAGNOSIS — E538 Deficiency of other specified B group vitamins: Secondary | ICD-10-CM | POA: Insufficient documentation

## 2022-12-27 DIAGNOSIS — Z79899 Other long term (current) drug therapy: Secondary | ICD-10-CM | POA: Diagnosis not present

## 2022-12-27 DIAGNOSIS — E669 Obesity, unspecified: Secondary | ICD-10-CM | POA: Insufficient documentation

## 2022-12-27 LAB — IRON AND TIBC
Iron: 30 ug/dL (ref 28–170)
Saturation Ratios: 6 % — ABNORMAL LOW (ref 10.4–31.8)
TIBC: 521 ug/dL — ABNORMAL HIGH (ref 250–450)
UIBC: 491 ug/dL

## 2022-12-27 LAB — FOLATE: Folate: 10.3 ng/mL (ref >5.9)

## 2022-12-27 LAB — CBC
HCT: 34.5 % — ABNORMAL LOW (ref 36.0–46.0)
Hemoglobin: 10.5 g/dL — ABNORMAL LOW (ref 12.0–15.0)
MCH: 23 pg — ABNORMAL LOW (ref 26.0–34.0)
MCHC: 30.4 g/dL (ref 30.0–36.0)
MCV: 75.5 fL — ABNORMAL LOW (ref 80.0–100.0)
Platelets: 406 10*3/uL — ABNORMAL HIGH (ref 150–400)
RBC: 4.57 MIL/uL (ref 3.87–5.11)
RDW: 16 % — ABNORMAL HIGH (ref 11.5–15.5)
WBC: 10.2 10*3/uL (ref 4.0–10.5)
nRBC: 0 % (ref 0.0–0.2)

## 2022-12-27 LAB — FERRITIN: Ferritin: 4 ng/mL — ABNORMAL LOW (ref 11–307)

## 2022-12-27 LAB — VITAMIN B12: Vitamin B-12: 299 pg/mL (ref 180–914)

## 2022-12-27 NOTE — Progress Notes (Signed)
Pt had gastritis in 2021; she has flare ups ever since then. She gets heartburn terribly. She cannot tolerate oral iron at all. Saw Dr Tasia Catchings in 2021 and was dismissed from clinic stating she was good. Ferritin is now 4. She did receive IV iron . Still menstrating, heavier menses which is normal for her. Has lightheadedness, dyspnea with activity. Zero energy. No Blood in stools. Has not had a colonoscopy ever. EGD in 2022

## 2022-12-27 NOTE — Progress Notes (Addendum)
[facility]  Telephone:([phone] [phone]) [phone]  ID: Kimberly Mcfarland OB: 1981/02/10  MR#: [id number]  AOZ#:[id number]  Patient Care Team: Barbette Reichmann, MD as PCP - General (Internal Medicine) Randa Spike, DO as Consulting Physician (Family Medicine)  CHIEF COMPLAINT: Iron deficiency anemia.  INTERVAL HISTORY: Patient is a 42 year old female with a history of iron deficiency anemia requiring iron infusions.  She has noticed increased weakness and fatigue, but otherwise feels well.  She has no neurologic complaints.  She denies any recent fevers or illnesses.  She has a good appetite and denies weight loss.  She has no chest pain, shortness of breath, cough, or hemoptysis.  She denies any nausea, vomiting, constipation, or diarrhea.  She has no melena or hematochezia.  She has no urinary complaints.  Patient otherwise feels well and offers no further specific complaints today.  REVIEW OF SYSTEMS:   Review of Systems  Constitutional:  Positive for malaise/fatigue. Negative for fever and weight loss.  Respiratory: Negative.  Negative for cough, hemoptysis and shortness of breath.   Cardiovascular: Negative.  Negative for chest pain and leg swelling.  Gastrointestinal:  Negative for abdominal pain, blood in stool and melena.  Genitourinary: Negative.  Negative for hematuria.  Musculoskeletal: Negative.  Negative for back pain.  Skin: Negative.  Negative for rash.  Neurological:  Positive for weakness. Negative for dizziness and focal weakness.  Psychiatric/Behavioral: Negative.  The patient is not nervous/anxious.     As per HPI. Otherwise, a complete review of systems is negative.  PAST MEDICAL HISTORY: Past Medical History:  Diagnosis Date   Anemia    after pregnancy   Asthma    well controlled-has not used inhaler in years   Complication of anesthesia    difficulty voiding postop   Family history of adverse reaction to anesthesia    mom woke  up during surgery   Family history of hemophilia 05/04/2014   patient has never been tested   GERD (gastroesophageal reflux disease)    Heart murmur    as child   Hypothyroid    h/o outgrew   IDA (iron deficiency anemia) 04/27/2020   Morbid obesity (HCC)    Multiple large sebaceous cysts of scalp 05/02/2018   PCOS (polycystic ovarian syndrome)     PAST SURGICAL HISTORY: Past Surgical History:  Procedure Laterality Date   CHOLECYSTECTOMY N/A 03/08/2018   Procedure: LAPAROSCOPIC CHOLECYSTECTOMY;  Surgeon: Ancil Linsey, MD;  Location: [facility] ORS;  Service: General;  Laterality: N/A;   CYST EXCISION  2010   head cysts x 14.  high levels of keratin   LESION EXCISION N/A 02/16/2016   Procedure: EXCISION SCALP LESION,excision 2 large scalp cysrts with intermediate closure;  Surgeon: Kieth Brightly, MD;  Location: [facility] ORS;  Service: General;  Laterality: N/A;   LESION EXCISION N/A 05/13/2018   Procedure: EXCISION SCALP LESION;  Surgeon: Ancil Linsey, MD;  Location: [facility] ORS;  Service: General;  Laterality: N/A;   WISDOM TOOTH EXTRACTION      FAMILY HISTORY: Family History  Problem Relation Age of Onset   Diabetes Mother    Asthma Mother    Obesity Mother    Hypertension Mother    Hemophilia Mother    Heart disease Father    Heart attack Father    Testicular cancer Father    Breast cancer Other    Diabetes Maternal Grandmother        type 2   Factor VIII deficiency Other  Also has factor 9 deficiency   Factor VIII deficiency Other        also has factor 9 deficiency   Hemophilia Maternal Grandfather    Esophageal cancer Maternal Grandfather    Pancreatic cancer Paternal Grandmother     ADVANCED DIRECTIVES (Y/N):  N  HEALTH MAINTENANCE: Social History   Tobacco Use   Smoking status: Never   Smokeless tobacco: Never  Vaping Use   Vaping Use: Never used  Substance Use Topics   Alcohol use: No    Alcohol/week: 0.0 standard drinks of alcohol   Drug  use: No     Colonoscopy:  PAP:  Bone density:  Lipid panel:  Allergies  Allergen Reactions   Sulfa Antibiotics Other (See Comments)    seizures   Levaquin [Levofloxacin In D5w] Hives   Other     dermabond   Penicillins Hives   Pepcid [Famotidine] Other (See Comments)    Dizziness    Current Outpatient Medications  Medication Sig Dispense Refill   acetaminophen (TYLENOL) 500 MG tablet Take 1,000 mg by mouth every 6 (six) hours as needed for fever or headache.      Alum & Mag Hydroxide-Simeth (GI COCKTAIL) SUSP suspension Take 30 mLs by mouth 4 (four) times daily -  before meals and at bedtime. Shake well. 120 mL 0   esomeprazole (NEXIUM) 40 MG capsule Take 1 capsule by mouth daily.     Probiotic Product (PROBIOTIC 10 ULTRA STRENGTH) CAPS Take 1 capsule by mouth in the morning and at bedtime.     sucralfate (CARAFATE) 1 g tablet Take 1 tablet (1 g total) by mouth 4 (four) times daily. 120 tablet 0   etonogestrel (NEXPLANON) 68 MG IMPL implant 1 each (68 mg total) by Subdermal route once for 1 dose. 1 each 0   No current facility-administered medications for this visit.    OBJECTIVE: Vitals:   12/27/22 1042  BP: (!) 134/100  Pulse: 69  Resp: 18  Temp: 97.9 F (36.6 C)  SpO2: 100%     Body mass index is 40.39 kg/m.    ECOG FS:0 - Asymptomatic  General: Well-developed, well-nourished, no acute distress. Eyes: Pink conjunctiva, anicteric sclera. HEENT: Normocephalic, moist mucous membranes. Lungs: No audible wheezing or coughing. Heart: Regular rate and rhythm. Abdomen: Soft, nontender, no obvious distention. Musculoskeletal: No edema, cyanosis, or clubbing. Neuro: Alert, answering all questions appropriately. Cranial nerves grossly intact. Skin: No rashes or petechiae noted. Psych: Normal affect. Lymphatics: No cervical, calvicular, axillary or inguinal LAD.   LAB RESULTS:  Lab Results  Component Value Date   NA 140 12/29/2020   K 3.1 (L) 12/29/2020   CL 105  12/29/2020   CO2 24 12/29/2020   GLUCOSE 121 (H) 12/29/2020   BUN 8 12/29/2020   CREATININE 0.57 12/29/2020   CALCIUM 8.6 (L) 12/29/2020   PROT 7.2 12/29/2020   ALBUMIN 3.8 12/29/2020   AST 17 12/29/2020   ALT 17 12/29/2020   ALKPHOS 57 12/29/2020   BILITOT 0.8 12/29/2020   GFRNONAA >60 12/29/2020   GFRAA >60 08/15/2020    Lab Results  Component Value Date   WBC 10.2 12/27/2022   NEUTROABS 8.6 (H) 07/28/2020   HGB 10.5 (L) 12/27/2022   HCT 34.5 (L) 12/27/2022   MCV 75.5 (L) 12/27/2022   PLT 406 (H) 12/27/2022     STUDIES: No results found.  ASSESSMENT: Iron deficiency anemia.  PLAN:    Iron deficiency anemia: Secondary to gastritis, dietary, and ongoing menses.  Patient's hemoglobin is decreased at 10.5 today, her iron stores are reduced, and she is symptomatic.  She cannot tolerate oral iron supplementation.  Patient last received IV iron in August 2021.  Return to clinic 5 times over the next 3 weeks to receive 200 mg of IV Venofer.  Patient will then return to clinic in 4 months with repeat laboratory work, further evaluation, and continuation of treatment if needed. B12 deficiency: Patient reports a history of B12 deficiency.  B12 levels are pending at time of dictation.  Will add in intramuscular injections if needed.  I spent a total of 45 minutes reviewing chart data, face-to-face evaluation with the patient, counseling and coordination of care as detailed above.   Patient expressed understanding and was in agreement with this plan. She also understands that She can call clinic at any time with any questions, concerns, or complaints.    Lloyd Huger, MD   12/27/2022 11:45 AM

## 2022-12-27 NOTE — Addendum Note (Signed)
Addended by: Philipp Deputy on: 12/27/2022 11:50 AM   Modules accepted: Orders

## 2023-01-08 ENCOUNTER — Inpatient Hospital Stay: Payer: BC Managed Care – PPO

## 2023-01-08 VITALS — BP 136/77 | HR 79 | Temp 98.2°F | Resp 18

## 2023-01-08 DIAGNOSIS — D509 Iron deficiency anemia, unspecified: Secondary | ICD-10-CM | POA: Diagnosis not present

## 2023-01-08 MED ORDER — SODIUM CHLORIDE 0.9 % IV SOLN
200.0000 mg | Freq: Once | INTRAVENOUS | Status: AC
  Administered 2023-01-08: 200 mg via INTRAVENOUS
  Filled 2023-01-08: qty 200

## 2023-01-08 MED ORDER — SODIUM CHLORIDE 0.9 % IV SOLN
Freq: Once | INTRAVENOUS | Status: AC
  Filled 2023-01-08: qty 250

## 2023-01-08 NOTE — Patient Instructions (Signed)
Iron Sucrose Injection What is this medication? IRON SUCROSE (EYE ern SOO krose) treats low levels of iron (iron deficiency anemia) in people with kidney disease. Iron is a mineral that plays an important role in making red blood cells, which carry oxygen from your lungs to the rest of your body. This medicine may be used for other purposes; ask your health care provider or pharmacist if you have questions. COMMON BRAND NAME(S): Venofer What should I tell my care team before I take this medication? They need to know if you have any of these conditions: Anemia not caused by low iron levels Heart disease High levels of iron in the blood Kidney disease Liver disease An unusual or allergic reaction to iron, other medications, foods, dyes, or preservatives Pregnant or trying to get pregnant Breastfeeding How should I use this medication? This medication is for infusion into a vein. It is given in a hospital or clinic setting. Talk to your care team about the use of this medication in children. While this medication may be prescribed for children as young as 2 years for selected conditions, precautions do apply. Overdosage: If you think you have taken too much of this medicine contact a poison control center or emergency room at once. NOTE: This medicine is only for you. Do not share this medicine with others. What if I miss a dose? Keep appointments for follow-up doses. It is important not to miss your dose. Call your care team if you are unable to keep an appointment. What may interact with this medication? Do not take this medication with any of the following: Deferoxamine Dimercaprol Other iron products This medication may also interact with the following: Chloramphenicol Deferasirox This list may not describe all possible interactions. Give your health care provider a list of all the medicines, herbs, non-prescription drugs, or dietary supplements you use. Also tell them if you smoke,  drink alcohol, or use illegal drugs. Some items may interact with your medicine. What should I watch for while using this medication? Visit your care team regularly. Tell your care team if your symptoms do not start to get better or if they get worse. You may need blood work done while you are taking this medication. You may need to follow a special diet. Talk to your care team. Foods that contain iron include: whole grains/cereals, dried fruits, beans, or peas, leafy green vegetables, and organ meats (liver, kidney). What side effects may I notice from receiving this medication? Side effects that you should report to your care team as soon as possible: Allergic reactions--skin rash, itching, hives, swelling of the face, lips, tongue, or throat Low blood pressure--dizziness, feeling faint or lightheaded, blurry vision Shortness of breath Side effects that usually do not require medical attention (report to your care team if they continue or are bothersome): Flushing Headache Joint pain Muscle pain Nausea Pain, redness, or irritation at injection site This list may not describe all possible side effects. Call your doctor for medical advice about side effects. You may report side effects to FDA at 1-800-FDA-1088. Where should I keep my medication? This medication is given in a hospital or clinic and will not be stored at home. NOTE: This sheet is a summary. It may not cover all possible information. If you have questions about this medicine, talk to your doctor, pharmacist, or health care provider.  2023 Elsevier/Gold Standard (2021-03-24 00:00:00)

## 2023-01-11 MED FILL — Iron Sucrose Inj 20 MG/ML (Fe Equiv): INTRAVENOUS | Qty: 10 | Status: AC

## 2023-01-12 ENCOUNTER — Inpatient Hospital Stay: Payer: BC Managed Care – PPO

## 2023-01-12 VITALS — BP 107/68 | HR 85 | Temp 97.9°F | Resp 16

## 2023-01-12 DIAGNOSIS — D509 Iron deficiency anemia, unspecified: Secondary | ICD-10-CM

## 2023-01-12 MED ORDER — SODIUM CHLORIDE 0.9 % IV SOLN
200.0000 mg | Freq: Once | INTRAVENOUS | Status: AC
  Administered 2023-01-12: 200 mg via INTRAVENOUS
  Filled 2023-01-12: qty 10

## 2023-01-12 MED ORDER — SODIUM CHLORIDE 0.9 % IV SOLN
INTRAVENOUS | Status: DC
  Filled 2023-01-12: qty 250

## 2023-01-12 NOTE — Patient Instructions (Signed)
Iron Sucrose Injection What is this medication? IRON SUCROSE (EYE ern SOO krose) treats low levels of iron (iron deficiency anemia) in people with kidney disease. Iron is a mineral that plays an important role in making red blood cells, which carry oxygen from your lungs to the rest of your body. This medicine may be used for other purposes; ask your health care provider or pharmacist if you have questions. COMMON BRAND NAME(S): Venofer What should I tell my care team before I take this medication? They need to know if you have any of these conditions: Anemia not caused by low iron levels Heart disease High levels of iron in the blood Kidney disease Liver disease An unusual or allergic reaction to iron, other medications, foods, dyes, or preservatives Pregnant or trying to get pregnant Breastfeeding How should I use this medication? This medication is for infusion into a vein. It is given in a hospital or clinic setting. Talk to your care team about the use of this medication in children. While this medication may be prescribed for children as young as 2 years for selected conditions, precautions do apply. Overdosage: If you think you have taken too much of this medicine contact a poison control center or emergency room at once. NOTE: This medicine is only for you. Do not share this medicine with others. What if I miss a dose? Keep appointments for follow-up doses. It is important not to miss your dose. Call your care team if you are unable to keep an appointment. What may interact with this medication? Do not take this medication with any of the following: Deferoxamine Dimercaprol Other iron products This medication may also interact with the following: Chloramphenicol Deferasirox This list may not describe all possible interactions. Give your health care provider a list of all the medicines, herbs, non-prescription drugs, or dietary supplements you use. Also tell them if you smoke,  drink alcohol, or use illegal drugs. Some items may interact with your medicine. What should I watch for while using this medication? Visit your care team regularly. Tell your care team if your symptoms do not start to get better or if they get worse. You may need blood work done while you are taking this medication. You may need to follow a special diet. Talk to your care team. Foods that contain iron include: whole grains/cereals, dried fruits, beans, or peas, leafy green vegetables, and organ meats (liver, kidney). What side effects may I notice from receiving this medication? Side effects that you should report to your care team as soon as possible: Allergic reactions--skin rash, itching, hives, swelling of the face, lips, tongue, or throat Low blood pressure--dizziness, feeling faint or lightheaded, blurry vision Shortness of breath Side effects that usually do not require medical attention (report to your care team if they continue or are bothersome): Flushing Headache Joint pain Muscle pain Nausea Pain, redness, or irritation at injection site This list may not describe all possible side effects. Call your doctor for medical advice about side effects. You may report side effects to FDA at 1-800-FDA-1088. Where should I keep my medication? This medication is given in a hospital or clinic and will not be stored at home. NOTE: This sheet is a summary. It may not cover all possible information. If you have questions about this medicine, talk to your doctor, pharmacist, or health care provider.  2023 Elsevier/Gold Standard (2021-03-24 00:00:00)

## 2023-01-15 ENCOUNTER — Inpatient Hospital Stay: Payer: BC Managed Care – PPO

## 2023-01-15 VITALS — BP 115/78 | HR 72 | Temp 98.0°F | Resp 16

## 2023-01-15 DIAGNOSIS — D509 Iron deficiency anemia, unspecified: Secondary | ICD-10-CM

## 2023-01-15 MED ORDER — SODIUM CHLORIDE 0.9 % IV SOLN
INTRAVENOUS | Status: DC
  Filled 2023-01-15 (×2): qty 250

## 2023-01-15 MED ORDER — SODIUM CHLORIDE 0.9 % IV SOLN
200.0000 mg | Freq: Once | INTRAVENOUS | Status: AC
  Administered 2023-01-15: 200 mg via INTRAVENOUS
  Filled 2023-01-15: qty 200

## 2023-01-15 NOTE — Patient Instructions (Signed)
Iron Sucrose Injection What is this medication? IRON SUCROSE (EYE ern SOO krose) treats low levels of iron (iron deficiency anemia) in people with kidney disease. Iron is a mineral that plays an important role in making red blood cells, which carry oxygen from your lungs to the rest of your body. This medicine may be used for other purposes; ask your health care provider or pharmacist if you have questions. COMMON BRAND NAME(S): Venofer What should I tell my care team before I take this medication? They need to know if you have any of these conditions: Anemia not caused by low iron levels Heart disease High levels of iron in the blood Kidney disease Liver disease An unusual or allergic reaction to iron, other medications, foods, dyes, or preservatives Pregnant or trying to get pregnant Breastfeeding How should I use this medication? This medication is for infusion into a vein. It is given in a hospital or clinic setting. Talk to your care team about the use of this medication in children. While this medication may be prescribed for children as young as 2 years for selected conditions, precautions do apply. Overdosage: If you think you have taken too much of this medicine contact a poison control center or emergency room at once. NOTE: This medicine is only for you. Do not share this medicine with others. What if I miss a dose? Keep appointments for follow-up doses. It is important not to miss your dose. Call your care team if you are unable to keep an appointment. What may interact with this medication? Do not take this medication with any of the following: Deferoxamine Dimercaprol Other iron products This medication may also interact with the following: Chloramphenicol Deferasirox This list may not describe all possible interactions. Give your health care provider a list of all the medicines, herbs, non-prescription drugs, or dietary supplements you use. Also tell them if you smoke,  drink alcohol, or use illegal drugs. Some items may interact with your medicine. What should I watch for while using this medication? Visit your care team regularly. Tell your care team if your symptoms do not start to get better or if they get worse. You may need blood work done while you are taking this medication. You may need to follow a special diet. Talk to your care team. Foods that contain iron include: whole grains/cereals, dried fruits, beans, or peas, leafy green vegetables, and organ meats (liver, kidney). What side effects may I notice from receiving this medication? Side effects that you should report to your care team as soon as possible: Allergic reactions--skin rash, itching, hives, swelling of the face, lips, tongue, or throat Low blood pressure--dizziness, feeling faint or lightheaded, blurry vision Shortness of breath Side effects that usually do not require medical attention (report to your care team if they continue or are bothersome): Flushing Headache Joint pain Muscle pain Nausea Pain, redness, or irritation at injection site This list may not describe all possible side effects. Call your doctor for medical advice about side effects. You may report side effects to FDA at 1-800-FDA-1088. Where should I keep my medication? This medication is given in a hospital or clinic and will not be stored at home. NOTE: This sheet is a summary. It may not cover all possible information. If you have questions about this medicine, talk to your doctor, pharmacist, or health care provider.  2023 Elsevier/Gold Standard (2021-03-24 00:00:00)

## 2023-01-19 ENCOUNTER — Inpatient Hospital Stay: Payer: BC Managed Care – PPO

## 2023-01-19 VITALS — BP 113/82 | HR 72 | Temp 97.9°F | Resp 16

## 2023-01-19 DIAGNOSIS — D509 Iron deficiency anemia, unspecified: Secondary | ICD-10-CM

## 2023-01-19 MED ORDER — SODIUM CHLORIDE 0.9 % IV SOLN
200.0000 mg | Freq: Once | INTRAVENOUS | Status: AC
  Administered 2023-01-19: 200 mg via INTRAVENOUS
  Filled 2023-01-19: qty 10

## 2023-01-19 MED ORDER — SODIUM CHLORIDE 0.9 % IV SOLN
Freq: Once | INTRAVENOUS | Status: AC
  Filled 2023-01-19: qty 250

## 2023-01-22 ENCOUNTER — Inpatient Hospital Stay: Payer: BC Managed Care – PPO

## 2023-01-22 VITALS — BP 149/98 | HR 79 | Temp 96.8°F | Resp 18

## 2023-01-22 DIAGNOSIS — D509 Iron deficiency anemia, unspecified: Secondary | ICD-10-CM | POA: Diagnosis not present

## 2023-01-22 MED ORDER — SODIUM CHLORIDE 0.9 % IV SOLN
200.0000 mg | Freq: Once | INTRAVENOUS | Status: AC
  Administered 2023-01-22: 200 mg via INTRAVENOUS
  Filled 2023-01-22: qty 200

## 2023-01-22 MED ORDER — SODIUM CHLORIDE 0.9 % IV SOLN
INTRAVENOUS | Status: DC
  Filled 2023-01-22: qty 250

## 2023-01-22 NOTE — Patient Instructions (Signed)
Iron Sucrose Injection What is this medication? IRON SUCROSE (EYE ern SOO krose) treats low levels of iron (iron deficiency anemia) in people with kidney disease. Iron is a mineral that plays an important role in making red blood cells, which carry oxygen from your lungs to the rest of your body. This medicine may be used for other purposes; ask your health care provider or pharmacist if you have questions. COMMON BRAND NAME(S): Venofer What should I tell my care team before I take this medication? They need to know if you have any of these conditions: Anemia not caused by low iron levels Heart disease High levels of iron in the blood Kidney disease Liver disease An unusual or allergic reaction to iron, other medications, foods, dyes, or preservatives Pregnant or trying to get pregnant Breastfeeding How should I use this medication? This medication is for infusion into a vein. It is given in a hospital or clinic setting. Talk to your care team about the use of this medication in children. While this medication may be prescribed for children as young as 2 years for selected conditions, precautions do apply. Overdosage: If you think you have taken too much of this medicine contact a poison control center or emergency room at once. NOTE: This medicine is only for you. Do not share this medicine with others. What if I miss a dose? Keep appointments for follow-up doses. It is important not to miss your dose. Call your care team if you are unable to keep an appointment. What may interact with this medication? Do not take this medication with any of the following: Deferoxamine Dimercaprol Other iron products This medication may also interact with the following: Chloramphenicol Deferasirox This list may not describe all possible interactions. Give your health care provider a list of all the medicines, herbs, non-prescription drugs, or dietary supplements you use. Also tell them if you smoke,  drink alcohol, or use illegal drugs. Some items may interact with your medicine. What should I watch for while using this medication? Visit your care team regularly. Tell your care team if your symptoms do not start to get better or if they get worse. You may need blood work done while you are taking this medication. You may need to follow a special diet. Talk to your care team. Foods that contain iron include: whole grains/cereals, dried fruits, beans, or peas, leafy green vegetables, and organ meats (liver, kidney). What side effects may I notice from receiving this medication? Side effects that you should report to your care team as soon as possible: Allergic reactions--skin rash, itching, hives, swelling of the face, lips, tongue, or throat Low blood pressure--dizziness, feeling faint or lightheaded, blurry vision Shortness of breath Side effects that usually do not require medical attention (report to your care team if they continue or are bothersome): Flushing Headache Joint pain Muscle pain Nausea Pain, redness, or irritation at injection site This list may not describe all possible side effects. Call your doctor for medical advice about side effects. You may report side effects to FDA at 1-800-FDA-1088. Where should I keep my medication? This medication is given in a hospital or clinic and will not be stored at home. NOTE: This sheet is a summary. It may not cover all possible information. If you have questions about this medicine, talk to your doctor, pharmacist, or health care provider.  2023 Elsevier/Gold Standard (2021-03-24 00:00:00)

## 2023-01-29 ENCOUNTER — Other Ambulatory Visit: Payer: BC Managed Care – PPO

## 2023-01-30 ENCOUNTER — Ambulatory Visit: Payer: BC Managed Care – PPO

## 2023-01-30 ENCOUNTER — Ambulatory Visit: Payer: BC Managed Care – PPO | Admitting: Oncology

## 2023-04-18 ENCOUNTER — Other Ambulatory Visit: Payer: BC Managed Care – PPO

## 2023-04-19 ENCOUNTER — Ambulatory Visit: Payer: BC Managed Care – PPO | Admitting: Oncology

## 2023-04-19 ENCOUNTER — Ambulatory Visit: Payer: BC Managed Care – PPO

## 2023-05-18 MED FILL — Iron Sucrose Inj 20 MG/ML (Fe Equiv): INTRAVENOUS | Qty: 10 | Status: AC

## 2023-05-22 ENCOUNTER — Encounter: Payer: Self-pay | Admitting: Oncology

## 2023-05-22 ENCOUNTER — Inpatient Hospital Stay (HOSPITAL_BASED_OUTPATIENT_CLINIC_OR_DEPARTMENT_OTHER): Payer: BC Managed Care – PPO | Admitting: Oncology

## 2023-05-22 ENCOUNTER — Inpatient Hospital Stay: Payer: BC Managed Care – PPO | Attending: Oncology

## 2023-05-22 ENCOUNTER — Inpatient Hospital Stay: Payer: BC Managed Care – PPO

## 2023-05-22 VITALS — BP 125/86 | HR 73 | Temp 98.4°F | Resp 18 | Ht 63.0 in | Wt 242.0 lb

## 2023-05-22 DIAGNOSIS — N920 Excessive and frequent menstruation with regular cycle: Secondary | ICD-10-CM | POA: Insufficient documentation

## 2023-05-22 DIAGNOSIS — D509 Iron deficiency anemia, unspecified: Secondary | ICD-10-CM

## 2023-05-22 DIAGNOSIS — D508 Other iron deficiency anemias: Secondary | ICD-10-CM | POA: Diagnosis present

## 2023-05-22 DIAGNOSIS — K2970 Gastritis, unspecified, without bleeding: Secondary | ICD-10-CM | POA: Diagnosis not present

## 2023-05-22 DIAGNOSIS — E538 Deficiency of other specified B group vitamins: Secondary | ICD-10-CM | POA: Insufficient documentation

## 2023-05-22 LAB — CBC WITH DIFFERENTIAL/PLATELET
Abs Immature Granulocytes: 0.05 10*3/uL (ref 0.00–0.07)
Basophils Absolute: 0.1 10*3/uL (ref 0.0–0.1)
Basophils Relative: 1 %
Eosinophils Absolute: 0.2 10*3/uL (ref 0.0–0.5)
Eosinophils Relative: 2 %
HCT: 38.6 % (ref 36.0–46.0)
Hemoglobin: 12.7 g/dL (ref 12.0–15.0)
Immature Granulocytes: 1 %
Lymphocytes Relative: 23 %
Lymphs Abs: 2.4 10*3/uL (ref 0.7–4.0)
MCH: 28.9 pg (ref 26.0–34.0)
MCHC: 32.9 g/dL (ref 30.0–36.0)
MCV: 87.7 fL (ref 80.0–100.0)
Monocytes Absolute: 0.5 10*3/uL (ref 0.1–1.0)
Monocytes Relative: 5 %
Neutro Abs: 7.4 10*3/uL (ref 1.7–7.7)
Neutrophils Relative %: 68 %
Platelets: 329 10*3/uL (ref 150–400)
RBC: 4.4 MIL/uL (ref 3.87–5.11)
RDW: 12.9 % (ref 11.5–15.5)
WBC: 10.7 10*3/uL — ABNORMAL HIGH (ref 4.0–10.5)
nRBC: 0 % (ref 0.0–0.2)

## 2023-05-22 LAB — IRON AND TIBC
Iron: 46 ug/dL (ref 28–170)
Saturation Ratios: 11 % (ref 10.4–31.8)
TIBC: 424 ug/dL (ref 250–450)
UIBC: 378 ug/dL

## 2023-05-22 LAB — FERRITIN: Ferritin: 15 ng/mL (ref 11–307)

## 2023-05-22 NOTE — Progress Notes (Signed)
[facility]  Telephone:(336) [phone] Fax:(336) [phone]  ID: Kimberly Mcfarland OB: 1981/06/02  MR#: [id number]  NGE#:[id number]  Patient Care Team: Barbette Reichmann, MD as PCP - General (Internal Medicine) Randa Spike, DO as Consulting Physician (Family Medicine) Jeralyn Ruths, MD as Consulting Physician (Oncology)  CHIEF COMPLAINT: Iron deficiency anemia.  INTERVAL HISTORY: Patient returns to clinic today for repeat laboratory work, further evaluation, consideration of additional IV Venofer.  Since receiving treatment 3 to 4 months ago she feels significantly improved.  She does not complain of any weakness or fatigue today.  She has no neurologic complaints.  She denies any recent fevers or illnesses.  She has a good appetite and denies weight loss.  She has no chest pain, shortness of breath, cough, or hemoptysis.  She denies any nausea, vomiting, constipation, or diarrhea.  She has no melena or hematochezia.  She has no urinary complaints.  Patient offers no specific complaints today.  REVIEW OF SYSTEMS:   Review of Systems  Constitutional: Negative.  Negative for fever, malaise/fatigue and weight loss.  Respiratory: Negative.  Negative for cough, hemoptysis and shortness of breath.   Cardiovascular: Negative.  Negative for chest pain and leg swelling.  Gastrointestinal:  Negative for abdominal pain, blood in stool and melena.  Genitourinary: Negative.  Negative for hematuria.  Musculoskeletal: Negative.  Negative for back pain.  Skin: Negative.  Negative for rash.  Neurological: Negative.  Negative for dizziness, focal weakness and weakness.  Psychiatric/Behavioral: Negative.  The patient is not nervous/anxious.     As per HPI. Otherwise, a complete review of systems is negative.  PAST MEDICAL HISTORY: Past Medical History:  Diagnosis Date   Anemia    after pregnancy   Asthma    well controlled-has not used inhaler in years   Complication of  anesthesia    difficulty voiding postop   Family history of adverse reaction to anesthesia    mom woke up during surgery   Family history of hemophilia 05/04/2014   patient has never been tested   GERD (gastroesophageal reflux disease)    Heart murmur    as child   Hypothyroid    h/o outgrew   IDA (iron deficiency anemia) 04/27/2020   Morbid obesity (HCC)    Multiple large sebaceous cysts of scalp 05/02/2018   PCOS (polycystic ovarian syndrome)     PAST SURGICAL HISTORY: Past Surgical History:  Procedure Laterality Date   CHOLECYSTECTOMY N/A 03/08/2018   Procedure: LAPAROSCOPIC CHOLECYSTECTOMY;  Surgeon: Ancil Linsey, MD;  Location: [facility];  Service: General;  Laterality: N/A;   CYST EXCISION  2010   head cysts x 14.  high levels of keratin   LESION EXCISION N/A 02/16/2016   Procedure: EXCISION SCALP LESION,excision 2 large scalp cysrts with intermediate closure;  Surgeon: Kieth Brightly, MD;  Location: [facility];  Service: General;  Laterality: N/A;   LESION EXCISION N/A 05/13/2018   Procedure: EXCISION SCALP LESION;  Surgeon: Ancil Linsey, MD;  Location: [facility];  Service: General;  Laterality: N/A;   WISDOM TOOTH EXTRACTION      FAMILY HISTORY: Family History  Problem Relation Age of Onset   Diabetes Mother    Asthma Mother    Obesity Mother    Hypertension Mother    Hemophilia Mother    Heart disease Father    Heart attack Father    Testicular cancer Father    Breast cancer Other    Diabetes Maternal Grandmother  type 2   Factor VIII deficiency Other        Also has factor 9 deficiency   Factor VIII deficiency Other        also has factor 9 deficiency   Hemophilia Maternal Grandfather    Esophageal cancer Maternal Grandfather    Pancreatic cancer Paternal Grandmother     ADVANCED DIRECTIVES (Y/N):  N  HEALTH MAINTENANCE: Social History   Tobacco Use   Smoking status: Never   Smokeless tobacco: Never  Vaping Use   Vaping Use: Never  used  Substance Use Topics   Alcohol use: No    Alcohol/week: 0.0 standard drinks of alcohol   Drug use: No     Colonoscopy:  PAP:  Bone density:  Lipid panel:  Allergies  Allergen Reactions   Sulfa Antibiotics Other (See Comments)    seizures   Levaquin [Levofloxacin In D5w] Hives   Other     dermabond   Penicillins Hives   Pepcid [Famotidine] Other (See Comments)    Dizziness   Oxycodone Itching and Rash    Current Outpatient Medications  Medication Sig Dispense Refill   acetaminophen (TYLENOL) 500 MG tablet Take 1,000 mg by mouth every 6 (six) hours as needed for fever or headache.      Alum & Mag Hydroxide-Simeth (GI COCKTAIL) SUSP suspension Take 30 mLs by mouth 4 (four) times daily -  before meals and at bedtime. Shake well. 120 mL 0   esomeprazole (NEXIUM) 40 MG capsule Take 1 capsule by mouth daily.     etonogestrel (NEXPLANON) 68 MG IMPL implant 1 each (68 mg total) by Subdermal route once for 1 dose. 1 each 0   Probiotic Product (PROBIOTIC 10 ULTRA STRENGTH) CAPS Take 1 capsule by mouth in the morning and at bedtime.     sucralfate (CARAFATE) 1 g tablet Take 1 tablet (1 g total) by mouth 4 (four) times daily. 120 tablet 0   No current facility-administered medications for this visit.    OBJECTIVE: Vitals:   05/22/23 1359  BP: 125/86  Pulse: 73  Resp: 18  Temp: 98.4 F (36.9 C)  SpO2: 100%     Body mass index is 42.87 kg/m.    ECOG FS:0 - Asymptomatic  General: Well-developed, well-nourished, no acute distress. Eyes: Pink conjunctiva, anicteric sclera. HEENT: Normocephalic, moist mucous membranes. Lungs: No audible wheezing or coughing. Heart: Regular rate and rhythm. Abdomen: Soft, nontender, no obvious distention. Musculoskeletal: No edema, cyanosis, or clubbing. Neuro: Alert, answering all questions appropriately. Cranial nerves grossly intact. Skin: No rashes or petechiae noted. Psych: Normal affect.  LAB RESULTS:  Lab Results  Component  Value Date   NA 140 12/29/2020   K 3.1 (L) 12/29/2020   CL 105 12/29/2020   CO2 24 12/29/2020   GLUCOSE 121 (H) 12/29/2020   BUN 8 12/29/2020   CREATININE 0.57 12/29/2020   CALCIUM 8.6 (L) 12/29/2020   PROT 7.2 12/29/2020   ALBUMIN 3.8 12/29/2020   AST 17 12/29/2020   ALT 17 12/29/2020   ALKPHOS 57 12/29/2020   BILITOT 0.8 12/29/2020   GFRNONAA >60 12/29/2020   GFRAA >60 08/15/2020    Lab Results  Component Value Date   WBC 10.7 (H) 05/22/2023   NEUTROABS 7.4 05/22/2023   HGB 12.7 05/22/2023   HCT 38.6 05/22/2023   MCV 87.7 05/22/2023   PLT 329 05/22/2023   Lab Results  Component Value Date   IRON 30 12/27/2022   TIBC 521 (H) 12/27/2022   IRONPCTSAT  6 (L) 12/27/2022   Lab Results  Component Value Date   FERRITIN 4 (L) 12/27/2022     STUDIES: No results found.  ASSESSMENT: Iron deficiency anemia.  PLAN:    Iron deficiency anemia: Secondary to gastritis, dietary, and ongoing menses. She cannot tolerate oral iron supplementation.  Patient's hemoglobin has significantly improved and is now within normal limits at 12.7.  Iron stores from today are pending at time of dictation.  She does not require additional treatment today.  Patient last received IV Venofer on January 22, 2023.  Return to clinic in 4 months with repeat laboratory work, further evaluation, and continuation of treatment if needed.   B12 deficiency: Patient's most recent B12 levels were within normal limits.  I spent a total of 20 minutes reviewing chart data, face-to-face evaluation with the patient, counseling and coordination of care as detailed above.    Patient expressed understanding and was in agreement with this plan. She also understands that She can call clinic at any time with any questions, concerns, or complaints.    Jeralyn Ruths, MD   05/22/2023 2:21 PM

## 2023-06-26 ENCOUNTER — Other Ambulatory Visit: Payer: Self-pay | Admitting: Internal Medicine

## 2023-06-26 DIAGNOSIS — Z1231 Encounter for screening mammogram for malignant neoplasm of breast: Secondary | ICD-10-CM

## 2023-09-24 ENCOUNTER — Inpatient Hospital Stay: Payer: BC Managed Care – PPO | Attending: Oncology

## 2023-09-24 ENCOUNTER — Other Ambulatory Visit: Payer: Self-pay

## 2023-09-24 DIAGNOSIS — D509 Iron deficiency anemia, unspecified: Secondary | ICD-10-CM | POA: Insufficient documentation

## 2023-09-24 LAB — CBC WITH DIFFERENTIAL/PLATELET
Abs Immature Granulocytes: 0.06 10*3/uL (ref 0.00–0.07)
Basophils Absolute: 0.1 10*3/uL (ref 0.0–0.1)
Basophils Relative: 1 %
Eosinophils Absolute: 0.2 10*3/uL (ref 0.0–0.5)
Eosinophils Relative: 2 %
HCT: 37.8 % (ref 36.0–46.0)
Hemoglobin: 12.2 g/dL (ref 12.0–15.0)
Immature Granulocytes: 0 %
Lymphocytes Relative: 15 %
Lymphs Abs: 2.2 10*3/uL (ref 0.7–4.0)
MCH: 28.2 pg (ref 26.0–34.0)
MCHC: 32.3 g/dL (ref 30.0–36.0)
MCV: 87.3 fL (ref 80.0–100.0)
Monocytes Absolute: 0.7 10*3/uL (ref 0.1–1.0)
Monocytes Relative: 5 %
Neutro Abs: 11 10*3/uL — ABNORMAL HIGH (ref 1.7–7.7)
Neutrophils Relative %: 77 %
Platelets: 343 10*3/uL (ref 150–400)
RBC: 4.33 MIL/uL (ref 3.87–5.11)
RDW: 12.9 % (ref 11.5–15.5)
WBC: 14.2 10*3/uL — ABNORMAL HIGH (ref 4.0–10.5)
nRBC: 0 % (ref 0.0–0.2)

## 2023-09-24 LAB — IRON AND TIBC
Iron: 45 ug/dL (ref 28–170)
Saturation Ratios: 10 % — ABNORMAL LOW (ref 10.4–31.8)
TIBC: 437 ug/dL (ref 250–450)
UIBC: 392 ug/dL

## 2023-09-24 LAB — FERRITIN: Ferritin: 7 ng/mL — ABNORMAL LOW (ref 11–307)

## 2023-09-25 ENCOUNTER — Inpatient Hospital Stay: Payer: BC Managed Care – PPO

## 2023-09-25 ENCOUNTER — Inpatient Hospital Stay: Payer: BC Managed Care – PPO | Attending: Oncology | Admitting: Oncology

## 2023-09-25 ENCOUNTER — Encounter: Payer: Self-pay | Admitting: Oncology

## 2023-09-25 VITALS — BP 125/88 | HR 84 | Temp 98.9°F | Resp 18 | Ht 63.0 in | Wt 259.0 lb

## 2023-09-25 VITALS — BP 117/86 | HR 68 | Temp 96.4°F | Resp 18

## 2023-09-25 DIAGNOSIS — E282 Polycystic ovarian syndrome: Secondary | ICD-10-CM | POA: Insufficient documentation

## 2023-09-25 DIAGNOSIS — D509 Iron deficiency anemia, unspecified: Secondary | ICD-10-CM

## 2023-09-25 DIAGNOSIS — K219 Gastro-esophageal reflux disease without esophagitis: Secondary | ICD-10-CM | POA: Insufficient documentation

## 2023-09-25 DIAGNOSIS — Z79899 Other long term (current) drug therapy: Secondary | ICD-10-CM | POA: Diagnosis not present

## 2023-09-25 DIAGNOSIS — E538 Deficiency of other specified B group vitamins: Secondary | ICD-10-CM | POA: Insufficient documentation

## 2023-09-25 DIAGNOSIS — Z803 Family history of malignant neoplasm of breast: Secondary | ICD-10-CM | POA: Insufficient documentation

## 2023-09-25 DIAGNOSIS — R5383 Other fatigue: Secondary | ICD-10-CM | POA: Diagnosis not present

## 2023-09-25 DIAGNOSIS — J45909 Unspecified asthma, uncomplicated: Secondary | ICD-10-CM | POA: Insufficient documentation

## 2023-09-25 MED ORDER — SODIUM CHLORIDE 0.9 % IV SOLN
200.0000 mg | Freq: Once | INTRAVENOUS | Status: AC
  Administered 2023-09-25: 200 mg via INTRAVENOUS
  Filled 2023-09-25: qty 200
  Filled 2023-09-25: qty 10

## 2023-09-25 MED ORDER — SODIUM CHLORIDE 0.9 % IV SOLN
INTRAVENOUS | Status: DC
  Filled 2023-09-25: qty 250

## 2023-09-25 NOTE — Progress Notes (Signed)
[facility]  Telephone:(336) [phone] Fax:(336) [phone]  ID: Kimberly Mcfarland OB: 1981/03/27  MR#: [id number]  AOZ#:[id number]  Patient Care Team: Barbette Reichmann, MD as PCP - General (Internal Medicine) Randa Spike, DO as Consulting Physician (Family Medicine) Jeralyn Ruths, MD as Consulting Physician (Oncology)  CHIEF COMPLAINT: Iron deficiency anemia.  INTERVAL HISTORY: Patient returns to clinic today for repeat laboratory work, further evaluation, consideration of additional IV Venofer.  She has mild fatigue, but otherwise feels well. She has no neurologic complaints.  She denies any recent fevers or illnesses.  She has a good appetite and denies weight loss.  She has no chest pain, shortness of breath, cough, or hemoptysis.  She denies any nausea, vomiting, constipation, or diarrhea.  She has no melena or hematochezia.  She has no urinary complaints.  Patient offers no further specific complaints today.  REVIEW OF SYSTEMS:   Review of Systems  Constitutional:  Positive for malaise/fatigue. Negative for fever and weight loss.  Respiratory: Negative.  Negative for cough, hemoptysis and shortness of breath.   Cardiovascular: Negative.  Negative for chest pain and leg swelling.  Gastrointestinal:  Negative for abdominal pain, blood in stool and melena.  Genitourinary: Negative.  Negative for hematuria.  Musculoskeletal: Negative.  Negative for back pain.  Skin: Negative.  Negative for rash.  Neurological: Negative.  Negative for dizziness, focal weakness and weakness.  Psychiatric/Behavioral: Negative.  The patient is not nervous/anxious.     As per HPI. Otherwise, a complete review of systems is negative.  PAST MEDICAL HISTORY: Past Medical History:  Diagnosis Date   Anemia    after pregnancy   Asthma    well controlled-has not used inhaler in years   Complication of anesthesia    difficulty voiding postop   Family history of adverse reaction  to anesthesia    mom woke up during surgery   Family history of hemophilia 05/04/2014   patient has never been tested   GERD (gastroesophageal reflux disease)    Heart murmur    as child   Hypothyroid    h/o outgrew   IDA (iron deficiency anemia) 04/27/2020   Morbid obesity (HCC)    Multiple large sebaceous cysts of scalp 05/02/2018   PCOS (polycystic ovarian syndrome)     PAST SURGICAL HISTORY: Past Surgical History:  Procedure Laterality Date   CHOLECYSTECTOMY N/A 03/08/2018   Procedure: LAPAROSCOPIC CHOLECYSTECTOMY;  Surgeon: Ancil Linsey, MD;  Location: [facility];  Service: General;  Laterality: N/A;   CYST EXCISION  2010   head cysts x 14.  high levels of keratin   LESION EXCISION N/A 02/16/2016   Procedure: EXCISION SCALP LESION,excision 2 large scalp cysrts with intermediate closure;  Surgeon: Kieth Brightly, MD;  Location: [facility];  Service: General;  Laterality: N/A;   LESION EXCISION N/A 05/13/2018   Procedure: EXCISION SCALP LESION;  Surgeon: Ancil Linsey, MD;  Location: [facility];  Service: General;  Laterality: N/A;   WISDOM TOOTH EXTRACTION      FAMILY HISTORY: Family History  Problem Relation Age of Onset   Diabetes Mother    Asthma Mother    Obesity Mother    Hypertension Mother    Hemophilia Mother    Heart disease Father    Heart attack Father    Testicular cancer Father    Breast cancer Other    Diabetes Maternal Grandmother        type 2   Factor VIII deficiency Other  Also has factor 9 deficiency   Factor VIII deficiency Other        also has factor 9 deficiency   Hemophilia Maternal Grandfather    Esophageal cancer Maternal Grandfather    Pancreatic cancer Paternal Grandmother     ADVANCED DIRECTIVES (Y/N):  N  HEALTH MAINTENANCE: Social History   Tobacco Use   Smoking status: Never   Smokeless tobacco: Never  Vaping Use   Vaping status: Never Used  Substance Use Topics   Alcohol use: No    Alcohol/week: 0.0 standard  drinks of alcohol   Drug use: No     Colonoscopy:  PAP:  Bone density:  Lipid panel:  Allergies  Allergen Reactions   Sulfa Antibiotics Other (See Comments)    seizures   Levaquin [Levofloxacin In D5w] Hives   Other     dermabond   Penicillins Hives   Pepcid [Famotidine] Other (See Comments)    Dizziness   Oxycodone Itching and Rash    Current Outpatient Medications  Medication Sig Dispense Refill   acetaminophen (TYLENOL) 500 MG tablet Take 1,000 mg by mouth every 6 (six) hours as needed for fever or headache.      Alum & Mag Hydroxide-Simeth (GI COCKTAIL) SUSP suspension Take 30 mLs by mouth 4 (four) times daily -  before meals and at bedtime. Shake well. 120 mL 0   esomeprazole (NEXIUM) 40 MG capsule Take 1 capsule by mouth daily.     etonogestrel (NEXPLANON) 68 MG IMPL implant 1 each (68 mg total) by Subdermal route once for 1 dose. 1 each 0   Probiotic Product (PROBIOTIC 10 ULTRA STRENGTH) CAPS Take 1 capsule by mouth in the morning and at bedtime.     sucralfate (CARAFATE) 1 g tablet Take 1 tablet (1 g total) by mouth 4 (four) times daily. 120 tablet 0   No current facility-administered medications for this visit.   Facility-Administered Medications Ordered in Other Visits  Medication Dose Route Frequency Provider Last Rate Last Admin   0.9 %  sodium chloride infusion   Intravenous Continuous Jeralyn Ruths, MD   Stopped at 09/25/23 1525    OBJECTIVE: Vitals:   09/25/23 1316  BP: 125/88  Pulse: 84  Resp: 18  Temp: 98.9 F (37.2 C)  SpO2: 100%     Body mass index is 45.88 kg/m.    ECOG FS:0 - Asymptomatic  General: Well-developed, well-nourished, no acute distress. Eyes: Pink conjunctiva, anicteric sclera. HEENT: Normocephalic, moist mucous membranes. Lungs: No audible wheezing or coughing. Heart: Regular rate and rhythm. Abdomen: Soft, nontender, no obvious distention. Musculoskeletal: No edema, cyanosis, or clubbing. Neuro: Alert, answering all  questions appropriately. Cranial nerves grossly intact. Skin: No rashes or petechiae noted. Psych: Normal affect.  LAB RESULTS:  Lab Results  Component Value Date   NA 140 12/29/2020   K 3.1 (L) 12/29/2020   CL 105 12/29/2020   CO2 24 12/29/2020   GLUCOSE 121 (H) 12/29/2020   BUN 8 12/29/2020   CREATININE 0.57 12/29/2020   CALCIUM 8.6 (L) 12/29/2020   PROT 7.2 12/29/2020   ALBUMIN 3.8 12/29/2020   AST 17 12/29/2020   ALT 17 12/29/2020   ALKPHOS 57 12/29/2020   BILITOT 0.8 12/29/2020   GFRNONAA >60 12/29/2020   GFRAA >60 08/15/2020    Lab Results  Component Value Date   WBC 14.2 (H) 09/24/2023   NEUTROABS 11.0 (H) 09/24/2023   HGB 12.2 09/24/2023   HCT 37.8 09/24/2023   MCV 87.3 09/24/2023  PLT 343 09/24/2023   Lab Results  Component Value Date   IRON 45 09/24/2023   TIBC 437 09/24/2023   IRONPCTSAT 10 (L) 09/24/2023   Lab Results  Component Value Date   FERRITIN 7 (L) 09/24/2023     STUDIES: No results found.  ASSESSMENT: Iron deficiency anemia.  PLAN:    Iron deficiency anemia: Secondary to gastritis, dietary, and ongoing menses. She cannot tolerate oral iron supplementation.  Patient's hemoglobin continues to be within normal limits at 12.2, but her iron saturation levels and ferritin levels have decreased and she is mildly symptomatic.  Proceed with 200 mg IV Venofer today.  Patient will return to clinic later this week and 1 time next week to receive additional treatment.  She will then return to clinic in 4 months with repeat laboratory work, further evaluation, and continuation of treatment if needed.   B12 deficiency: Patient's most recent B12 levels were within normal limits.  I spent a total of 30 minutes reviewing chart data, face-to-face evaluation with the patient, counseling and coordination of care as detailed above.   Patient expressed understanding and was in agreement with this plan. She also understands that She can call clinic at any  time with any questions, concerns, or complaints.    Jeralyn Ruths, MD   09/25/2023 4:16 PM

## 2023-09-27 ENCOUNTER — Inpatient Hospital Stay: Payer: BC Managed Care – PPO

## 2023-09-27 VITALS — BP 146/87 | HR 77 | Resp 18

## 2023-09-27 DIAGNOSIS — D509 Iron deficiency anemia, unspecified: Secondary | ICD-10-CM

## 2023-09-27 MED ORDER — SODIUM CHLORIDE 0.9 % IV SOLN
200.0000 mg | Freq: Once | INTRAVENOUS | Status: AC
  Administered 2023-09-27: 200 mg via INTRAVENOUS
  Filled 2023-09-27: qty 200

## 2023-09-27 MED ORDER — SODIUM CHLORIDE 0.9 % IV SOLN
Freq: Once | INTRAVENOUS | Status: AC
  Filled 2023-09-27: qty 250

## 2023-10-02 ENCOUNTER — Inpatient Hospital Stay: Payer: BC Managed Care – PPO

## 2023-10-02 VITALS — BP 127/81 | HR 89 | Temp 97.2°F | Resp 18

## 2023-10-02 DIAGNOSIS — D509 Iron deficiency anemia, unspecified: Secondary | ICD-10-CM | POA: Diagnosis not present

## 2023-10-02 MED ORDER — SODIUM CHLORIDE 0.9 % IV SOLN
200.0000 mg | Freq: Once | INTRAVENOUS | Status: AC
  Administered 2023-10-02: 200 mg via INTRAVENOUS
  Filled 2023-10-02: qty 200

## 2023-10-02 MED ORDER — SODIUM CHLORIDE 0.9 % IV SOLN
Freq: Once | INTRAVENOUS | Status: AC
  Filled 2023-10-02: qty 250

## 2023-12-24 ENCOUNTER — Encounter: Payer: Self-pay | Admitting: Oncology

## 2024-01-28 ENCOUNTER — Other Ambulatory Visit: Payer: BC Managed Care – PPO

## 2024-01-29 ENCOUNTER — Ambulatory Visit: Payer: BC Managed Care – PPO | Admitting: Oncology

## 2024-01-29 ENCOUNTER — Ambulatory Visit: Payer: BC Managed Care – PPO

## 2024-02-01 ENCOUNTER — Other Ambulatory Visit: Payer: Self-pay | Admitting: *Deleted

## 2024-02-01 DIAGNOSIS — D509 Iron deficiency anemia, unspecified: Secondary | ICD-10-CM

## 2024-02-04 ENCOUNTER — Inpatient Hospital Stay: Payer: 59 | Attending: Oncology

## 2024-02-04 ENCOUNTER — Encounter: Payer: Self-pay | Admitting: Oncology

## 2024-02-04 DIAGNOSIS — D72829 Elevated white blood cell count, unspecified: Secondary | ICD-10-CM | POA: Insufficient documentation

## 2024-02-04 DIAGNOSIS — E282 Polycystic ovarian syndrome: Secondary | ICD-10-CM | POA: Insufficient documentation

## 2024-02-04 DIAGNOSIS — R011 Cardiac murmur, unspecified: Secondary | ICD-10-CM | POA: Diagnosis not present

## 2024-02-04 DIAGNOSIS — Z803 Family history of malignant neoplasm of breast: Secondary | ICD-10-CM | POA: Diagnosis not present

## 2024-02-04 DIAGNOSIS — E538 Deficiency of other specified B group vitamins: Secondary | ICD-10-CM | POA: Insufficient documentation

## 2024-02-04 DIAGNOSIS — K219 Gastro-esophageal reflux disease without esophagitis: Secondary | ICD-10-CM | POA: Diagnosis not present

## 2024-02-04 DIAGNOSIS — Z79899 Other long term (current) drug therapy: Secondary | ICD-10-CM | POA: Insufficient documentation

## 2024-02-04 DIAGNOSIS — Z6841 Body Mass Index (BMI) 40.0 and over, adult: Secondary | ICD-10-CM | POA: Insufficient documentation

## 2024-02-04 DIAGNOSIS — E039 Hypothyroidism, unspecified: Secondary | ICD-10-CM | POA: Insufficient documentation

## 2024-02-04 DIAGNOSIS — J45909 Unspecified asthma, uncomplicated: Secondary | ICD-10-CM | POA: Insufficient documentation

## 2024-02-04 DIAGNOSIS — D509 Iron deficiency anemia, unspecified: Secondary | ICD-10-CM | POA: Diagnosis present

## 2024-02-04 LAB — CBC WITH DIFFERENTIAL/PLATELET
Abs Immature Granulocytes: 0.1 10*3/uL — ABNORMAL HIGH (ref 0.00–0.07)
Basophils Absolute: 0.1 10*3/uL (ref 0.0–0.1)
Basophils Relative: 1 %
Eosinophils Absolute: 0.3 10*3/uL (ref 0.0–0.5)
Eosinophils Relative: 2 %
HCT: 38 % (ref 36.0–46.0)
Hemoglobin: 12.9 g/dL (ref 12.0–15.0)
Immature Granulocytes: 1 %
Lymphocytes Relative: 16 %
Lymphs Abs: 2.4 10*3/uL (ref 0.7–4.0)
MCH: 29.7 pg (ref 26.0–34.0)
MCHC: 33.9 g/dL (ref 30.0–36.0)
MCV: 87.6 fL (ref 80.0–100.0)
Monocytes Absolute: 0.8 10*3/uL (ref 0.1–1.0)
Monocytes Relative: 5 %
Neutro Abs: 10.9 10*3/uL — ABNORMAL HIGH (ref 1.7–7.7)
Neutrophils Relative %: 75 %
Platelets: 339 10*3/uL (ref 150–400)
RBC: 4.34 MIL/uL (ref 3.87–5.11)
RDW: 13 % (ref 11.5–15.5)
WBC: 14.5 10*3/uL — ABNORMAL HIGH (ref 4.0–10.5)
nRBC: 0 % (ref 0.0–0.2)

## 2024-02-04 LAB — IRON AND TIBC
Iron: 53 ug/dL (ref 28–170)
Saturation Ratios: 13 % (ref 10.4–31.8)
TIBC: 407 ug/dL (ref 250–450)
UIBC: 354 ug/dL

## 2024-02-04 LAB — FERRITIN: Ferritin: 21 ng/mL (ref 11–307)

## 2024-02-05 ENCOUNTER — Encounter: Payer: Self-pay | Admitting: Oncology

## 2024-02-05 ENCOUNTER — Inpatient Hospital Stay: Payer: Self-pay

## 2024-02-05 ENCOUNTER — Inpatient Hospital Stay: Payer: 59 | Admitting: Oncology

## 2024-02-05 VITALS — BP 139/92 | HR 77 | Temp 97.7°F | Resp 18 | Wt 256.0 lb

## 2024-02-05 DIAGNOSIS — D509 Iron deficiency anemia, unspecified: Secondary | ICD-10-CM | POA: Diagnosis not present

## 2024-02-05 DIAGNOSIS — D72829 Elevated white blood cell count, unspecified: Secondary | ICD-10-CM | POA: Diagnosis not present

## 2024-02-05 NOTE — Progress Notes (Signed)
Patient here today for follow up regarding anemia.

## 2024-02-05 NOTE — Progress Notes (Signed)
[facility]  Telephone:(336) [phone] Fax:(336) [phone]  ID: Kimberly Mcfarland OB: 12/11/1981  MR#: [id number]  DGU#:[id number]  Patient Care Team: Barbette Reichmann, MD as PCP - General (Internal Medicine) Randa Spike, DO as Consulting Physician (Family Medicine) Jeralyn Ruths, MD as Consulting Physician (Oncology)  CHIEF COMPLAINT: Iron deficiency anemia.  INTERVAL HISTORY: Patient returns to clinic today for repeat laboratory work, further evaluation, and consideration of additional IV Venofer.  She currently feels well and is asymptomatic.  She does not complain of any weakness or fatigue.  She has no neurologic complaints.  She denies any recent fevers or illnesses.  She has a good appetite and denies weight loss.  She has no chest pain, shortness of breath, cough, or hemoptysis.  She denies any nausea, vomiting, constipation, or diarrhea.  She has no melena or hematochezia.  She has no urinary complaints.  Patient offers no specific complaints today.  REVIEW OF SYSTEMS:   Review of Systems  Constitutional: Negative.  Negative for fever, malaise/fatigue and weight loss.  Respiratory: Negative.  Negative for cough, hemoptysis and shortness of breath.   Cardiovascular: Negative.  Negative for chest pain and leg swelling.  Gastrointestinal:  Negative for abdominal pain, blood in stool and melena.  Genitourinary: Negative.  Negative for hematuria.  Musculoskeletal: Negative.  Negative for back pain.  Skin: Negative.  Negative for rash.  Neurological: Negative.  Negative for dizziness, focal weakness and weakness.  Psychiatric/Behavioral: Negative.  The patient is not nervous/anxious.     As per HPI. Otherwise, a complete review of systems is negative.  PAST MEDICAL HISTORY: Past Medical History:  Diagnosis Date   Anemia    after pregnancy   Asthma    well controlled-has not used inhaler in years   Complication of anesthesia    difficulty voiding  postop   Family history of adverse reaction to anesthesia    mom woke up during surgery   Family history of hemophilia 05/04/2014   patient has never been tested   GERD (gastroesophageal reflux disease)    Heart murmur    as child   Hypothyroid    h/o outgrew   IDA (iron deficiency anemia) 04/27/2020   Morbid obesity (HCC)    Multiple large sebaceous cysts of scalp 05/02/2018   PCOS (polycystic ovarian syndrome)     PAST SURGICAL HISTORY: Past Surgical History:  Procedure Laterality Date   CHOLECYSTECTOMY N/A 03/08/2018   Procedure: LAPAROSCOPIC CHOLECYSTECTOMY;  Surgeon: Ancil Linsey, MD;  Location: [facility];  Service: General;  Laterality: N/A;   CYST EXCISION  2010   head cysts x 14.  high levels of keratin   LESION EXCISION N/A 02/16/2016   Procedure: EXCISION SCALP LESION,excision 2 large scalp cysrts with intermediate closure;  Surgeon: Kieth Brightly, MD;  Location: [facility];  Service: General;  Laterality: N/A;   LESION EXCISION N/A 05/13/2018   Procedure: EXCISION SCALP LESION;  Surgeon: Ancil Linsey, MD;  Location: [facility];  Service: General;  Laterality: N/A;   WISDOM TOOTH EXTRACTION      FAMILY HISTORY: Family History  Problem Relation Age of Onset   Diabetes Mother    Asthma Mother    Obesity Mother    Hypertension Mother    Hemophilia Mother    Heart disease Father    Heart attack Father    Testicular cancer Father    Breast cancer Other    Diabetes Maternal Grandmother  type 2   Factor VIII deficiency Other        Also has factor 9 deficiency   Factor VIII deficiency Other        also has factor 9 deficiency   Hemophilia Maternal Grandfather    Esophageal cancer Maternal Grandfather    Pancreatic cancer Paternal Grandmother     ADVANCED DIRECTIVES (Y/N):  N  HEALTH MAINTENANCE: Social History   Tobacco Use   Smoking status: Never   Smokeless tobacco: Never  Vaping Use   Vaping status: Never Used  Substance Use Topics    Alcohol use: No    Alcohol/week: 0.0 standard drinks of alcohol   Drug use: No     Colonoscopy:  PAP:  Bone density:  Lipid panel:  Allergies  Allergen Reactions   Sulfa Antibiotics Other (See Comments)    seizures   Levaquin [Levofloxacin In D5w] Hives   Other     dermabond   Penicillins Hives   Pepcid [Famotidine] Other (See Comments)    Dizziness   Oxycodone Itching and Rash    Current Outpatient Medications  Medication Sig Dispense Refill   acetaminophen (TYLENOL) 500 MG tablet Take 1,000 mg by mouth every 6 (six) hours as needed for fever or headache.      esomeprazole (NEXIUM) 40 MG capsule Take 1 capsule by mouth daily.     Probiotic Product (PROBIOTIC 10 ULTRA STRENGTH) CAPS Take 1 capsule by mouth in the morning and at bedtime.     etonogestrel (NEXPLANON) 68 MG IMPL implant 1 each (68 mg total) by Subdermal route once for 1 dose. 1 each 0   sucralfate (CARAFATE) 1 g tablet Take 1 tablet (1 g total) by mouth 4 (four) times daily. 120 tablet 0   No current facility-administered medications for this visit.    OBJECTIVE: Vitals:   02/05/24 1407  BP: (!) 139/92  Pulse: 77  Resp: 18  Temp: 97.7 F (36.5 C)  SpO2: 99%     Body mass index is 45.35 kg/m.    ECOG FS:0 - Asymptomatic  General: Well-developed, well-nourished, no acute distress. Eyes: Pink conjunctiva, anicteric sclera. HEENT: Normocephalic, moist mucous membranes. Lungs: No audible wheezing or coughing. Heart: Regular rate and rhythm. Abdomen: Soft, nontender, no obvious distention. Musculoskeletal: No edema, cyanosis, or clubbing. Neuro: Alert, answering all questions appropriately. Cranial nerves grossly intact. Skin: No rashes or petechiae noted. Psych: Normal affect.  LAB RESULTS:  Lab Results  Component Value Date   NA 140 12/29/2020   K 3.1 (L) 12/29/2020   CL 105 12/29/2020   CO2 24 12/29/2020   GLUCOSE 121 (H) 12/29/2020   BUN 8 12/29/2020   CREATININE 0.57 12/29/2020    CALCIUM 8.6 (L) 12/29/2020   PROT 7.2 12/29/2020   ALBUMIN 3.8 12/29/2020   AST 17 12/29/2020   ALT 17 12/29/2020   ALKPHOS 57 12/29/2020   BILITOT 0.8 12/29/2020   GFRNONAA >60 12/29/2020   GFRAA >60 08/15/2020    Lab Results  Component Value Date   WBC 14.5 (H) 02/04/2024   NEUTROABS 10.9 (H) 02/04/2024   HGB 12.9 02/04/2024   HCT 38.0 02/04/2024   MCV 87.6 02/04/2024   PLT 339 02/04/2024   Lab Results  Component Value Date   IRON 53 02/04/2024   TIBC 407 02/04/2024   IRONPCTSAT 13 02/04/2024   Lab Results  Component Value Date   FERRITIN 21 02/04/2024     STUDIES: No results found.  ASSESSMENT: Iron deficiency anemia.  PLAN:  Iron deficiency anemia: Resolved.  Possibly secondary to history of gastritis and heavy menses. She cannot tolerate oral iron supplementation.  Patient's hemoglobin and iron stores are now within normal limits.  She does not require additional IV Venofer today.  Patient last received treatment on October 02, 2023.  No intervention is needed.  Return to clinic in 4 months with repeat laboratory work, further evaluation, and continuation of treatment if needed.   Leukocytosis: Mild.  Will get peripheral blood flow cytometry with next blood draw.   B12 deficiency: Patient's most recent B12 levels were within normal limits.  I spent a total of 20 minutes reviewing chart data, face-to-face evaluation with the patient, counseling and coordination of care as detailed above.   Patient expressed understanding and was in agreement with this plan. She also understands that She can call clinic at any time with any questions, concerns, or complaints.    Jeralyn Ruths, MD   02/05/2024 2:32 PM

## 2024-03-20 ENCOUNTER — Ambulatory Visit: Payer: Self-pay | Admitting: General Surgery

## 2024-03-20 NOTE — H&P (View-Only) (Signed)
 History of Present Illness Kimberly Mcfarland is a 43 year old female who presents to coordinate surgery for removal of scalp cysts.  She has a history of a previous cyst removal in 2019. Currently, she has a large cyst localized in the same area as the previous one, along with multiple other cysts on her scalp. Three cysts are particularly problematic: one on the upper left forehead and two on the left temporal area.  She was previously prescribed hydrocodone for pain management after her last cyst removal, which she took as needed. Tylenol, ibuprofen, and ice packs were effective in controlling pain, and she usually does not need hydrocodone for long. She currently does not have any hydrocodone at home as it has been a while since her last procedure.      PAST MEDICAL HISTORY:  Past Medical History:  Diagnosis Date   Asthma without status asthmaticus (HHS-HCC)         PAST SURGICAL HISTORY:   Past Surgical History:  Procedure Laterality Date   EGD  03/31/2021   Gastritis/Repeat as needed/CTL         MEDICATIONS:  Outpatient Encounter Medications as of 03/20/2024  Medication Sig Dispense Refill   acetaminophen (TYLENOL) 500 MG tablet Take by mouth     esomeprazole (NEXIUM) 40 MG DR capsule Take 1 capsule (40 mg total) by mouth once daily 90 capsule 1   etonogestrel (NEXPLANON) 68 mg implant Inject 1 each into the skin once     levalbuterol (XOPENEX HFA) inhaler Inhale 2 inhalations into the lungs every 6 (six) hours as needed for Wheezing for up to 180 days 60 g 0   sucralfate (CARAFATE) 1 gram tablet Take 1 tablet (1 g total) by mouth 2 (two) times daily before meals 180 tablet 1   No facility-administered encounter medications on file as of 03/20/2024.     ALLERGIES:   Sulfa (sulfonamide antibiotics), Famotidine, Levaquin [levofloxacin], Oxycodone, and Penicillin   SOCIAL HISTORY:  Social History   Socioeconomic History   Marital status: Married  Tobacco Use   Smoking  status: Never   Smokeless tobacco: Never  Vaping Use   Vaping status: Never Used  Substance and Sexual Activity   Alcohol use: No   Sexual activity: Defer   Social Drivers of Corporate investment banker Strain: Low Risk  (01/17/2024)   Overall Financial Resource Strain (CARDIA)    Difficulty of Paying Living Expenses: Not very hard  Food Insecurity: No Food Insecurity (01/17/2024)   Hunger Vital Sign    Worried About Running Out of Food in the Last Year: Never true    Ran Out of Food in the Last Year: Never true  Transportation Needs: No Transportation Needs (01/17/2024)   PRAPARE - Administrator, Civil Service (Medical): No    Lack of Transportation (Non-Medical): No    FAMILY HISTORY:  Family History  Problem Relation Name Age of Onset   High blood pressure (Hypertension) Mother     Diabetes type II Mother     Obesity Mother     Myocardial Infarction (Heart attack) Father     Myocardial Infarction (Heart attack) Paternal Grandfather       GENERAL REVIEW OF SYSTEMS:   General ROS: negative for - chills, fatigue, fever, weight gain or weight loss Allergy and Immunology ROS: negative for - hives  Hematological and Lymphatic ROS: negative for - bleeding problems or bruising, negative for palpable nodes Endocrine ROS: negative for - heat or  cold intolerance, hair changes Respiratory ROS: negative for - cough, shortness of breath or wheezing Cardiovascular ROS: no chest pain or palpitations GI ROS: negative for nausea, vomiting, abdominal pain, diarrhea, constipation Musculoskeletal ROS: negative for - joint swelling or muscle pain Neurological ROS: negative for - confusion, syncope Dermatological ROS: negative for pruritus and rash  PHYSICAL EXAM:  Vitals:   03/20/24 0853  BP: 128/87  Pulse: 73  .  Ht:160 cm (5\' 3" ) Wt:(!) 113.9 kg (251 lb) ZOX:WRUE surface area is 2.25 meters squared. Body mass index is 44.46 kg/m.Marland Kitchen   GENERAL: Alert, active, oriented  x3  HEENT: Pupils equal reactive to light. Extraocular movements are intact. Sclera clear. Palpebral conjunctiva normal red color.multiple scalp cyst (more than 8) but 3 bigger Jeralyn Bennett wanting the left anterior upper forehead and to on the left temporal area   NECK: Supple with no palpable mass and no adenopathy.  LUNGS: Sound clear with no rales rhonchi or wheezes.  HEART: Regular rhythm S1 and S2 without murmur.  EXTREMITIES: Well-developed well-nourished symmetrical with no dependent edema.  NEUROLOGICAL: Awake alert oriented, facial expression symmetrical, moving all extremities.    Assessment & Plan Scalp cysts   She has multiple recurrent scalp cysts, with three symptomatic ones on the upper left forehead and left temporal area. Previous removal occurred in 2019. Surgical intervention is necessary due to symptoms and recurrence. The procedure involves elliptical incisions, internal stitches, and external skin glue. Risks include post-surgical fluid accumulation, typically reabsorbed by the body. Only symptomatic cysts will be removed to minimize recurrence and scarring. Schedule surgical removal of the three symptomatic cysts on April 07, 2024. Perform hair removal in the local area of the cysts prior to surgery. Administer anesthesia and local anesthetic during the procedure. Make elliptical incisions to remove the cysts and close with internal stitches and external skin glue. Advise her to wash hair normally with water and shampoo the day after surgery, patting the area dry. Recommend use of ice packs to manage post-surgical swelling. Prescribe hydrocodone for pain management as needed, with acetaminophen, ibuprofen, and ice packs as additional options for pain control.   Scalp cyst [L72.9]          Patient verbalized understanding, all questions were answered, and were agreeable with the plan outlined above.   Carolan Shiver, MD  Electronically signed by Carolan Shiver,  MD

## 2024-03-20 NOTE — H&P (Signed)
 History of Present Illness Kimberly Mcfarland is a 43 year old female who presents to coordinate surgery for removal of scalp cysts.  She has a history of a previous cyst removal in 2019. Currently, she has a large cyst localized in the same area as the previous one, along with multiple other cysts on her scalp. Three cysts are particularly problematic: one on the upper left forehead and two on the left temporal area.  She was previously prescribed hydrocodone for pain management after her last cyst removal, which she took as needed. Tylenol, ibuprofen, and ice packs were effective in controlling pain, and she usually does not need hydrocodone for long. She currently does not have any hydrocodone at home as it has been a while since her last procedure.      PAST MEDICAL HISTORY:  Past Medical History:  Diagnosis Date   Asthma without status asthmaticus (HHS-HCC)         PAST SURGICAL HISTORY:   Past Surgical History:  Procedure Laterality Date   EGD  03/31/2021   Gastritis/Repeat as needed/CTL         MEDICATIONS:  Outpatient Encounter Medications as of 03/20/2024  Medication Sig Dispense Refill   acetaminophen (TYLENOL) 500 MG tablet Take by mouth     esomeprazole (NEXIUM) 40 MG DR capsule Take 1 capsule (40 mg total) by mouth once daily 90 capsule 1   etonogestrel (NEXPLANON) 68 mg implant Inject 1 each into the skin once     levalbuterol (XOPENEX HFA) inhaler Inhale 2 inhalations into the lungs every 6 (six) hours as needed for Wheezing for up to 180 days 60 g 0   sucralfate (CARAFATE) 1 gram tablet Take 1 tablet (1 g total) by mouth 2 (two) times daily before meals 180 tablet 1   No facility-administered encounter medications on file as of 03/20/2024.     ALLERGIES:   Sulfa (sulfonamide antibiotics), Famotidine, Levaquin [levofloxacin], Oxycodone, and Penicillin   SOCIAL HISTORY:  Social History   Socioeconomic History   Marital status: Married  Tobacco Use   Smoking  status: Never   Smokeless tobacco: Never  Vaping Use   Vaping status: Never Used  Substance and Sexual Activity   Alcohol use: No   Sexual activity: Defer   Social Drivers of Corporate investment banker Strain: Low Risk  (01/17/2024)   Overall Financial Resource Strain (CARDIA)    Difficulty of Paying Living Expenses: Not very hard  Food Insecurity: No Food Insecurity (01/17/2024)   Hunger Vital Sign    Worried About Running Out of Food in the Last Year: Never true    Ran Out of Food in the Last Year: Never true  Transportation Needs: No Transportation Needs (01/17/2024)   PRAPARE - Administrator, Civil Service (Medical): No    Lack of Transportation (Non-Medical): No    FAMILY HISTORY:  Family History  Problem Relation Name Age of Onset   High blood pressure (Hypertension) Mother     Diabetes type II Mother     Obesity Mother     Myocardial Infarction (Heart attack) Father     Myocardial Infarction (Heart attack) Paternal Grandfather       GENERAL REVIEW OF SYSTEMS:   General ROS: negative for - chills, fatigue, fever, weight gain or weight loss Allergy and Immunology ROS: negative for - hives  Hematological and Lymphatic ROS: negative for - bleeding problems or bruising, negative for palpable nodes Endocrine ROS: negative for - heat or  cold intolerance, hair changes Respiratory ROS: negative for - cough, shortness of breath or wheezing Cardiovascular ROS: no chest pain or palpitations GI ROS: negative for nausea, vomiting, abdominal pain, diarrhea, constipation Musculoskeletal ROS: negative for - joint swelling or muscle pain Neurological ROS: negative for - confusion, syncope Dermatological ROS: negative for pruritus and rash  PHYSICAL EXAM:  Vitals:   03/20/24 0853  BP: 128/87  Pulse: 73  .  Ht:160 cm (5\' 3" ) Wt:(!) 113.9 kg (251 lb) ZOX:WRUE surface area is 2.25 meters squared. Body mass index is 44.46 kg/m.Marland Kitchen   GENERAL: Alert, active, oriented  x3  HEENT: Pupils equal reactive to light. Extraocular movements are intact. Sclera clear. Palpebral conjunctiva normal red color.multiple scalp cyst (more than 8) but 3 bigger Jeralyn Bennett wanting the left anterior upper forehead and to on the left temporal area   NECK: Supple with no palpable mass and no adenopathy.  LUNGS: Sound clear with no rales rhonchi or wheezes.  HEART: Regular rhythm S1 and S2 without murmur.  EXTREMITIES: Well-developed well-nourished symmetrical with no dependent edema.  NEUROLOGICAL: Awake alert oriented, facial expression symmetrical, moving all extremities.    Assessment & Plan Scalp cysts   She has multiple recurrent scalp cysts, with three symptomatic ones on the upper left forehead and left temporal area. Previous removal occurred in 2019. Surgical intervention is necessary due to symptoms and recurrence. The procedure involves elliptical incisions, internal stitches, and external skin glue. Risks include post-surgical fluid accumulation, typically reabsorbed by the body. Only symptomatic cysts will be removed to minimize recurrence and scarring. Schedule surgical removal of the three symptomatic cysts on April 07, 2024. Perform hair removal in the local area of the cysts prior to surgery. Administer anesthesia and local anesthetic during the procedure. Make elliptical incisions to remove the cysts and close with internal stitches and external skin glue. Advise her to wash hair normally with water and shampoo the day after surgery, patting the area dry. Recommend use of ice packs to manage post-surgical swelling. Prescribe hydrocodone for pain management as needed, with acetaminophen, ibuprofen, and ice packs as additional options for pain control.   Scalp cyst [L72.9]          Patient verbalized understanding, all questions were answered, and were agreeable with the plan outlined above.   Carolan Shiver, MD  Electronically signed by Carolan Shiver,  MD

## 2024-03-25 ENCOUNTER — Encounter: Payer: Self-pay | Admitting: Oncology

## 2024-03-28 ENCOUNTER — Other Ambulatory Visit: Payer: Self-pay

## 2024-03-28 ENCOUNTER — Encounter
Admission: RE | Admit: 2024-03-28 | Discharge: 2024-03-28 | Disposition: A | Discharge status: Home / Self Care | Payer: Self-pay | Source: Ambulatory Visit | Attending: General Surgery | Admitting: General Surgery

## 2024-03-28 VITALS — Ht 63.0 in | Wt 250.0 lb

## 2024-03-28 DIAGNOSIS — Z01818 Encounter for other preprocedural examination: Secondary | ICD-10-CM

## 2024-03-28 HISTORY — DX: Sleep apnea, unspecified: G47.30

## 2024-03-28 NOTE — Patient Instructions (Addendum)
 Your procedure is scheduled on: Monday 04/07/24 To find out your arrival time, please call [phone] between 1PM - 3PM on: Friday 04/04/24   Report to the Registration Desk on the 1st floor of the [facility]. FREE Valet parking is available.  If your arrival time is 6:00 am, do not arrive before that time as the [facility] entrance doors do not open until 6:00 am.  REMEMBER: Instructions that are not followed completely may result in serious medical risk, up to and including death; or upon the discretion of your surgeon and anesthesiologist your surgery may need to be rescheduled.  Do not eat food or drink any liquids after midnight the night before surgery.  No gum chewing or hard candies.  One week prior to surgery: Stop Anti-inflammatories (NSAIDS) such as Advil, Aleve, Ibuprofen, Motrin, Naproxen, Naprosyn and Aspirin based products such as Excedrin, Goody's Powder, BC Powder. You may however, continue to take Tylenol if needed for pain up until the day of surgery.  Stop ANY OVER THE COUNTER supplements until after surgery.  Continue taking all prescribed medications.   TAKE ONLY THESE MEDICATIONS THE MORNING OF SURGERY WITH A SIP OF WATER:  Nexium  Use inhalers on the day of surgery and bring to the hospital.  No Alcohol for 24 hours before or after surgery.  No Smoking including e-cigarettes for 24 hours before surgery.  No chewable tobacco products for at least 6 hours before surgery.  No nicotine patches on the day of surgery.  Do not use any "recreational" drugs for at least a week (preferably 2 weeks) before your surgery.  Please be advised that the combination of cocaine and anesthesia may have negative outcomes, up to and including death. If you test positive for cocaine, your surgery will be cancelled.  On the morning of surgery brush your teeth with toothpaste and water, you may rinse your mouth with mouthwash if you wish. Do not swallow any toothpaste or  mouthwash.  Shower and wash hair the morning of your surgery.  Do not wear lotions, powders, or perfumes.   Do not shave body hair from the neck down 48 hours before surgery.  Wear comfortable clothing (specific to your surgery type) to the hospital.  Do not wear jewelry, make-up, hairpins, clips or nail polish.  For welded (permanent) jewelry: bracelets, anklets, waist bands, etc.  Please have this removed prior to surgery.  If it is not removed, there is a chance that hospital personnel will need to cut it off on the day of surgery. Contact lenses, hearing aids and dentures may not be worn into surgery.  Bring your C-PAP to the hospital in case you may have to spend the night. You may leave this in the car.  Do not bring valuables to the hospital. [facility] is not responsible for any missing/lost belongings or valuables.   Notify your doctor if there is any change in your medical condition (cold, fever, infection).  If you are being discharged the day of surgery, you will not be allowed to drive home. You will need a responsible individual to drive you home and stay with you for 24 hours after surgery.   If you are taking public transportation, you will need to have a responsible individual with you.  If you are being admitted to the hospital overnight, leave your suitcase in the car. After surgery it may be brought to your room.  In case of increased patient census, it may be necessary for  you, the patient, to continue your postoperative care in the Same Day Surgery department.  After surgery, you can help prevent lung complications by doing breathing exercises.  Take deep breaths and cough every 1-2 hours. Your doctor may order a device called an Incentive Spirometer to help you take deep breaths. When coughing or sneezing, hold a pillow firmly against your incision with both hands. This is called "splinting." Doing this helps protect your incision. It also decreases belly  discomfort.  Surgery Visitation Policy:  Patients undergoing a surgery or procedure may have two family members or support persons with them as long as the person is not COVID-19 positive or experiencing its symptoms.   Inpatient Visitation:    Visiting hours are 7 a.m. to 8 p.m. Up to four visitors are allowed at one time in a patient room. The visitors may rotate out with other people during the day. One designated support person (adult) may remain overnight.  Please call the Pre-admissions Testing Dept. at [phone] if you have any questions about these instructions.

## 2024-04-05 NOTE — Anesthesia Preprocedure Evaluation (Signed)
 Anesthesia Evaluation  Patient identified by MRN, date of birth, ID band Patient awake    Reviewed: Allergy & Precautions, H&P , NPO status , Patient's Chart, lab work & pertinent test results  Airway Mallampati: II  TM Distance: >3 FB Neck ROM: full    Dental no notable dental hx.    Pulmonary asthma , sleep apnea    Pulmonary exam normal        Cardiovascular hypertension, Normal cardiovascular exam     Neuro/Psych negative neurological ROS  negative psych ROS   GI/Hepatic Neg liver ROS,GERD  ,,  Endo/Other  Hypothyroidism  Class 3 obesity  Renal/GU      Musculoskeletal   Abdominal  (+) + obese  Peds  Hematology  (+) Blood dyscrasia   Anesthesia Other Findings Past Medical History: No date: Anemia     Comment:  after pregnancy No date: Asthma     Comment:  well controlled-has not used inhaler in years No date: Complication of anesthesia     Comment:  difficulty voiding postop No date: Family history of adverse reaction to anesthesia     Comment:  mom woke up during surgery 05/04/2014: Family history of hemophilia     Comment:  patient has never been tested No date: GERD (gastroesophageal reflux disease) No date: Heart murmur     Comment:  as child No date: Hypothyroid     Comment:  h/o outgrew 04/27/2020: IDA (iron deficiency anemia) No date: Morbid obesity (HCC) 05/02/2018: Multiple large sebaceous cysts of scalp No date: PCOS (polycystic ovarian syndrome) No date: Sleep apnea  Past Surgical History: 03/08/2018: CHOLECYSTECTOMY; N/A     Comment:  Procedure: LAPAROSCOPIC CHOLECYSTECTOMY;  Surgeon:               Franki Isles, MD;  Location: [facility] ORS;  Service:               General;  Laterality: N/A; 2010: CYST EXCISION     Comment:  head cysts x 14.  high levels of keratin 02/16/2016: LESION EXCISION; N/A     Comment:  Procedure: EXCISION SCALP LESION,excision 2 large scalp                cysrts with intermediate closure;  Surgeon: Jerlean Mood, MD;  Location: [facility] ORS;  Service: General;                Laterality: N/A; 05/13/2018: LESION EXCISION; N/A     Comment:  Procedure: EXCISION SCALP LESION;  Surgeon: Franki Isles, MD;  Location: [facility] ORS;  Service: General;                Laterality: N/A; No date: WISDOM TOOTH EXTRACTION     Reproductive/Obstetrics negative OB ROS                              Anesthesia Physical Anesthesia Plan  ASA: 3  Anesthesia Plan: General   Post-op Pain Management: Regional block*, Celebrex PO (pre-op)*, Tylenol PO (pre-op)* and Gabapentin PO (pre-op)*   Induction: Intravenous  PONV Risk Score and Plan: 3 and Dexamethasone, Ondansetron and Midazolam  Airway Management Planned: LMA and Oral ETT  Additional Equipment:   Intra-op Plan:   Post-operative Plan:   Informed Consent:  Dental Advisory Given  Plan Discussed with: Anesthesiologist, CRNA and Surgeon  Anesthesia Plan Comments:          Anesthesia Quick Evaluation

## 2024-04-06 MED ORDER — LACTATED RINGERS IV SOLN: INTRAVENOUS | Status: DC

## 2024-04-06 MED ORDER — CELECOXIB 200 MG PO CAPS: 200.0000 mg | ORAL_CAPSULE | Freq: Once | ORAL | Status: DC

## 2024-04-06 MED ORDER — CEFAZOLIN SODIUM-DEXTROSE 2-4 GM/100ML-% IV SOLN
2.0000 g | INTRAVENOUS | Status: AC
  Administered 2024-04-07: 2 g via INTRAVENOUS

## 2024-04-06 MED ORDER — ORAL CARE MOUTH RINSE: 15.0000 mL | Freq: Once | OROMUCOSAL | Status: DC

## 2024-04-06 MED ORDER — CHLORHEXIDINE GLUCONATE 0.12 % MT SOLN: 15.0000 mL | Freq: Once | OROMUCOSAL | Status: DC

## 2024-04-06 MED ORDER — ACETAMINOPHEN 500 MG PO TABS: 1000.0000 mg | ORAL_TABLET | Freq: Once | ORAL | Status: DC

## 2024-04-06 MED ORDER — GABAPENTIN 300 MG PO CAPS: 300.0000 mg | ORAL_CAPSULE | Freq: Once | ORAL | Status: DC

## 2024-04-07 ENCOUNTER — Other Ambulatory Visit: Payer: Self-pay

## 2024-04-07 ENCOUNTER — Ambulatory Visit: Payer: Self-pay | Admitting: Anesthesiology

## 2024-04-07 ENCOUNTER — Encounter: Payer: Self-pay | Admitting: General Surgery

## 2024-04-07 ENCOUNTER — Ambulatory Visit
Admission: RE | Admit: 2024-04-07 | Discharge: 2024-04-07 | Disposition: A | Discharge status: Home / Self Care | Payer: Self-pay | Attending: General Surgery | Admitting: General Surgery

## 2024-04-07 ENCOUNTER — Encounter
Admission: RE | Disposition: A | Discharge status: Home / Self Care | Payer: Self-pay | Source: Home / Self Care | Attending: General Surgery

## 2024-04-07 DIAGNOSIS — G4730 Sleep apnea, unspecified: Secondary | ICD-10-CM | POA: Insufficient documentation

## 2024-04-07 DIAGNOSIS — L7211 Pilar cyst: Secondary | ICD-10-CM | POA: Insufficient documentation

## 2024-04-07 DIAGNOSIS — E039 Hypothyroidism, unspecified: Secondary | ICD-10-CM | POA: Insufficient documentation

## 2024-04-07 DIAGNOSIS — K219 Gastro-esophageal reflux disease without esophagitis: Secondary | ICD-10-CM | POA: Insufficient documentation

## 2024-04-07 DIAGNOSIS — L7212 Trichodermal cyst: Secondary | ICD-10-CM | POA: Insufficient documentation

## 2024-04-07 DIAGNOSIS — E66813 Obesity, class 3: Secondary | ICD-10-CM | POA: Diagnosis not present

## 2024-04-07 DIAGNOSIS — Z6841 Body Mass Index (BMI) 40.0 and over, adult: Secondary | ICD-10-CM | POA: Diagnosis not present

## 2024-04-07 DIAGNOSIS — Z01818 Encounter for other preprocedural examination: Secondary | ICD-10-CM

## 2024-04-07 HISTORY — PX: CYST EXCISION: SHX5701

## 2024-04-07 LAB — POCT PREGNANCY, URINE: Preg Test, Ur: NEGATIVE

## 2024-04-07 SURGERY — CYST REMOVAL
Anesthesia: General | Site: Scalp | Wound class: Clean

## 2024-04-07 MED ORDER — FENTANYL CITRATE (PF) 100 MCG/2ML IJ SOLN
INTRAMUSCULAR | Status: AC
  Filled 2024-04-07: qty 2

## 2024-04-07 MED ORDER — ONDANSETRON HCL 4 MG/2ML IJ SOLN
INTRAMUSCULAR | Status: DC | PRN
  Administered 2024-04-07: 4 mg via INTRAVENOUS

## 2024-04-07 MED ORDER — ACETAMINOPHEN 10 MG/ML IV SOLN
INTRAVENOUS | Status: DC | PRN
  Administered 2024-04-07: 1000 mg via INTRAVENOUS

## 2024-04-07 MED ORDER — FENTANYL CITRATE (PF) 100 MCG/2ML IJ SOLN
INTRAMUSCULAR | Status: DC | PRN
  Administered 2024-04-07: 60 ug via INTRAVENOUS
  Administered 2024-04-07 (×4): 50 ug via INTRAVENOUS

## 2024-04-07 MED ORDER — LIDOCAINE HCL (CARDIAC) PF 100 MG/5ML IV SOSY
PREFILLED_SYRINGE | INTRAVENOUS | Status: DC | PRN
  Administered 2024-04-07: 100 mg via INTRAVENOUS

## 2024-04-07 MED ORDER — CHLORHEXIDINE GLUCONATE 0.12 % MT SOLN
OROMUCOSAL | Status: AC
  Filled 2024-04-07: qty 15

## 2024-04-07 MED ORDER — LACTATED RINGERS IV SOLN: INTRAVENOUS | Status: DC | PRN

## 2024-04-07 MED ORDER — ACETAMINOPHEN 10 MG/ML IV SOLN
INTRAVENOUS | Status: AC
  Filled 2024-04-07: qty 100

## 2024-04-07 MED ORDER — DEXAMETHASONE SODIUM PHOSPHATE 10 MG/ML IJ SOLN
INTRAMUSCULAR | Status: AC
  Filled 2024-04-07: qty 1

## 2024-04-07 MED ORDER — KETOROLAC TROMETHAMINE 30 MG/ML IJ SOLN
INTRAMUSCULAR | Status: DC | PRN
  Administered 2024-04-07: 30 mg via INTRAVENOUS

## 2024-04-07 MED ORDER — DEXAMETHASONE SODIUM PHOSPHATE 10 MG/ML IJ SOLN
INTRAMUSCULAR | Status: DC | PRN
  Administered 2024-04-07: 10 mg via INTRAVENOUS

## 2024-04-07 MED ORDER — HYDROMORPHONE HCL 1 MG/ML IJ SOLN: 0.2500 mg | INTRAMUSCULAR | Status: DC | PRN

## 2024-04-07 MED ORDER — MIDAZOLAM HCL 2 MG/2ML IJ SOLN
INTRAMUSCULAR | Status: AC
  Filled 2024-04-07: qty 2

## 2024-04-07 MED ORDER — DROPERIDOL 2.5 MG/ML IJ SOLN: 0.6250 mg | Freq: Once | INTRAMUSCULAR | Status: DC | PRN

## 2024-04-07 MED ORDER — CEFAZOLIN SODIUM-DEXTROSE 2-4 GM/100ML-% IV SOLN
INTRAVENOUS | Status: AC
  Filled 2024-04-07: qty 100

## 2024-04-07 MED ORDER — HEMOSTATIC AGENTS (NO CHARGE) OPTIME
TOPICAL | Status: DC | PRN
  Administered 2024-04-07: 1 via TOPICAL

## 2024-04-07 MED ORDER — PHENYLEPHRINE 80 MCG/ML (10ML) SYRINGE FOR IV PUSH (FOR BLOOD PRESSURE SUPPORT)
PREFILLED_SYRINGE | INTRAVENOUS | Status: AC
  Filled 2024-04-07: qty 10

## 2024-04-07 MED ORDER — PROPOFOL 1000 MG/100ML IV EMUL
INTRAVENOUS | Status: AC
  Filled 2024-04-07: qty 100

## 2024-04-07 MED ORDER — HYDROCODONE-ACETAMINOPHEN 5-325 MG PO TABS: 1.0000 | ORAL_TABLET | Freq: Four times a day (QID) | ORAL | 0 refills | Status: AC | PRN

## 2024-04-07 MED ORDER — BUPIVACAINE-EPINEPHRINE 0.5% -1:200000 IJ SOLN
INTRAMUSCULAR | Status: DC | PRN
  Administered 2024-04-07: 18 mL

## 2024-04-07 MED ORDER — PROPOFOL 10 MG/ML IV BOLUS
INTRAVENOUS | Status: DC | PRN
  Administered 2024-04-07: 50 ug/kg/min via INTRAVENOUS

## 2024-04-07 MED ORDER — PHENYLEPHRINE 80 MCG/ML (10ML) SYRINGE FOR IV PUSH (FOR BLOOD PRESSURE SUPPORT)
PREFILLED_SYRINGE | INTRAVENOUS | Status: DC | PRN
  Administered 2024-04-07 (×4): 80 ug via INTRAVENOUS

## 2024-04-07 MED ORDER — LIDOCAINE HCL (PF) 2 % IJ SOLN
INTRAMUSCULAR | Status: AC
  Filled 2024-04-07: qty 5

## 2024-04-07 MED ORDER — KETOROLAC TROMETHAMINE 30 MG/ML IJ SOLN
INTRAMUSCULAR | Status: AC
  Filled 2024-04-07: qty 1

## 2024-04-07 MED ORDER — ACETAMINOPHEN 10 MG/ML IV SOLN: 1000.0000 mg | Freq: Once | INTRAVENOUS | Status: DC | PRN

## 2024-04-07 MED ORDER — ONDANSETRON HCL 4 MG/2ML IJ SOLN
INTRAMUSCULAR | Status: AC
  Filled 2024-04-07: qty 2

## 2024-04-07 MED ORDER — BUPIVACAINE-EPINEPHRINE (PF) 0.5% -1:200000 IJ SOLN
INTRAMUSCULAR | Status: AC
  Filled 2024-04-07: qty 30

## 2024-04-07 MED ORDER — MIDAZOLAM HCL 2 MG/2ML IJ SOLN
INTRAMUSCULAR | Status: DC | PRN
  Administered 2024-04-07: 2 mg via INTRAVENOUS

## 2024-04-07 SURGICAL SUPPLY — 25 items
ADHESIVE MASTISOL STRL (MISCELLANEOUS) IMPLANT
CHLORAPREP W/TINT 26 (MISCELLANEOUS) IMPLANT
DERMABOND ADVANCED .7 DNX12 (GAUZE/BANDAGES/DRESSINGS) ×1 IMPLANT
DRAPE LAPAROTOMY 77X122 PED (DRAPES) ×1 IMPLANT
ELECT REM PT RETURN 9FT ADLT (ELECTROSURGICAL) ×1 IMPLANT
ELECTRODE REM PT RTRN 9FT ADLT (ELECTROSURGICAL) ×1 IMPLANT
GAUZE 4X4 16PLY ~~LOC~~+RFID DBL (SPONGE) IMPLANT
GLOVE BIO SURGEON STRL SZ 6.5 (GLOVE) ×1 IMPLANT
GLOVE BIOGEL PI IND STRL 6.5 (GLOVE) ×1 IMPLANT
KIT TURNOVER KIT A (KITS) ×1 IMPLANT
LABEL OR SOLS (LABEL) ×1 IMPLANT
MANIFOLD NEPTUNE II (INSTRUMENTS) ×1 IMPLANT
NDL HYPO 22X1.5 SAFETY MO (MISCELLANEOUS) ×1 IMPLANT
NEEDLE HYPO 22X1.5 SAFETY MO (MISCELLANEOUS) ×1 IMPLANT
NS IRRIG 500ML POUR BTL (IV SOLUTION) ×1 IMPLANT
PACK BASIN MINOR ARMC (MISCELLANEOUS) ×1 IMPLANT
POWDER SURGICEL 3.0 GRAM (HEMOSTASIS) IMPLANT
STRIP CLOSURE SKIN 1/2X4 (GAUZE/BANDAGES/DRESSINGS) IMPLANT
SUT ETHILON 3-0 (SUTURE) IMPLANT
SUT MNCRL 4-0 27 PS-2 XMFL (SUTURE) ×1 IMPLANT
SUT VIC AB 2-0 SH 27XBRD (SUTURE) ×1 IMPLANT
SUTURE MNCRL 4-0 27XMF (SUTURE) ×1 IMPLANT
SYR 10ML LL (SYRINGE) ×1 IMPLANT
TRAP FLUID SMOKE EVACUATOR (MISCELLANEOUS) ×1 IMPLANT
WATER STERILE IRR 500ML POUR (IV SOLUTION) ×1 IMPLANT

## 2024-04-07 NOTE — Transfer of Care (Signed)
 Immediate Anesthesia Transfer of Care Note  Patient: Kimberly Mcfarland  Procedure(s) Performed: CYST REMOVAL (Scalp)  Patient Location: PACU  Anesthesia Type:General  Level of Consciousness: drowsy  Airway & Oxygen Therapy: Patient Spontanous Breathing and Patient connected to face mask oxygen  Post-op Assessment: Report given to RN and Post -op Vital signs reviewed and stable  Post vital signs: Reviewed and stable  Last Vitals:  Vitals Value Taken Time  BP 135/92 04/07/24 0958  Temp    Pulse 79 04/07/24 1000  Resp 13 04/07/24 1000  SpO2 100 % 04/07/24 1000  Vitals shown include unfiled device data.  Last Pain:  Vitals:   04/07/24 0639  TempSrc: Oral  PainSc: 0-No pain      Patients Stated Pain Goal: 0 (04/07/24 4403)  Complications: There were no known notable events for this encounter.

## 2024-04-07 NOTE — Discharge Instructions (Signed)
  Diet: Resume home heart healthy regular diet.   Activity: Increase activity as tolerated.   Wound care: Remove dressing tomorrow. Once dressing removed, may shower with soapy water but do not wash hair for at least 5 days  Medications: Resume all home medications. For mild to moderate pain: acetaminophen (Tylenol) or ibuprofen (if no kidney disease). Combining Tylenol with alcohol can substantially increase your risk of causing liver disease. Narcotic pain medications, if prescribed, can be used for severe pain, though may cause nausea, constipation, and drowsiness. Do not combine Tylenol and Norco within a 6 hour period as Norco contains Tylenol. If you do not need the narcotic pain medication, you do not need to fill the prescription.  Call office [phone]) at any time if any questions, worsening pain, fevers/chills, bleeding, drainage from incision site, or other concerns.

## 2024-04-07 NOTE — Op Note (Addendum)
 OPERATION REPORT  Pre Operative Diagnosis: Multiple scalp cyst  Post operative diagnosis: Multiple scalp cyst (Temporal x1, Occipital x2)  Anesthesia: MAC and Local   Surgeon: Dr. Dortha Gauss  Findings:  Two cysts on the temporal area attached to each other measuring 8 cm Two cysts on the occipital area separated 3 to each other ; one measured 4.8 cm and the second one measured 4.5 cm.  These two cc were removed from separate incisions.   Indication: This 43 y.o. year old female with a soft tissue mass that is causing   Description of procedure: after orienting patient about the procedure steps and benefits and patient agreed to proceed. Time out was done identifying correct patient and location of procedure with temporal cyst were removed first.  . After induction of MAC, the temporal area was cleaned and draped in the usual manner.  Local anesthesia was infiltrated around the palpable lesion. With a blade #15, an elliptical incision was made using the skin lines. Sharp dissection was carried down and lesion was excised including dermal tissue. The mass is combined measure 8 cm. Deep dermal stitches were done with vicryl 4-0 to repair the laceration and skin closed with Monocryl 4-0 in subcuticular fashion.  Then the occipital cyst were removed through individual incisions.  Once its major 4.8 cm and the second cyst measure 4.5 cm.  Both of these were removed completely and the laceration was repaired in multiple layers.  Specimen sent to pathology.   Specimens: Two temporal cysts taken as one specimen                        Two separate occipital cyst   Complications: none   EBL: minimal  Eldred Grego, MD, FACS

## 2024-04-07 NOTE — Interval H&P Note (Signed)
 History and Physical Interval Note:  04/07/2024 7:01 AM  Kimberly Mcfarland  has presented today for surgery, with the diagnosis of L72.9 scalp cyst.  The various methods of treatment have been discussed with the patient and family. After consideration of risks, benefits and other options for treatment, the patient has consented to  Procedure(s) with comments: CYST REMOVAL (N/A) - x 3 as a surgical intervention.  The patient's history has been reviewed, patient examined, no change in status, stable for surgery.  I have reviewed the patient's chart and labs.  Questions were answered to the patient's satisfaction.     Eldred Grego

## 2024-04-07 NOTE — Anesthesia Procedure Notes (Signed)
 Procedure Name: LMA Insertion Date/Time: 04/07/2024 7:40 AM  Performed by: Riyah Bardon R, CRNAPre-anesthesia Checklist: Patient identified, Emergency Drugs available, Suction available, Patient being monitored and Timeout performed Patient Re-evaluated:Patient Re-evaluated prior to induction Oxygen Delivery Method: Circle system utilized Preoxygenation: Pre-oxygenation with 100% oxygen Induction Type: IV induction LMA: LMA inserted LMA Size: 3.0 Number of attempts: 1 Tube secured with: Tape Dental Injury: Teeth and Oropharynx as per pre-operative assessment

## 2024-04-08 ENCOUNTER — Encounter: Payer: Self-pay | Admitting: General Surgery

## 2024-04-08 LAB — SURGICAL PATHOLOGY

## 2024-04-08 NOTE — Anesthesia Postprocedure Evaluation (Signed)
 Anesthesia Post Note  Patient: Monetta Angst  Procedure(s) Performed: CYST REMOVAL (Scalp)  Patient location during evaluation: PACU Anesthesia Type: General Level of consciousness: awake and alert Pain management: pain level controlled Vital Signs Assessment: post-procedure vital signs reviewed and stable Respiratory status: spontaneous breathing, nonlabored ventilation and respiratory function stable Cardiovascular status: blood pressure returned to baseline and stable Postop Assessment: no apparent nausea or vomiting Anesthetic complications: no   There were no known notable events for this encounter.   Last Vitals:  Vitals:   04/07/24 1030 04/07/24 1044  BP: 118/71 110/73  Pulse: 67 66  Resp: 16 15  Temp: 36.6 C 36.6 C  SpO2: 95% 97%    Last Pain:  Vitals:   04/07/24 1044  TempSrc: Temporal  PainSc: 0-No pain                 Baltazar Bonier

## 2024-06-04 ENCOUNTER — Inpatient Hospital Stay: Payer: 59 | Attending: Oncology

## 2024-06-04 DIAGNOSIS — Z8043 Family history of malignant neoplasm of testis: Secondary | ICD-10-CM | POA: Diagnosis not present

## 2024-06-04 DIAGNOSIS — Z800 Family history of malignant neoplasm of digestive organs: Secondary | ICD-10-CM | POA: Diagnosis not present

## 2024-06-04 DIAGNOSIS — I10 Essential (primary) hypertension: Secondary | ICD-10-CM | POA: Insufficient documentation

## 2024-06-04 DIAGNOSIS — D509 Iron deficiency anemia, unspecified: Secondary | ICD-10-CM | POA: Diagnosis present

## 2024-06-04 DIAGNOSIS — D72829 Elevated white blood cell count, unspecified: Secondary | ICD-10-CM | POA: Insufficient documentation

## 2024-06-04 DIAGNOSIS — Z803 Family history of malignant neoplasm of breast: Secondary | ICD-10-CM | POA: Insufficient documentation

## 2024-06-04 LAB — IRON AND TIBC
Iron: 39 ug/dL (ref 28–170)
Saturation Ratios: 10 % — ABNORMAL LOW (ref 10.4–31.8)
TIBC: 409 ug/dL (ref 250–450)
UIBC: 370 ug/dL

## 2024-06-04 LAB — CBC WITH DIFFERENTIAL/PLATELET
Abs Immature Granulocytes: 0.06 10*3/uL (ref 0.00–0.07)
Basophils Absolute: 0 10*3/uL (ref 0.0–0.1)
Basophils Relative: 0 %
Eosinophils Absolute: 0.3 10*3/uL (ref 0.0–0.5)
Eosinophils Relative: 2 %
HCT: 37.9 % (ref 36.0–46.0)
Hemoglobin: 12.7 g/dL (ref 12.0–15.0)
Immature Granulocytes: 1 %
Lymphocytes Relative: 18 %
Lymphs Abs: 2.2 10*3/uL (ref 0.7–4.0)
MCH: 29.2 pg (ref 26.0–34.0)
MCHC: 33.5 g/dL (ref 30.0–36.0)
MCV: 87.1 fL (ref 80.0–100.0)
Monocytes Absolute: 0.6 10*3/uL (ref 0.1–1.0)
Monocytes Relative: 5 %
Neutro Abs: 9.2 10*3/uL — ABNORMAL HIGH (ref 1.7–7.7)
Neutrophils Relative %: 74 %
Platelets: 319 10*3/uL (ref 150–400)
RBC: 4.35 MIL/uL (ref 3.87–5.11)
RDW: 13.3 % (ref 11.5–15.5)
WBC: 12.3 10*3/uL — ABNORMAL HIGH (ref 4.0–10.5)
nRBC: 0 % (ref 0.0–0.2)

## 2024-06-04 LAB — FERRITIN: Ferritin: 17 ng/mL (ref 11–307)

## 2024-06-05 ENCOUNTER — Encounter: Payer: Self-pay | Admitting: Oncology

## 2024-06-05 ENCOUNTER — Inpatient Hospital Stay: Payer: 59

## 2024-06-05 ENCOUNTER — Inpatient Hospital Stay: Payer: 59 | Admitting: Oncology

## 2024-06-05 VITALS — BP 142/78 | HR 95 | Temp 99.0°F | Resp 18 | Ht 63.0 in | Wt 256.0 lb

## 2024-06-05 VITALS — BP 136/82 | Temp 98.9°F | Resp 18

## 2024-06-05 DIAGNOSIS — D509 Iron deficiency anemia, unspecified: Secondary | ICD-10-CM

## 2024-06-05 MED ORDER — IRON SUCROSE 20 MG/ML IV SOLN
200.0000 mg | Freq: Once | INTRAVENOUS | Status: AC
  Administered 2024-06-05: 200 mg via INTRAVENOUS

## 2024-06-05 NOTE — Patient Instructions (Signed)

## 2024-06-05 NOTE — Progress Notes (Signed)
 Patient is doing ok she is just feeling tired.

## 2024-06-05 NOTE — Progress Notes (Signed)
 [facility]  [phone] [phone]  ID: Kimberly Mcfarland OB: Dec 13, 1981  MR#: [id number]  DGL#:[id number]  Patient Care Team: Antonio Baumgarten, MD as PCP - General (Internal Medicine) Suzzane Estes, DO as Consulting Physician (Family Medicine) Shellie Dials, MD as Consulting Physician (Oncology)  CHIEF COMPLAINT: Iron  deficiency anemia.  INTERVAL HISTORY: Patient returns to clinic today for repeat laboratory work, further evaluation, and consideration of IV Venofer .  She has increased fatigue, but otherwise feels well. She has no neurologic complaints.  She denies any recent fevers or illnesses.  She has a good appetite and denies weight loss.  She has no chest pain, shortness of breath, cough, or hemoptysis.  She denies any nausea, vomiting, constipation, or diarrhea.  She has no melena or hematochezia.  She has no urinary complaints.  Patient offers no further specific complaints today.  REVIEW OF SYSTEMS:   Review of Systems  Constitutional:  Positive for malaise/fatigue. Negative for fever and weight loss.  Respiratory: Negative.  Negative for cough, hemoptysis and shortness of breath.   Cardiovascular: Negative.  Negative for chest pain and leg swelling.  Gastrointestinal:  Negative for abdominal pain, blood in stool and melena.  Genitourinary: Negative.  Negative for hematuria.  Musculoskeletal: Negative.  Negative for back pain.  Skin: Negative.  Negative for rash.  Neurological: Negative.  Negative for dizziness, focal weakness and weakness.  Psychiatric/Behavioral: Negative.  The patient is not nervous/anxious.     As per HPI. Otherwise, a complete review of systems is negative.  PAST MEDICAL HISTORY: Past Medical History:  Diagnosis Date   Anemia    after pregnancy   Asthma    well controlled-has not used inhaler in years   Complication of anesthesia    difficulty voiding postop   Family history of adverse reaction  to anesthesia    mom woke up during surgery   Family history of hemophilia 05/04/2014   patient has never been tested   GERD (gastroesophageal reflux disease)    Heart murmur    as child   Hypothyroid    h/o outgrew   IDA (iron  deficiency anemia) 04/27/2020   Morbid obesity (HCC)    Multiple large sebaceous cysts of scalp 05/02/2018   PCOS (polycystic ovarian syndrome)    Sleep apnea     PAST SURGICAL HISTORY: Past Surgical History:  Procedure Laterality Date   CHOLECYSTECTOMY N/A 03/08/2018   Procedure: LAPAROSCOPIC CHOLECYSTECTOMY;  Surgeon: Franki Isles, MD;  Location: [facility];  Service: General;  Laterality: N/A;   CYST EXCISION  2010   head cysts x 14.  high levels of keratin   CYST EXCISION N/A 04/07/2024   Procedure: CYST REMOVAL;  Surgeon: Eldred Grego, MD;  Location: [facility];  Service: General;  Laterality: N/A;  x 3   LESION EXCISION N/A 02/16/2016   Procedure: EXCISION SCALP LESION,excision 2 large scalp cysrts with intermediate closure;  Surgeon: Jerlean Mood, MD;  Location: [facility];  Service: General;  Laterality: N/A;   LESION EXCISION N/A 05/13/2018   Procedure: EXCISION SCALP LESION;  Surgeon: Franki Isles, MD;  Location: [facility];  Service: General;  Laterality: N/A;   WISDOM TOOTH EXTRACTION      FAMILY HISTORY: Family History  Problem Relation Age of Onset   Diabetes Mother    Asthma Mother    Obesity Mother    Hypertension Mother    Hemophilia Mother    Heart disease Father    Heart attack Father  Testicular cancer Father    Breast cancer Other    Diabetes Maternal Grandmother        type 2   Factor VIII deficiency Other        Also has factor 9 deficiency   Factor VIII deficiency Other        also has factor 9 deficiency   Hemophilia Maternal Grandfather    Esophageal cancer Maternal Grandfather    Pancreatic cancer Paternal Grandmother     ADVANCED DIRECTIVES (Y/N):  N  HEALTH MAINTENANCE: Social History    Tobacco Use   Smoking status: Never   Smokeless tobacco: Never  Vaping Use   Vaping status: Never Used  Substance Use Topics   Alcohol use: No    Alcohol/week: 0.0 standard drinks of alcohol   Drug use: No     Colonoscopy:  PAP:  Bone density:  Lipid panel:  Allergies  Allergen Reactions   Sulfa Antibiotics Other (See Comments)    seizures   Levaquin [Levofloxacin In D5w] Hives   Other Hives    dermabond   Penicillins Hives   Pepcid  [Famotidine ] Other (See Comments)    Dizziness   Oxycodone  Itching and Rash    Current Outpatient Medications  Medication Sig Dispense Refill   acetaminophen  (TYLENOL ) 500 MG tablet Take 1,000 mg by mouth every 6 (six) hours as needed for fever or headache.      albuterol  (VENTOLIN  HFA) 108 (90 Base) MCG/ACT inhaler Inhale 2 puffs into the lungs every 4 (four) hours as needed (Asthma).     esomeprazole (NEXIUM) 40 MG capsule Take 40 mg by mouth daily.     etonogestrel  (NEXPLANON ) 68 MG IMPL implant 1 each (68 mg total) by Subdermal route once for 1 dose. 1 each 0   sucralfate  (CARAFATE ) 1 g tablet Take 1 tablet (1 g total) by mouth 4 (four) times daily. 120 tablet 0   No current facility-administered medications for this visit.    OBJECTIVE: Vitals:   06/05/24 1431  BP: (!) 142/78  Pulse: 95  Resp: 18  Temp: 99 F (37.2 C)  SpO2: 100%     Body mass index is 45.35 kg/m.    ECOG FS:0 - Asymptomatic  General: Well-developed, well-nourished, no acute distress. Eyes: Pink conjunctiva, anicteric sclera. HEENT: Normocephalic, moist mucous membranes. Lungs: No audible wheezing or coughing. Heart: Regular rate and rhythm. Abdomen: Soft, nontender, no obvious distention. Musculoskeletal: No edema, cyanosis, or clubbing. Neuro: Alert, answering all questions appropriately. Cranial nerves grossly intact. Skin: No rashes or petechiae noted. Psych: Normal affect.  LAB RESULTS:  Lab Results  Component Value Date   NA 140 12/29/2020    K 3.1 (L) 12/29/2020   CL 105 12/29/2020   CO2 24 12/29/2020   GLUCOSE 121 (H) 12/29/2020   BUN 8 12/29/2020   CREATININE 0.57 12/29/2020   CALCIUM 8.6 (L) 12/29/2020   PROT 7.2 12/29/2020   ALBUMIN 3.8 12/29/2020   AST 17 12/29/2020   ALT 17 12/29/2020   ALKPHOS 57 12/29/2020   BILITOT 0.8 12/29/2020   GFRNONAA >60 12/29/2020   GFRAA >60 08/15/2020    Lab Results  Component Value Date   WBC 12.3 (H) 06/04/2024   NEUTROABS 9.2 (H) 06/04/2024   HGB 12.7 06/04/2024   HCT 37.9 06/04/2024   MCV 87.1 06/04/2024   PLT 319 06/04/2024   Lab Results  Component Value Date   IRON  39 06/04/2024   TIBC 409 06/04/2024   IRONPCTSAT 10 (L) 06/04/2024  Lab Results  Component Value Date   FERRITIN 17 06/04/2024     STUDIES: No results found.  ASSESSMENT: Iron  deficiency anemia.  PLAN:    Iron  deficiency anemia:  Possibly secondary to history of gastritis and heavy menses.  Patient's hemoglobin continues to be within normal limits, but her iron  saturation and ferritin level have slightly declined.  She is mildly symptomatic with fatigue.  She cannot tolerate oral iron  supplementation.  Proceed with 200 mg IV Venofer  today.  Patient does not require a additional treatments.  Return to clinic in 4 months with repeat laboratory, further evaluation, and consideration of IV Venofer  if necessary.   Leukocytosis: Chronic and unchanged.  Peripheral blood flow cytometry from today is pending at time of dictation.   B12 deficiency: Resolved. Hypertension: Patient's blood pressure is mildly elevated today.  Continue monitoring and treatment per primary care.   Patient expressed understanding and was in agreement with this plan. She also understands that She can call clinic at any time with any questions, concerns, or complaints.    Shellie Dials, MD   06/05/2024 2:52 PM

## 2024-06-10 LAB — COMP PANEL: LEUKEMIA/LYMPHOMA

## 2024-10-07 ENCOUNTER — Other Ambulatory Visit: Payer: Self-pay | Admitting: *Deleted

## 2024-10-07 DIAGNOSIS — D509 Iron deficiency anemia, unspecified: Secondary | ICD-10-CM

## 2024-10-08 ENCOUNTER — Inpatient Hospital Stay: Attending: Oncology

## 2024-10-08 DIAGNOSIS — Z800 Family history of malignant neoplasm of digestive organs: Secondary | ICD-10-CM | POA: Diagnosis not present

## 2024-10-08 DIAGNOSIS — Z803 Family history of malignant neoplasm of breast: Secondary | ICD-10-CM | POA: Diagnosis not present

## 2024-10-08 DIAGNOSIS — E538 Deficiency of other specified B group vitamins: Secondary | ICD-10-CM | POA: Insufficient documentation

## 2024-10-08 DIAGNOSIS — D72829 Elevated white blood cell count, unspecified: Secondary | ICD-10-CM | POA: Insufficient documentation

## 2024-10-08 DIAGNOSIS — D509 Iron deficiency anemia, unspecified: Secondary | ICD-10-CM | POA: Insufficient documentation

## 2024-10-08 LAB — CBC WITH DIFFERENTIAL/PLATELET
Abs Immature Granulocytes: 0.06 K/uL (ref 0.00–0.07)
Basophils Absolute: 0.1 K/uL (ref 0.0–0.1)
Basophils Relative: 1 %
Eosinophils Absolute: 0.3 K/uL (ref 0.0–0.5)
Eosinophils Relative: 3 %
HCT: 40.2 % (ref 36.0–46.0)
Hemoglobin: 13.2 g/dL (ref 12.0–15.0)
Immature Granulocytes: 1 %
Lymphocytes Relative: 18 %
Lymphs Abs: 2.1 K/uL (ref 0.7–4.0)
MCH: 28.6 pg (ref 26.0–34.0)
MCHC: 32.8 g/dL (ref 30.0–36.0)
MCV: 87.2 fL (ref 80.0–100.0)
Monocytes Absolute: 0.5 K/uL (ref 0.1–1.0)
Monocytes Relative: 4 %
Neutro Abs: 9.1 K/uL — ABNORMAL HIGH (ref 1.7–7.7)
Neutrophils Relative %: 73 %
Platelets: 343 K/uL (ref 150–400)
RBC: 4.61 MIL/uL (ref 3.87–5.11)
RDW: 13.1 % (ref 11.5–15.5)
WBC: 12.2 K/uL — ABNORMAL HIGH (ref 4.0–10.5)
nRBC: 0 % (ref 0.0–0.2)

## 2024-10-08 LAB — IRON AND TIBC
Iron: 51 ug/dL (ref 28–170)
Saturation Ratios: 14 % (ref 10.4–31.8)
TIBC: 361 ug/dL (ref 250–450)
UIBC: 310 ug/dL

## 2024-10-08 LAB — FERRITIN: Ferritin: 26 ng/mL (ref 11–307)

## 2024-10-09 ENCOUNTER — Inpatient Hospital Stay (HOSPITAL_BASED_OUTPATIENT_CLINIC_OR_DEPARTMENT_OTHER): Admitting: Oncology

## 2024-10-09 ENCOUNTER — Encounter: Payer: Self-pay | Admitting: Oncology

## 2024-10-09 ENCOUNTER — Inpatient Hospital Stay

## 2024-10-09 VITALS — BP 125/86 | HR 79 | Temp 98.5°F | Resp 18 | Ht 63.0 in | Wt 257.0 lb

## 2024-10-09 DIAGNOSIS — D509 Iron deficiency anemia, unspecified: Secondary | ICD-10-CM

## 2024-10-09 NOTE — Progress Notes (Signed)
 Patient is doing ok, she told me that she can tell whenever her iron  is getting low, she starts to get ill and doesn't want to be touched or around people.

## 2024-10-09 NOTE — Progress Notes (Signed)
 [facility]  Telephone:(336) [phone] [phone]) [phone]  ID: Kimberly Mcfarland OB: 1981-02-26  MR#: [id number]  RDW#:[id number]  Patient Care Team: Sadie Manna, MD as PCP - General (Internal Medicine) Nonnie Suzen MATSU, DO as Consulting Physician (Family Medicine) Jacobo Evalene PARAS, MD as Consulting Physician (Oncology)  CHIEF COMPLAINT: Iron  deficiency anemia.  INTERVAL HISTORY: Patient returns to clinic today for repeat laboratory, further evaluation, and consideration of IV iron .  She currently feels well and is asymptomatic.  She does not complain of any weakness or fatigue today.  She continues to be active and work full-time.  She has no neurologic complaints.  She denies any recent fevers or illnesses.  She has a good appetite and denies weight loss.  She has no chest pain, shortness of breath, cough, or hemoptysis.  She denies any nausea, vomiting, constipation, or diarrhea.  She has no melena or hematochezia.  She has no urinary complaints.  Patient offers no specific complaints today.  REVIEW OF SYSTEMS:   Review of Systems  Constitutional: Negative.  Negative for fever, malaise/fatigue and weight loss.  Respiratory: Negative.  Negative for cough, hemoptysis and shortness of breath.   Cardiovascular: Negative.  Negative for chest pain and leg swelling.  Gastrointestinal:  Negative for abdominal pain, blood in stool and melena.  Genitourinary: Negative.  Negative for hematuria.  Musculoskeletal: Negative.  Negative for back pain.  Skin: Negative.  Negative for rash.  Neurological: Negative.  Negative for dizziness, focal weakness and weakness.  Psychiatric/Behavioral: Negative.  The patient is not nervous/anxious.     As per HPI. Otherwise, a complete review of systems is negative.  PAST MEDICAL HISTORY: Past Medical History:  Diagnosis Date   Anemia    after pregnancy   Asthma    well controlled-has not used inhaler in years   Complication of  anesthesia    difficulty voiding postop   Family history of adverse reaction to anesthesia    mom woke up during surgery   Family history of hemophilia 05/04/2014   patient has never been tested   GERD (gastroesophageal reflux disease)    Heart murmur    as child   Hypothyroid    h/o outgrew   IDA (iron  deficiency anemia) 04/27/2020   Morbid obesity (HCC)    Multiple large sebaceous cysts of scalp 05/02/2018   PCOS (polycystic ovarian syndrome)    Sleep apnea     PAST SURGICAL HISTORY: Past Surgical History:  Procedure Laterality Date   CHOLECYSTECTOMY N/A 03/08/2018   Procedure: LAPAROSCOPIC CHOLECYSTECTOMY;  Surgeon: Nicholaus Selinda Birmingham, MD;  Location: [facility];  Service: General;  Laterality: N/A;   CYST EXCISION  2010   head cysts x 14.  high levels of keratin   CYST EXCISION N/A 04/07/2024   Procedure: CYST REMOVAL;  Surgeon: Rodolph Romano, MD;  Location: [facility];  Service: General;  Laterality: N/A;  x 3   LESION EXCISION N/A 02/16/2016   Procedure: EXCISION SCALP LESION,excision 2 large scalp cysrts with intermediate closure;  Surgeon: Louanne MATSU Muse, MD;  Location: [facility];  Service: General;  Laterality: N/A;   LESION EXCISION N/A 05/13/2018   Procedure: EXCISION SCALP LESION;  Surgeon: Nicholaus Selinda Birmingham, MD;  Location: [facility];  Service: General;  Laterality: N/A;   WISDOM TOOTH EXTRACTION      FAMILY HISTORY: Family History  Problem Relation Age of Onset   Diabetes Mother    Asthma Mother    Obesity Mother    Hypertension Mother  Hemophilia Mother    Heart disease Father    Heart attack Father    Testicular cancer Father    Breast cancer Other    Diabetes Maternal Grandmother        type 2   Factor VIII deficiency Other        Also has factor 9 deficiency   Factor VIII deficiency Other        also has factor 9 deficiency   Hemophilia Maternal Grandfather    Esophageal cancer Maternal Grandfather    Pancreatic cancer Paternal Grandmother      ADVANCED DIRECTIVES (Y/N):  N  HEALTH MAINTENANCE: Social History   Tobacco Use   Smoking status: Never   Smokeless tobacco: Never  Vaping Use   Vaping status: Never Used  Substance Use Topics   Alcohol use: No    Alcohol/week: 0.0 standard drinks of alcohol   Drug use: No     Colonoscopy:  PAP:  Bone density:  Lipid panel:  Allergies  Allergen Reactions   Sulfa Antibiotics Other (See Comments)    seizures   Levaquin [Levofloxacin In D5w] Hives   Other Hives    dermabond   Penicillins Hives   Pepcid  [Famotidine ] Other (See Comments)    Dizziness   Oxycodone  Itching and Rash    Current Outpatient Medications  Medication Sig Dispense Refill   acetaminophen  (TYLENOL ) 500 MG tablet Take 1,000 mg by mouth every 6 (six) hours as needed for fever or headache.      albuterol  (VENTOLIN  HFA) 108 (90 Base) MCG/ACT inhaler Inhale 2 puffs into the lungs every 4 (four) hours as needed (Asthma).     esomeprazole (NEXIUM) 40 MG capsule Take 40 mg by mouth daily.     etonogestrel  (NEXPLANON ) 68 MG IMPL implant 1 each (68 mg total) by Subdermal route once for 1 dose. 1 each 0   sucralfate  (CARAFATE ) 1 g tablet Take 1 tablet (1 g total) by mouth 4 (four) times daily. 120 tablet 0   No current facility-administered medications for this visit.    OBJECTIVE: Vitals:   10/09/24 1434  BP: 125/86  Pulse: 79  Resp: 18  Temp: 98.5 F (36.9 C)  SpO2: 99%     Body mass index is 45.53 kg/m.    ECOG FS:0 - Asymptomatic  General: Well-developed, well-nourished, no acute distress. Eyes: Pink conjunctiva, anicteric sclera. HEENT: Normocephalic, moist mucous membranes. Lungs: No audible wheezing or coughing. Heart: Regular rate and rhythm. Abdomen: Soft, nontender, no obvious distention. Musculoskeletal: No edema, cyanosis, or clubbing. Neuro: Alert, answering all questions appropriately. Cranial nerves grossly intact. Skin: No rashes or petechiae noted. Psych: Normal  affect.  LAB RESULTS:  Lab Results  Component Value Date   NA 140 12/29/2020   K 3.1 (L) 12/29/2020   CL 105 12/29/2020   CO2 24 12/29/2020   GLUCOSE 121 (H) 12/29/2020   BUN 8 12/29/2020   CREATININE 0.57 12/29/2020   CALCIUM 8.6 (L) 12/29/2020   PROT 7.2 12/29/2020   ALBUMIN 3.8 12/29/2020   AST 17 12/29/2020   ALT 17 12/29/2020   ALKPHOS 57 12/29/2020   BILITOT 0.8 12/29/2020   GFRNONAA >60 12/29/2020   GFRAA >60 08/15/2020    Lab Results  Component Value Date   WBC 12.2 (H) 10/08/2024   NEUTROABS 9.1 (H) 10/08/2024   HGB 13.2 10/08/2024   HCT 40.2 10/08/2024   MCV 87.2 10/08/2024   PLT 343 10/08/2024   Lab Results  Component Value Date  IRON  51 10/08/2024   TIBC 361 10/08/2024   IRONPCTSAT 14 10/08/2024   Lab Results  Component Value Date   FERRITIN 26 10/08/2024     STUDIES: No results found.  ASSESSMENT: Iron  deficiency anemia.  PLAN:    Iron  deficiency anemia: Resolved.  Possibly secondary to history of gastritis and heavy menses which patient reports are getting more erratic and she feels like she may be entering menopause.  Hemoglobin iron  stores are now within normal limits.  She does not require additional IV Feraheme today.  She last received treatment on June 05, 2024.  No intervention is needed.  Return to clinic in 4 months with repeat laboratory, further evaluation, and continuation of treatment if needed. Leukocytosis: Chronic and unchanged.  Previously, peripheral blood flow cytometry was negative. B12 deficiency: Resolved. Hypertension: His blood pressure is within normal limits today.  Continue monitoring and treatment per primary care.  Patient expressed understanding and was in agreement with this plan. She also understands that She can call clinic at any time with any questions, concerns, or complaints.    Evalene JINNY Reusing, MD   10/09/2024 3:17 PM

## 2025-01-05 ENCOUNTER — Other Ambulatory Visit: Payer: Self-pay | Admitting: Internal Medicine

## 2025-01-05 DIAGNOSIS — Z1231 Encounter for screening mammogram for malignant neoplasm of breast: Secondary | ICD-10-CM

## 2025-01-19 ENCOUNTER — Encounter

## 2025-01-28 ENCOUNTER — Encounter

## 2025-02-09 ENCOUNTER — Inpatient Hospital Stay

## 2025-02-10 ENCOUNTER — Inpatient Hospital Stay

## 2025-02-10 ENCOUNTER — Inpatient Hospital Stay: Admitting: Oncology

## 2025-02-17 ENCOUNTER — Encounter
